# Patient Record
Sex: Male | Born: 1960 | Race: White | Hispanic: No | Marital: Married | Smoking: Never smoker
Health system: Southern US, Community
[De-identification: ages and names within clinical notes are randomized; demographics above are authoritative.]

## PROBLEM LIST (undated history)

## (undated) DIAGNOSIS — D869 Sarcoidosis, unspecified: Secondary | ICD-10-CM

## (undated) DIAGNOSIS — G4733 Obstructive sleep apnea (adult) (pediatric): Secondary | ICD-10-CM

## (undated) DIAGNOSIS — T148XXA Other injury of unspecified body region, initial encounter: Secondary | ICD-10-CM

## (undated) DIAGNOSIS — IMO0001 Reserved for inherently not codable concepts without codable children: Secondary | ICD-10-CM

## (undated) DIAGNOSIS — Z87442 Personal history of urinary calculi: Secondary | ICD-10-CM

## (undated) DIAGNOSIS — G43909 Migraine, unspecified, not intractable, without status migrainosus: Secondary | ICD-10-CM

## (undated) DIAGNOSIS — N200 Calculus of kidney: Secondary | ICD-10-CM

## (undated) DIAGNOSIS — F909 Attention-deficit hyperactivity disorder, unspecified type: Secondary | ICD-10-CM

## (undated) DIAGNOSIS — I10 Essential (primary) hypertension: Secondary | ICD-10-CM

## (undated) DIAGNOSIS — T8859XA Other complications of anesthesia, initial encounter: Secondary | ICD-10-CM

## (undated) DIAGNOSIS — Z531 Procedure and treatment not carried out because of patient's decision for reasons of belief and group pressure: Secondary | ICD-10-CM

## (undated) DIAGNOSIS — T4145XA Adverse effect of unspecified anesthetic, initial encounter: Secondary | ICD-10-CM

## (undated) HISTORY — DX: Migraine, unspecified, not intractable, without status migrainosus: G43.909

## (undated) HISTORY — DX: Procedure and treatment not carried out because of patient's decision for reasons of belief and group pressure: Z53.1

## (undated) HISTORY — PX: KIDNEY STONE SURGERY: SHX686

## (undated) HISTORY — PX: KNEE SURGERY: SHX244

## (undated) HISTORY — PX: OTHER SURGICAL HISTORY: SHX169

## (undated) HISTORY — DX: Other injury of unspecified body region, initial encounter: T14.8XXA

## (undated) HISTORY — DX: Obstructive sleep apnea (adult) (pediatric): G47.33

## (undated) HISTORY — DX: Attention-deficit hyperactivity disorder, unspecified type: F90.9

## (undated) HISTORY — DX: Calculus of kidney: N20.0

## (undated) HISTORY — PX: HERNIA REPAIR: SHX51

## (undated) HISTORY — DX: Reserved for inherently not codable concepts without codable children: IMO0001

## (undated) HISTORY — DX: Sarcoidosis, unspecified: D86.9

## (undated) HISTORY — PX: COLONOSCOPY: SHX5424

---

## 2004-12-19 HISTORY — PX: BRONCHOSCOPY: SUR163

## 2005-08-17 ENCOUNTER — Ambulatory Visit: Payer: Self-pay | Admitting: Internal Medicine

## 2005-09-07 ENCOUNTER — Ambulatory Visit: Payer: Self-pay | Admitting: Pulmonary Disease

## 2005-09-19 ENCOUNTER — Ambulatory Visit: Payer: Self-pay | Admitting: Cardiology

## 2005-09-26 ENCOUNTER — Ambulatory Visit: Payer: Self-pay | Admitting: Pulmonary Disease

## 2005-09-28 ENCOUNTER — Ambulatory Visit (HOSPITAL_COMMUNITY): Admission: RE | Admit: 2005-09-28 | Discharge: 2005-09-28 | Payer: Self-pay | Admitting: Pulmonary Disease

## 2005-09-28 ENCOUNTER — Encounter (INDEPENDENT_AMBULATORY_CARE_PROVIDER_SITE_OTHER): Payer: Self-pay | Admitting: Specialist

## 2005-10-17 ENCOUNTER — Ambulatory Visit: Payer: Self-pay | Admitting: Pulmonary Disease

## 2005-10-26 ENCOUNTER — Ambulatory Visit: Payer: Self-pay | Admitting: Pulmonary Disease

## 2006-01-25 ENCOUNTER — Ambulatory Visit: Payer: Self-pay | Admitting: Pulmonary Disease

## 2006-03-22 ENCOUNTER — Emergency Department (HOSPITAL_COMMUNITY): Admission: EM | Admit: 2006-03-22 | Discharge: 2006-03-22 | Payer: Self-pay | Admitting: *Deleted

## 2006-05-05 ENCOUNTER — Ambulatory Visit: Payer: Self-pay | Admitting: Pulmonary Disease

## 2006-09-27 ENCOUNTER — Ambulatory Visit: Payer: Self-pay | Admitting: Pulmonary Disease

## 2006-12-04 ENCOUNTER — Ambulatory Visit: Payer: Self-pay | Admitting: Pulmonary Disease

## 2006-12-11 ENCOUNTER — Ambulatory Visit: Payer: Self-pay | Admitting: Family Medicine

## 2006-12-13 ENCOUNTER — Ambulatory Visit: Payer: Self-pay | Admitting: Internal Medicine

## 2007-02-21 ENCOUNTER — Ambulatory Visit: Payer: Self-pay | Admitting: Pulmonary Disease

## 2007-03-08 ENCOUNTER — Ambulatory Visit: Payer: Self-pay | Admitting: Cardiology

## 2007-04-03 ENCOUNTER — Ambulatory Visit: Payer: Self-pay | Admitting: Internal Medicine

## 2007-04-17 ENCOUNTER — Ambulatory Visit: Payer: Self-pay | Admitting: Pulmonary Disease

## 2007-05-08 ENCOUNTER — Ambulatory Visit: Payer: Self-pay | Admitting: Pulmonary Disease

## 2007-06-11 ENCOUNTER — Ambulatory Visit: Payer: Self-pay | Admitting: Pulmonary Disease

## 2007-08-30 DIAGNOSIS — D869 Sarcoidosis, unspecified: Secondary | ICD-10-CM

## 2007-08-30 DIAGNOSIS — G43909 Migraine, unspecified, not intractable, without status migrainosus: Secondary | ICD-10-CM

## 2007-08-30 HISTORY — DX: Migraine, unspecified, not intractable, without status migrainosus: G43.909

## 2007-09-03 ENCOUNTER — Ambulatory Visit: Payer: Self-pay | Admitting: Internal Medicine

## 2007-09-03 DIAGNOSIS — R9431 Abnormal electrocardiogram [ECG] [EKG]: Secondary | ICD-10-CM

## 2007-09-03 DIAGNOSIS — Z87442 Personal history of urinary calculi: Secondary | ICD-10-CM | POA: Insufficient documentation

## 2007-09-03 DIAGNOSIS — F988 Other specified behavioral and emotional disorders with onset usually occurring in childhood and adolescence: Secondary | ICD-10-CM | POA: Insufficient documentation

## 2007-09-03 DIAGNOSIS — R030 Elevated blood-pressure reading, without diagnosis of hypertension: Secondary | ICD-10-CM | POA: Insufficient documentation

## 2007-09-06 ENCOUNTER — Ambulatory Visit: Payer: Self-pay | Admitting: Family Medicine

## 2007-09-07 ENCOUNTER — Ambulatory Visit: Payer: Self-pay | Admitting: Internal Medicine

## 2007-09-08 ENCOUNTER — Encounter: Payer: Self-pay | Admitting: Internal Medicine

## 2007-09-08 ENCOUNTER — Ambulatory Visit: Payer: Self-pay | Admitting: Family Medicine

## 2007-09-12 ENCOUNTER — Ambulatory Visit: Payer: Self-pay

## 2007-09-12 ENCOUNTER — Encounter: Payer: Self-pay | Admitting: Internal Medicine

## 2007-09-14 ENCOUNTER — Encounter: Payer: Self-pay | Admitting: Internal Medicine

## 2007-10-04 DIAGNOSIS — J310 Chronic rhinitis: Secondary | ICD-10-CM | POA: Insufficient documentation

## 2007-10-08 ENCOUNTER — Ambulatory Visit: Payer: Self-pay | Admitting: Pulmonary Disease

## 2007-10-10 ENCOUNTER — Ambulatory Visit: Payer: Self-pay | Admitting: Internal Medicine

## 2007-10-31 ENCOUNTER — Ambulatory Visit: Payer: Self-pay | Admitting: Internal Medicine

## 2007-11-23 ENCOUNTER — Ambulatory Visit: Payer: Self-pay | Admitting: Internal Medicine

## 2007-11-23 DIAGNOSIS — L609 Nail disorder, unspecified: Secondary | ICD-10-CM | POA: Insufficient documentation

## 2007-11-30 LAB — CONVERTED CEMR LAB
Albumin: 4.2 g/dL (ref 3.5–5.2)
Alkaline Phosphatase: 61 units/L (ref 39–117)
Basophils Absolute: 0 10*3/uL (ref 0.0–0.1)
Bilirubin, Direct: 0.3 mg/dL (ref 0.0–0.3)
Chloride: 99 meq/L (ref 96–112)
Cholesterol: 206 mg/dL (ref 0–200)
Creatinine, Ser: 0.9 mg/dL (ref 0.4–1.5)
GFR calc Af Amer: 118 mL/min
Glucose, Bld: 71 mg/dL (ref 70–99)
Hemoglobin: 15.9 g/dL (ref 13.0–17.0)
MCV: 90.2 fL (ref 78.0–100.0)
Neutro Abs: 3.7 10*3/uL (ref 1.4–7.7)
Platelets: 310 10*3/uL (ref 150–400)
Sodium: 139 meq/L (ref 135–145)
Total CHOL/HDL Ratio: 4.2
Triglycerides: 102 mg/dL (ref 0–149)
WBC: 5.5 10*3/uL (ref 4.5–10.5)

## 2007-12-03 ENCOUNTER — Telehealth: Payer: Self-pay | Admitting: Internal Medicine

## 2008-01-24 ENCOUNTER — Telehealth: Payer: Self-pay | Admitting: Internal Medicine

## 2008-04-18 ENCOUNTER — Telehealth: Payer: Self-pay | Admitting: Internal Medicine

## 2008-06-19 ENCOUNTER — Encounter: Payer: Self-pay | Admitting: Internal Medicine

## 2008-07-04 ENCOUNTER — Ambulatory Visit: Payer: Self-pay | Admitting: Internal Medicine

## 2008-07-04 ENCOUNTER — Telehealth: Payer: Self-pay | Admitting: Internal Medicine

## 2008-07-04 DIAGNOSIS — R29898 Other symptoms and signs involving the musculoskeletal system: Secondary | ICD-10-CM

## 2008-08-06 ENCOUNTER — Encounter: Payer: Self-pay | Admitting: Internal Medicine

## 2008-08-28 ENCOUNTER — Telehealth: Payer: Self-pay | Admitting: Internal Medicine

## 2008-09-02 ENCOUNTER — Ambulatory Visit: Payer: Self-pay | Admitting: Internal Medicine

## 2008-10-09 ENCOUNTER — Telehealth: Payer: Self-pay | Admitting: *Deleted

## 2008-10-21 ENCOUNTER — Telehealth (INDEPENDENT_AMBULATORY_CARE_PROVIDER_SITE_OTHER): Payer: Self-pay | Admitting: *Deleted

## 2008-10-24 ENCOUNTER — Ambulatory Visit: Payer: Self-pay | Admitting: Pulmonary Disease

## 2008-11-26 ENCOUNTER — Telehealth: Payer: Self-pay | Admitting: Internal Medicine

## 2008-12-23 ENCOUNTER — Emergency Department (HOSPITAL_COMMUNITY): Admission: EM | Admit: 2008-12-23 | Discharge: 2008-12-23 | Payer: Self-pay | Admitting: Emergency Medicine

## 2009-01-04 ENCOUNTER — Emergency Department (HOSPITAL_COMMUNITY): Admission: EM | Admit: 2009-01-04 | Discharge: 2009-01-04 | Payer: Self-pay | Admitting: Emergency Medicine

## 2009-01-16 ENCOUNTER — Telehealth: Payer: Self-pay | Admitting: *Deleted

## 2009-02-26 ENCOUNTER — Ambulatory Visit: Payer: Self-pay | Admitting: Internal Medicine

## 2009-05-11 ENCOUNTER — Telehealth: Payer: Self-pay | Admitting: Internal Medicine

## 2009-07-15 ENCOUNTER — Telehealth: Payer: Self-pay | Admitting: Internal Medicine

## 2009-09-09 ENCOUNTER — Telehealth: Payer: Self-pay | Admitting: Internal Medicine

## 2009-11-05 ENCOUNTER — Telehealth: Payer: Self-pay | Admitting: *Deleted

## 2009-11-10 ENCOUNTER — Ambulatory Visit: Payer: Self-pay | Admitting: Internal Medicine

## 2010-01-04 ENCOUNTER — Ambulatory Visit: Payer: Self-pay | Admitting: Pulmonary Disease

## 2010-01-12 ENCOUNTER — Telehealth: Payer: Self-pay | Admitting: Internal Medicine

## 2010-01-28 ENCOUNTER — Ambulatory Visit: Payer: Self-pay | Admitting: Family Medicine

## 2010-02-24 ENCOUNTER — Ambulatory Visit: Payer: Self-pay | Admitting: Pulmonary Disease

## 2010-03-03 ENCOUNTER — Ambulatory Visit: Payer: Self-pay | Admitting: Internal Medicine

## 2010-03-03 LAB — CONVERTED CEMR LAB
Albumin: 4.3 g/dL (ref 3.5–5.2)
BUN: 14 mg/dL (ref 6–23)
Basophils Absolute: 0 10*3/uL (ref 0.0–0.1)
Basophils Relative: 0.1 % (ref 0.0–3.0)
Bilirubin, Direct: 0.1 mg/dL (ref 0.0–0.3)
Blood in Urine, dipstick: NEGATIVE
Chloride: 106 meq/L (ref 96–112)
Direct LDL: 128.4 mg/dL
Eosinophils Absolute: 0 10*3/uL (ref 0.0–0.7)
Glucose, Urine, Semiquant: NEGATIVE
HCT: 47.9 % (ref 39.0–52.0)
Lymphs Abs: 1 10*3/uL (ref 0.7–4.0)
MCHC: 33.1 g/dL (ref 30.0–36.0)
Monocytes Relative: 5.6 % (ref 3.0–12.0)
Neutrophils Relative %: 81.9 % — ABNORMAL HIGH (ref 43.0–77.0)
Potassium: 4.2 meq/L (ref 3.5–5.1)
Sodium: 144 meq/L (ref 135–145)
Specific Gravity, Urine: 1.015
Total CHOL/HDL Ratio: 3
Total Protein: 7 g/dL (ref 6.0–8.3)
Triglycerides: 112 mg/dL (ref 0.0–149.0)
pH: 7

## 2010-03-10 ENCOUNTER — Ambulatory Visit: Payer: Self-pay | Admitting: Internal Medicine

## 2010-03-15 ENCOUNTER — Ambulatory Visit: Payer: Self-pay | Admitting: Pulmonary Disease

## 2010-04-19 ENCOUNTER — Ambulatory Visit: Payer: Self-pay | Admitting: Pulmonary Disease

## 2010-04-19 ENCOUNTER — Telehealth: Payer: Self-pay | Admitting: Pulmonary Disease

## 2010-05-07 ENCOUNTER — Emergency Department (HOSPITAL_COMMUNITY): Admission: EM | Admit: 2010-05-07 | Discharge: 2010-05-07 | Payer: Self-pay | Admitting: Emergency Medicine

## 2010-05-10 ENCOUNTER — Emergency Department (HOSPITAL_COMMUNITY): Admission: EM | Admit: 2010-05-10 | Discharge: 2010-05-10 | Payer: Self-pay | Admitting: Emergency Medicine

## 2010-05-18 ENCOUNTER — Ambulatory Visit: Payer: Self-pay | Admitting: Pulmonary Disease

## 2010-05-18 DIAGNOSIS — G4733 Obstructive sleep apnea (adult) (pediatric): Secondary | ICD-10-CM | POA: Insufficient documentation

## 2010-05-20 ENCOUNTER — Telehealth (INDEPENDENT_AMBULATORY_CARE_PROVIDER_SITE_OTHER): Payer: Self-pay | Admitting: *Deleted

## 2010-05-24 ENCOUNTER — Ambulatory Visit (HOSPITAL_BASED_OUTPATIENT_CLINIC_OR_DEPARTMENT_OTHER): Admission: RE | Admit: 2010-05-24 | Discharge: 2010-05-24 | Payer: Self-pay | Admitting: Urology

## 2010-06-01 ENCOUNTER — Telehealth: Payer: Self-pay | Admitting: *Deleted

## 2010-06-15 ENCOUNTER — Ambulatory Visit: Payer: Self-pay | Admitting: Pulmonary Disease

## 2010-07-02 ENCOUNTER — Encounter: Payer: Self-pay | Admitting: Pulmonary Disease

## 2010-07-07 ENCOUNTER — Ambulatory Visit: Payer: Self-pay | Admitting: Pulmonary Disease

## 2010-07-13 ENCOUNTER — Telehealth: Payer: Self-pay | Admitting: Pulmonary Disease

## 2010-07-13 ENCOUNTER — Ambulatory Visit: Payer: Self-pay | Admitting: Pulmonary Disease

## 2010-07-14 LAB — CONVERTED CEMR LAB
ALT: 24 units/L (ref 0–53)
Albumin: 4.1 g/dL (ref 3.5–5.2)
Creatinine, Ser: 1.1 mg/dL (ref 0.4–1.5)
HCT: 40.4 % (ref 39.0–52.0)
Hemoglobin: 14.2 g/dL (ref 13.0–17.0)
Lymphs Abs: 1.1 10*3/uL (ref 0.7–4.0)
Neutrophils Relative %: 66.2 % (ref 43.0–77.0)
Platelets: 314 10*3/uL (ref 150.0–400.0)
Potassium: 4.2 meq/L (ref 3.5–5.1)
RBC: 4.48 M/uL (ref 4.22–5.81)
RDW: 13.3 % (ref 11.5–14.6)
Total Protein: 6.6 g/dL (ref 6.0–8.3)
WBC: 5.7 10*3/uL (ref 4.5–10.5)

## 2010-07-20 ENCOUNTER — Telehealth: Payer: Self-pay | Admitting: Internal Medicine

## 2010-08-24 ENCOUNTER — Telehealth: Payer: Self-pay | Admitting: Internal Medicine

## 2010-08-24 ENCOUNTER — Ambulatory Visit: Payer: Self-pay | Admitting: Pulmonary Disease

## 2010-09-22 ENCOUNTER — Telehealth: Payer: Self-pay | Admitting: Internal Medicine

## 2010-10-27 ENCOUNTER — Telehealth: Payer: Self-pay | Admitting: Internal Medicine

## 2010-10-28 ENCOUNTER — Ambulatory Visit: Payer: Self-pay | Admitting: Internal Medicine

## 2010-11-28 ENCOUNTER — Observation Stay (HOSPITAL_COMMUNITY)
Admission: EM | Admit: 2010-11-28 | Discharge: 2010-11-30 | Payer: Self-pay | Source: Home / Self Care | Attending: Internal Medicine | Admitting: Internal Medicine

## 2010-11-29 ENCOUNTER — Telehealth: Payer: Self-pay | Admitting: Pulmonary Disease

## 2010-11-29 ENCOUNTER — Encounter: Payer: Self-pay | Admitting: Internal Medicine

## 2010-11-29 ENCOUNTER — Encounter: Payer: Self-pay | Admitting: Pulmonary Disease

## 2010-11-29 ENCOUNTER — Encounter (INDEPENDENT_AMBULATORY_CARE_PROVIDER_SITE_OTHER): Payer: Self-pay | Admitting: Internal Medicine

## 2010-12-01 ENCOUNTER — Telehealth: Payer: Self-pay | Admitting: Pulmonary Disease

## 2010-12-14 ENCOUNTER — Telehealth: Payer: Self-pay | Admitting: Internal Medicine

## 2011-01-18 NOTE — Assessment & Plan Note (Signed)
Summary: rov 2-3 months///kp   Primary Provider/Referring Provider:  [name]  CC:  rov F/U 2-3, mos, no new problems, cough is better since last visit with Dr Shelle Iron about 3 weeks ago, and at that time prednisine dosage was adjusted.Barry Silva  History of Present Illness: 50 yo male with cough in setting of sarcoidosis on chronic prednisone.  He has done better since he had his prednisone dose increased.  He is now on prednisone 7.5 mg once daily, and has been for the past 5 days.  He still has a cough, but not as bad as before.  He has been sleeping better also.  He denies sinus problems, sore throat, fever, wheeze, sputum, hemoptysis, chest pain, gland swelling, or skin rashes.  He did have the flu since he saw me last.  This was associated with several canker sores in his mouth.  He has not had this happen before, and denies having these types of sores anywhere else on his body.  He was treated with tamiflu and magic mouthwash by primary care from February 10.  Labs from March 16 were reviewed.  Current Medications (verified): 1)  Prednisone 2.5 Mg Tabs (Prednisone) .... One By Mouth Every Other Day 2)  Adderall Xr 20 Mg  Cp24 (Amphetamine-Dextroamphetamine) .... 2 By Mouth Once Daily 3)  Prednisone 7.5 Mg Tabs (Prednisone) .... As Directed  Allergies (verified): No Known Drug Allergies  Past History:  Past Medical History: migraine headache sarcoidosis bronchoscopy 2006 [employer] Witness Nephrolithiasis, hx of  episode Jan 2010.  Calcium stones ADD formal evaluaton  CONSULTANTS  [name]  Past Surgical History: Reviewed history from 11/23/2007 and no changes required. 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   Nose:  no deformity, discharge, inflammation, or lesions Mouth:  no deformity or lesions Neck:  No deformities, masses, or tenderness noted. Lungs:  clear bilaterally to auscultation and percussion Heart:  regular rate and rhythm, S1, S2 without murmurs, rubs, gallops, or clicks Extremities:  no edema or cyanosis Cervical Nodes:  no significant adenopathy   Impression & Recommendations:  Problem # 1:  COUGH, CHRONIC (ICD-786.2)  He has improved with increase in prednisone.  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  Problem # 2:  SARCOIDOSIS (ICD-135) Now that his cough has improved will continue to see if we can taper down his prednisone.  Explained again that the objective is to taper prednisone off completely.  If not able to do this explained that he may need to consider alternative therapies to prednisone.  He has been reluctant to do this in the past.  Medications Added to Medication List This Visit: 1)  Prednisone 5 Mg Tabs (Prednisone) .Barry KitchenMarland KitchenMarland Silva  One by mouth once daily for 2 weeks, then one by mouth every other day  Complete Medication List: 1)  Prednisone 5 Mg Tabs (Prednisone) .... One by mouth once daily for 2 weeks, then one by mouth every other day 2)  Adderall Xr 20 Mg Cp24 (Amphetamine-dextroamphetamine) .... 2 by mouth once daily  Other Orders: Est. Patient Level III ([address])  Patient Instructions: 1)  Prednisone 7.5 mg once daily for 2 days, then 5 mg once daily for 2 weeks, then 5 mg every other day until next visit 2)  Follow up in 4 weeks Prescriptions: PREDNISONE 5 MG TABS (PREDNISONE) one by mouth once daily for 2 weeks, then one by mouth every other day   #30 x 1   Entered and Authorized by:   Coralyn Helling MD   Signed by:   Coralyn Helling MD on 03/15/2010   Method used:   Electronically to        [facility]. [phone]* (retail)       [phone]       [place], [place]  [address]       Ph: [phone]       Fax: [phone]   RxID:   [id number]

## 2011-01-18 NOTE — Assessment & Plan Note (Signed)
Summary: Barry Silva ONLY/CB   Primary Provider/Referring Provider:  [name]   History of Present Illness: Apnealink from 07/01/10.  AHI 1, RDI 7.  Oxygen nadir 83%.  Spent 1% of test time with SpO2 < 88%.  Allergies: No Known Drug Allergies  Past History:  Past Medical History: Migraine headache Sarcoidosis bronchoscopy 2006 on chronic prednisone      - MTX started 04/19/10 [employer] Witness Nephrolithiasis, hx of  episode Jan 2010.  Calcium stones ADD formal evaluaton Mild OSA      - Apnealink 07/01/10 RDI 7, O2 nadir 83%  CONSULTANTS  Talajah Slimp   [place]   Impression & Recommendations:  Problem # 1:  OBSTRUCTIVE SLEEP APNEA (ICD-327.23) Reviewe apnealink results over the phone.  He has mild sleep apnea.  Advised trying positional therapy first.  Also explained the CPAP, oral appliance, or surgery options.  He would be a good candidate for an oral appliance.  He will think about these options and let me know what he decides.  Appended Document: APENA LINK ONLY/CB

## 2011-01-18 NOTE — Assessment & Plan Note (Signed)
Summary: 4 weeks/apc   Primary Provider/Referring Provider:  [name]   History of Present Illness: 50 yo male with cough in setting of sarcoidosis on chronic prednisone.  He had lithotripsy done recently.  As a result he was not able to do his home sleep test.  He continues on methotrexate.  His cough has improved.  He is not having chest congestion, wheeze, sore throat, sinus congestion, fever, sputum, or skin rash.  He had a canker sore on his tongue a few weeks ago, but this resolved.  He says he is prone to getting these.  He continues to have trouble with his sleep.    Current Medications (verified): 1)  Adderall Xr 20 Mg  Cp24 (Amphetamine-Dextroamphetamine) .... 2 By Mouth Once Daily 2)  Prednisone 5 Mg Tabs (Prednisone) .[name].. 1 By Mouth Every Other Day For Two Weeks, Then 1/2 Pill Every Other Day 3)  Methotrexate 2.5 Mg Tabs (Methotrexate Sodium) .... Three Pills Once Per Week 4)  Folic Acid 1 Mg Tabs (Folic Acid) .... One By Mouth Once Daily  Allergies (verified): No Known Drug Allergies  Comments:  Nurse/Medical Assistant: The patient's medications were reviewed with the patient and were updated in the Medication List.  Past History:  Past Medical History: Reviewed history from 05/18/2010 and no changes required. Migraine headache Sarcoidosis bronchoscopy 2006 on chronic prednisone      - MTX started 04/19/10 [employer]'s Witness Nephrolithiasis, hx of  episode Jan 2010.  Calcium stones ADD formal evaluaton  CONSULTANTS  [name]  Past Surgical History: Reviewed history from 11/23/2007 and no changes required. knee surgery  1985, 1986 fx femur  1984 residual nerve damage left leg kidney stones Hernia repair age 57   Vital Signs:  Patient profile:   50 year old male Height:      67.5 inches Weight:      145 pounds O2 Sat:      96 % on Room air Temp:     98.1 degrees F oral Pulse rate:   97 / minute BP sitting:   110 / 82  (right  arm) Cuff size:   regular  Vitals Entered By: Denna Haggard, CMA (June 15, 2010 2:04 PM)  O2 Sat at Rest %:  96% O2 Flow:  Room air  Reason for Visit 4 week rov, pt was unable to obtain Apnea Link due to Kidney stone, but is willing to still do an apnea link eval .  Physical Exam  General:  normal appearance and healthy appearing.   Nose:  no deformity, discharge, inflammation, or lesions Mouth:  MP 3, retrognathic Neck:  no JVD.   Lungs:  clear bilaterally to auscultation and percussion Heart:  regular rate and rhythm, S1, S2 without murmurs, rubs, gallops, or clicks Extremities:  no edema or cyanosis Cervical Nodes:  no significant adenopathy   Impression & Recommendations:  Problem # 1:  SARCOIDOSIS (ICD-135) Will continue to wean off prednisone.  He is to continue current dose of methotrexate.  Advised to call if he develops recurrence of mouth sores.  Problem # 2:  SNORING (ICD-786.09)  Will reschedule his home sleep test.  Medications Added to Medication List This Visit: 1)  Prednisone 2.5 Mg Tabs (Prednisone) .... One every other day for two weeks, then stop prednisone  Complete Medication List: 1)  Adderall Xr 20 Mg Cp24 (Amphetamine-dextroamphetamine) .... 2 by mouth once daily 2)  Prednisone 2.5 Mg Tabs (Prednisone) .... One every other day  for two weeks, then stop prednisone 3)  Methotrexate 2.5 Mg Tabs (Methotrexate sodium) .... Three pills once per week 4)  Folic Acid 1 Mg Tabs (Folic acid) .... One by mouth once daily  Other Orders: Est. Patient Level III ([address]) Sleep Disorder Referral (Sleep Disorder)  Patient Instructions: 1)  Prednisone 2.5 mg every other day for two weeks, then stop prednisone 2)  Will schedule home sleep test 3)  Follow up in 4 weeks

## 2011-01-18 NOTE — Progress Notes (Signed)
Summary: REQ FOR REFILL RX  Phone Note Call from Patient   Caller: Patient [phone] Reason for Call: Refill Medication Summary of Call: Pt called to adv that he needs a refill RX for med: Adderall XR 20 mg.  Pt can be reached at [phone] when same is ready for p/u.  Initial call taken by: Debbra Riding,  January 12, 2010 12:03 PM  Follow-up for Phone Call        Pt aware that rx is ready to pick up. Follow-up by: Romualdo Bolk, CMA (AAMA),  January 12, 2010 4:14 PM    Prescriptions: ADDERALL XR 20 MG  CP24 (AMPHETAMINE-DEXTROAMPHETAMINE) 2 by mouth once daily  #60 x 0   Entered by:   Romualdo Bolk, CMA (AAMA)   Authorized by:   [name]   Signed by:   Romualdo Bolk, CMA (AAMA) on 01/12/2010   Method used:   Print then Give to Patient   RxID:   [id number]

## 2011-01-18 NOTE — Assessment & Plan Note (Signed)
Summary: 4 weeks/apc   Visit Type:  Follow-up Primary Provider/Referring Provider:  [name]  CC:  Cough and sarcoid follow-up. The patient says his cough is better but still present..  History of Present Illness: 49 yo male with cough in setting of sarcoidosis on chronic prednisone.  His cough has improved since he was started on prednisone.  He has remained stable with prednisone taper, but still has some cough.  He denies fever, chest pain, sputum, hemoptysis, sore throat, or skin rashes.    Current Medications (verified): 1)  Prednisone 5 Mg Tabs (Prednisone) .[name].. 1 By Mouth Every Other Day 2)  Adderall Xr 20 Mg  Cp24 (Amphetamine-Dextroamphetamine) .... 2 By Mouth Once Daily  Allergies (verified): No Known Drug Allergies  Past History:  Past Medical History: Reviewed history from 03/15/2010 and no changes required. migraine headache sarcoidosis bronchoscopy 2006 [employer]hov[employer]'s Witness Nephrolithiasis, hx of  episode Jan 2010.  Calcium stones ADD formal evaluaton  CONSULTANTS  [name]  Past Surgical History: Reviewed history from 11/23/2007 and no changes required. knee surgery  1985, 1986 fx femur  1984 residual nerve damage left leg kidney stones Hernia repair age 68   Vital Signs:  Patient profile:   50 year old male Height:      67.5 inches (171.45 cm) Weight:      153.50 pounds (69.77 kg) BMI:     23.77 O2 Sat:      98 % on Room air Temp:     98.6 degrees F (37.00 degrees C) oral Pulse rate:   88 / minute BP sitting:   142 / 90  (left arm) Cuff size:   regular  Vitals Entered By: Michel Bickers CMA (Apr 19, 2010 3:11 PM)  O2 Sat at Rest %:  98 O2 Flow:  Room air  Physical Exam  General:  normal appearance and healthy appearing.   Nose:  no deformity, discharge, inflammation, or lesions Mouth:  no deformity or lesions Neck:  No deformities, masses, or tenderness noted. Lungs:  clear bilaterally to auscultation and percussion Heart:   regular rate and rhythm, S1, S2 without murmurs, rubs, gallops, or clicks Extremities:  no edema or cyanosis Cervical Nodes:  no significant adenopathy   Impression & Recommendations:  Problem # 1:  SARCOIDOSIS (ICD-135) He is prednisone dependent.  I explained to him the rationale for trying to add a steroid sparing agent, and he is agreeable to this.  Will start him on Methotrexate 7.5 mg per week with folic acid.  He is to continue with prednisone 5 mg every other day for now.  He had lab tests from March 2011 which were normal.  Advised him he will need to have frequent blood testing while on methotrexate.  Medications Added to Medication List This Visit: 1)  Prednisone 5 Mg Tabs (Prednisone) .[name].. 1 by mouth every other day 2)  Methotrexate 2.5 Mg Tabs (Methotrexate sodium) .... Three pills once per week 3)  Folic Acid 1 Mg Tabs (Folic acid) .... One by mouth once per week 4)  Folic Acid 1 Mg Tabs (Folic acid) .... One by mouth once daily  Complete Medication List: 1)  Prednisone 5 Mg Tabs (Prednisone) .[name].. 1 by mouth every other day 2)  Adderall Xr 20 Mg Cp24 (Amphetamine-dextroamphetamine) .... 2 by mouth once daily 3)  Methotrexate 2.5 Mg Tabs (Methotrexate sodium) .... Three pills once per week 4)  Folic Acid 1 Mg Tabs (Folic acid) .... One by mouth  once daily  Other Orders: Est. Patient Level III ([address])  Patient Instructions: 1)  Methotrexate 3 pills once per week 2)  Folic acid 1 mg once daily  3)  Follow up in 4 to 6 weeks Prescriptions: FOLIC ACID 1 MG TABS (FOLIC ACID) one by mouth once per week  #30 x 3   Entered and Authorized by:   Coralyn Helling MD   Signed by:   Coralyn Helling MD on 04/19/2010   Method used:   Electronically to        [facility]. [phone]* (retail)       [phone]       [place], [place]  [address]       Ph: [phone]       Fax: [phone]   RxID:   [id number] METHOTREXATE 2.5 MG TABS (METHOTREXATE  SODIUM) three pills once per week  #12 x 3   Entered and Authorized by:   Coralyn Helling MD   Signed by:   Coralyn Helling MD on 04/19/2010   Method used:   Electronically to        [facility]. [phone]* (retail)       [phone]       [place], [place]  [address]       Ph: [phone]       Fax: [phone]   RxID:   [id number]

## 2011-01-18 NOTE — Assessment & Plan Note (Signed)
Summary: flu like symptoms/inside of mouth sore & tender/sores on tong...   Vital Signs:  Patient profile:   50 year old male Temp:     101.1 degrees F oral BP sitting:   122 / 98  (left arm) Cuff size:   regular  Vitals Entered By: Sid Falcon LPN (January 28, 2010 2:27 PM) CC: Flu like symptoms, sores on tongue, swolen gums   History of Present Illness: Acute visit. Patient presents with acute onset of fever up to 101 late yesterday along with body aches. Mild intermittent headache. Denies cough. 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 Patient eating OK. No recent antibiotics. History of sarcoidosis and maintained on low dose prednisone. No history of thrush. Denies any nausea, vomiting, diarrhea, or rash  Allergies (verified): No Known Drug Allergies  Past History:  Past Medical History: Last updated: 02/26/2009 migraine headache sarcoidosis bronchoscopy 2006 Jehovah's Witness Nephrolithiasis, hx of  episode Jan 2010.  Calcium stones ADD formal evaluaton CONSULTANTS  [name]  Past Surgical History: Last updated: 11/23/2007 knee surgery  1985, 1986 fx femur  1984 residual nerve damage left leg kidney stones Hernia repair age 82   Social History: Last updated: 11/10/2009 Married Never Smoked [employer] h[employer] of   works for [employer]    PMH-FH-SH reviewed for relevance  Review of Systems      See HPI  Physical Exam  General:  Well-developed,well-nourished,in no acute distress; alert,appropriate and cooperative throughout examination Eyes:  pupils equal, pupils round, pupils reactive to light, and no injection.   Ears:  External ear exam shows no significant lesions or deformities.  Otoscopic examination reveals clear canals, tympanic membranes are intact bilaterally without bulging, retraction, inflammation or discharge. Hearing is grossly normal bilaterally. Nose:  External nasal examination shows no deformity or  inflammation. Nasal mucosa are pink and moist without lesions or exudates. Mouth:  patient several small scattered aphthous ulcers buccal mucosa, left distal tongue. No evidence for thrush. No exudate Neck:  No deformities, masses, or tenderness noted. Lungs:  Normal respiratory effort, chest expands symmetrically. Lungs are clear to auscultation, no crackles or wheezes. Heart:  Normal rate and regular rhythm. S1 and S2 normal without gallop, murmur, click, rub or other extra sounds. Skin:  no rash   Impression & Recommendations:  Problem # 1:  VIRAL INFECTION (ICD-079.99) suspect probable influenza. Given underlying lung disease and sarcoidosis start Tamiflu. Magic mouthwash for aphthous ulcers.  Complete Medication List: 1)  Prednisone 2.5 Mg Tabs (Prednisone) .... One by mouth every other day 2)  Adderall Xr 20 Mg Cp24 (Amphetamine-dextroamphetamine) .... 2 by mouth once daily 3)  Tamiflu 75 Mg Caps (Oseltamivir phosphate) .... One by mouth two times a day for 5 days  Patient Instructions: 1)  Used Magic mouthwash 1 tablespoon swish gargle and spit several times daily as needed for mouth discomfort 2)  Get plenty of rest, drink lots of clear liquids, and use Tylenol or Ibuprofen for fever and comfort. Return in 7-10 days if you're not better: sooner if you'er feeling worse.  Prescriptions: TAMIFLU 75 MG CAPS (OSELTAMIVIR PHOSPHATE) one by mouth two times a day for 5 days  #10 x 0   Entered and Authorized by:   Evelena Peat MD   Signed by:   Evelena Peat MD on 01/28/2010   Method used:   Electronically to        [facility]. [phone]* (retail)  [address]       [place], [place]  [address]       Ph: [phone]       Fax: [phone]   RxID:   [id number]

## 2011-01-18 NOTE — Progress Notes (Signed)
Summary: pharm calling- clarification needed  Phone Note From Pharmacy   Caller: [facility]. #[id number]* Call For: Barry Silva  Summary of Call: escribe rx for FOLIC ACID 5mg  caps: this comes in 8, 4, 1 or 0.4mg  caps. call [phone] Initial call taken by: Tivis Ringer, CNA,  July 13, 2010 3:47 PM  Follow-up for Phone Call        VS---the pharmacy is stating that the rx sent in earlier only comes in the mg as in above note.  please advise of different mg for med.  thanks Randell Loop CMA  July 13, 2010 4:09 PM   Additional Follow-up for Phone Call Additional follow up Details #1::        can change to 4 mg one by mouth once daily dispense 30 with 3 refills Additional Follow-up by: Coralyn Helling MD,  July 13, 2010 4:23 PM    Additional Follow-up for Phone Call Additional follow up Details #2::    called the pharmacy to give them the order for the folic acid 4 mg daily---the pharmacy called back and stated that  the otc folic acid comes in 0.4 mg and 0.8mg   and the rx strength is 1 mg---please advise if you want pt to have folic acid 1mg    4 tabs daily? Randell Loop CMA  July 13, 2010 4:30 PM   Additional Follow-up for Phone Call Additional follow up Details #3:: Details for Additional Follow-up Action Taken: yes Additional Follow-up by: Coralyn Helling MD,  July 13, 2010 4:40 PM  New/Updated Medications: FOLIC ACID 1 MG TABS (FOLIC ACID) take 4 tablets by mouth once daily Prescriptions: FOLIC ACID 1 MG TABS (FOLIC ACID) take 4 tablets by mouth once daily  #120 x 3   Entered by:   Randell Loop CMA   Authorized by:   Coralyn Helling MD   Signed by:   Randell Loop CMA on 07/13/2010   Method used:   Electronically to        [facility]. [phone]* (retail)       [phone]       [place], [place]  [address]       Ph: [phone]       Fax: [phone]   RxID:   [id number]

## 2011-01-18 NOTE — Assessment & Plan Note (Signed)
Summary: CPX//LH   Vital Signs:  Patient profile:   50 year old male Height:      67.5 inches Weight:      150 pounds BMI:     23.23 Pulse rate:   100 / minute BP sitting:   140 / 100  (left arm) Cuff size:   regular  Vitals Entered By: Romualdo Bolk, CMA Duncan Dull) (March 10, 2010 2:17 PM)  Serial Vital Signs/Assessments:  Time      Position  BP       Pulse  Resp  Temp     By                     140/90                         [name]                     134/88                         [name]  Comments: right arm reg cuff  By: [name]  large cuff sitting  By: [name]   CC: CPX   History of Present Illness: Barry Silva comesin today for   for preventive visit  since last visit  he has done fairly  well . Had flu like illness  and resp symptom  inregard to sarcoid is back on prednisone with help of resp symptom . BP readings 130/90 readings.  or lower  recent stress with business. Eats a lot of salty foods. no edema. ADD: meds still help and denies cv pulm side effect   needs refill of med  No new injury    Preventive Care Screening  Prior Values:    Last Tetanus Booster:  Tdap (09/07/2007)   Preventive Screening-Counseling & Management  Alcohol-Tobacco     Alcohol drinks/day: 1     Alcohol type: wine     Smoking Status: never  Caffeine-Diet-Exercise     Caffeine use/day: 0     Does Patient Exercise: yes     Type of exercise: work related     Exercise (avg: min/session): 30-60     Times/week: 6  Hep-HIV-STD-Contraception     Dental Visit-last 6 months yes     Sun Exposure-Excessive: no  Safety-Violence-Falls     Seat Belt Use: yes     Firearms in the Home: no firearms in the home     Smoke Detectors: yes  EKG  Procedure date:  03/10/2010  Findings:       sinus rhythm with rate of:  81 Septal ST elevation  Current Medications (verified): 1)  Prednisone 2.5 Mg Tabs (Prednisone) .... One By Mouth  Every Other Day 2)  Adderall Xr 20 Mg  Cp24 (Amphetamine-Dextroamphetamine) .... 2 By Mouth Once Daily 3)  Prednisone 5 Mg Tabs (Prednisone) .... As Directed  Allergies (verified): No Known Drug Allergies  Past History:  Past medical, surgical, family and social histories (including risk factors) reviewed, and no changes noted (except as noted below).  Past Medical History: Reviewed history from 02/26/2009 and no changes required. migraine headache sarcoidosis bronchoscopy 2006 Jehovah's Witness Nephrolithiasis, hx of  episode Jan 2010.  Calcium stones ADD formal evaluaton [facility]  [name]  Past Surgical History: Reviewed history from 11/23/2007 and no changes  required. 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 Urology: Vernie Ammons   Family History: Reviewed history from 11/23/2007 and no changes required. negative for sudden cardiac death or premature heart disease  Social History: Reviewed history from 11/10/2009 and no changes required. 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   Physical Exam General Appearance: well developed, well nourished, no acute distress Eyes: conjunctiva and lids normal, PERRLA, EOMI,  WNL Ears,  Nose, Mouth, Throat: TM clear, nares clear, oral exam WNL Neck: supple, no lymphadenopathy, no thyromegaly, no JVD Respiratory: clear to auscultation and percussion, respiratory effort normal Cardiovascular: regular rate and rhythm, S1-S2, no murmur, rub or gallop, no bruits, peripheral pulses normal and symmetric, no cyanosis, clubbing, edema or varicosities Chest: no scars, masses, tenderness; no asymmetry, skin changes, nipple discharge, no gynecomastia   Gastrointestinal: soft, non-tender; no hepatosplenomegaly, masses; active bowel sounds all quadrants,  Genitourinary: nl  Lymphatic: no cervical, axillary or inguinal adenopathy Musculoskeletal: gait normal, muscle tone and strength WNL, no joint swelling, effusions, discoloration, crepitus  Skin: clear, good turgor, color WNL, no rashes, lesions, or ulcerations Neurologic: normal mental status, normal reflexes, normal strength, sensation, and motion Psychiatric: alert; oriented to person, place and time Other Exam: EKG ns st elevation  V2  rate 81  nl intervals    Impression & Recommendations:  Problem # 1:  PREVENTIVE HEALTH CARE (ICD-V70.0)  Reviewed preventive care protocols, scheduled due services, and updated immunizations.  Orders: EKG w/ Interpretation (93000)  Problem # 2:  ELEVATED BLOOD PRESSURE WITHOUT DIAGNOSIS OF HYPERTENSION (ICD-796.2) monitor  and if persistent and progressive  may have to add meds  reports  can decrease sodium in diet quite a bit  Problem # 3:  ADD (ICD-314.00) Assessment: Unchanged continue   Problem # 4:  SARCOIDOSIS (ICD-135) under care  back on prednisone.   Complete Medication List: 1)  Prednisone 2.5 Mg Tabs (Prednisone) .... One by mouth every other day 2)  Adderall Xr 20 Mg Cp24 (Amphetamine-dextroamphetamine) .... 2 by mouth once daily 3)  Prednisone 5 Mg Tabs (Prednisone) .... As directed  Patient Instructions: 1)  Limit/decrease  your Sodium(salt)  intake .  2 gram  sodium or  less is most healthy  2)  This could help with your blood pressure. 3)  Check your  Blood Pressure regularly . below 140/90 is goal. 4)  call with Bp readings in a month  .  If ok continue to monitor and rov in 6 months Prescriptions: ADDERALL XR 20 MG  CP24 (AMPHETAMINE-DEXTROAMPHETAMINE) 2 by mouth once daily  #60 x 0   Entered by:   Romualdo Bolk, CMA (AAMA)   Authorized by:   [name]   Signed by:   Romualdo Bolk, CMA (AAMA) on 03/10/2010   Method used:   Print then Give to Patient   RxID:   [id number]

## 2011-01-18 NOTE — Progress Notes (Signed)
Summary: new rx  Phone Note Call from Patient Call back at [phone]   Caller: Patient Call For: [name] Summary of Call: needs new rx adderall xr 20 mg pt is almost out. Initial call taken by: Heron Sabins,  August 24, 2010 9:03 AM  Follow-up for Phone Call        Left message on machine that rx is ready to pick up. Follow-up by: Romualdo Bolk, CMA (AAMA),  August 24, 2010 4:21 PM    Prescriptions: ADDERALL XR 20 MG  CP24 (AMPHETAMINE-DEXTROAMPHETAMINE) 2 by mouth once daily  #60 x 0   Entered by:   Romualdo Bolk, CMA (AAMA)   Authorized by:   [name]   Signed by:   Romualdo Bolk, CMA (AAMA) on 08/24/2010   Method used:   Print then Give to Patient   RxID:   [id number]

## 2011-01-18 NOTE — Assessment & Plan Note (Signed)
Summary: [name] 4 WKS ///KP   Visit Type:  Follow-up Primary Provider/Referring Provider:  [name]  CC:  Sarcoid. The patient has not been taking his methotrexate every week.[name]  History of Present Illness: 50 yo male with cough in setting of sarcoidosis on chronic prednisone.  He took two dose of methotrexate.  He did notice an improvement in his cough.  He since developed a kidney stone.  As a result of this he forgot to take his methotrexate.  He noticed that his cough has gotten worse since he stopped methotrexate.  He remains on prednisone 5 mg every other day.  He has intermittent episodes of waking up from sleep with a choking sensation.  This only happens when his sleeping on his back.  He feels like he has saliva pooling in his throat.  He does snore intermittently.  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   Current Medications (verified): 1)  Prednisone 5 Mg Tabs (Prednisone) .[name].. 1 By Mouth Every Other Day 2)  Adderall Xr 20 Mg  Cp24 (Amphetamine-Dextroamphetamine) .... 2 By Mouth Once Daily 3)  Methotrexate 2.5 Mg Tabs (Methotrexate Sodium) .... Three Pills Once Per Week 4)  Folic Acid 1 Mg Tabs (Folic Acid) .... One By Mouth Once Daily  Allergies (verified): No Known Drug Allergies  Past History:  Past Medical History: Migraine headache Sarcoidosis bronchoscopy 2006 on chronic prednisone      - MTX started 04/19/10 Jehovah's Witness Nephrolithiasis, hx of  episode Jan 2010.  Calcium stones ADD formal evaluaton  [employer]  [name]  Past Surgical History: Reviewed history from 11/23/2007 and no changes required. knee surgery  1985, 1986 fx femur  1984 residual nerve damage left leg kidney stones Hernia repair age 40   Vital Signs:  Patient profile:   50 year old male Height:      67.5 inches (171.45 cm) Weight:      157 pounds (71.36 kg) BMI:     24.31 O2 Sat:      98 % on Room air Temp:      98.3 degrees F (36.83 degrees C) oral Pulse rate:   95 / minute BP sitting:   132 / 90  (left arm) Cuff size:   regular  Vitals Entered By: Michel Bickers CMA (May 18, 2010 5:09 PM)  O2 Sat at Rest %:  98 O2 Flow:  Room air CC: Sarcoid. The patient has not been taking his methotrexate every week. Comments Medications reviewed. Daytime phone verified. Michel Bickers CMA  May 18, 2010 5:09 PM   Physical Exam  General:  normal appearance and healthy appearing.   Nose:  no deformity, discharge, inflammation, or lesions Mouth:  MP 3, retrognathic Neck:  no JVD.   Lungs:  clear bilaterally to auscultation and percussion Heart:  regular rate and rhythm, S1, S2 without murmurs, rubs, gallops, or clicks Extremities:  no edema or cyanosis Cervical Nodes:  no significant adenopathy   Impression & Recommendations:  Problem # 1:  SARCOIDOSIS (ICD-135) He did notice improvement in his cough while on methotrexate.  I have advised him about the importance of maintaining compliance with is medications.    I will have him restart methotrexate.  I will then have him change prednisone to 2.5 mg every other day after he is on methotrexate for two weeks.  Problem # 2:  SNORING (ICD-786.09) His symptoms are suggestive of sleep  apnea.  I will arrange for a home sleep apnea test to further assess.  I explained that some of his symptoms of ADD could be related to sleep disordered breathing.  Problem # 3:  NEPHROLITHIASIS, HX OF (ICD-V13.01) I have advised him to discuss with urology whether his kidney stones could be related to calcium build up from his sarcoidosis.  Medications Added to Medication List This Visit: 1)  Prednisone 5 Mg Tabs (Prednisone) .[name].. 1 by mouth every other day for two weeks, then 1/2 pill every other day  Complete Medication List: 1)  Adderall Xr 20 Mg Cp24 (Amphetamine-dextroamphetamine) .... 2 by mouth once daily 2)  Prednisone 5 Mg Tabs (Prednisone) .[name].. 1 by mouth every  other day for two weeks, then 1/2 pill every other day 3)  Methotrexate 2.5 Mg Tabs (Methotrexate sodium) .... Three pills once per week 4)  Folic Acid 1 Mg Tabs (Folic acid) .... One by mouth once daily  Other Orders: Est. Patient Level IV (08657) Sleep Disorder Referral (Sleep Disorder)  Patient Instructions: 1)  Restart methotrexate with folic acid 2)  After two weeks change prednisone to 2.5 mg every other day 3)  Will arrange for sleep test at home 4)  Follow up in 4 weeks

## 2011-01-18 NOTE — Progress Notes (Signed)
Summary: prescription clarification  Phone Note From Pharmacy Call back at [phone]   Caller: [facility] Call For: Steed Kanaan  Summary of Call: Need clarification for folic acid 1mg  and methotrexate 2.5mg . Initial call taken by: Darletta Moll,  Apr 19, 2010 3:58 PM  Follow-up for Phone Call        called to check on folic acid, rx states folic acid once a week #30. per VS should be folic acid once daily. pharmacy advised.Carron Curie CMA  Apr 19, 2010 4:11 PM

## 2011-01-18 NOTE — Assessment & Plan Note (Signed)
Summary: rov ///kp   Primary Provider/Referring Provider:  [name]  CC:  Sarcoid follow-up. The patient has no complaints today.[name]  History of Present Illness: I saw Mr. Baeten in follow up for his chronic cough, and pulmonary sarcoid.  I have not seen Mr. Ogawa for one year.   He is on prednisone 5 mg every other day.  He does not have much of a cough.  He does not have much sputum.  His sinuses are okay.  He denies fever, sweats, chills, or hemoptysis.  He is farely active, and does not feel that his breathing causes any limitation on his activity.  He denies chest pain, abdominal pain, leg swelling, or skin rashes.  He continues to use vitamin C for his cough, and this helps.    Current Medications (verified): 1)  Prednisone 5 Mg Tabs (Prednisone) .[name].. 1 By Mouth Every Other Day 2)  Adderall Xr 20 Mg  Cp24 (Amphetamine-Dextroamphetamine) .... 2 By Mouth Once Daily  Allergies (verified): No Known Drug Allergies  Past History:  Past Medical History: Last updated: 02/26/2009 migraine headache sarcoidosis bronchoscopy 2006 Jehovah's Witness Nephrolithiasis, hx of  episode Jan 2010.  Calcium stones ADD formal evaluaton [employer]  [name]  Past Surgical History: Last updated: 11/23/2007 knee surgery  1985, 1986 fx femur  1984 residual nerve damage left leg kidney stones Hernia repair age 29   Vital Signs:  Patient profile:   50 year old male Height:      67.5 inches (171.45 cm) Weight:      158.38 pounds (71.99 kg) BMI:     24.53 O2 Sat:      97 % on Room air Temp:     98.1 degrees F (36.72 degrees C) oral Pulse rate:   92 / minute BP sitting:   132 / 100  (left arm) Cuff size:   regular  Vitals Entered By: Michel Bickers CMA (January 04, 2010 4:29 PM)  O2 Sat at Rest %:  97 O2 Flow:  Room air  Physical Exam  General:  normal appearance and healthy appearing.   Eyes:  PERRLA and EOMI.   Nose:  no deformity, discharge, inflammation, or  lesions Mouth:  no deformity or lesions Neck:  No deformities, masses, or tenderness noted. Lungs:  coarse breath sounds, faint wheeze right upper lung field Heart:  regular rate and rhythm, S1, S2 without murmurs, rubs, gallops, or clicks Abdomen:  Bowel sounds positive,abdomen soft and non-tender without masses, organomegaly or  noted. Extremities:  no clubbing, cyanosis, edema, or deformity noted Cervical Nodes:  no significant adenopathy   Impression & Recommendations:  Problem # 1:  SARCOIDOSIS (ICD-135) He has been doing reasonably well on prednisone.  He has been on this for some time.  I will try to decrease his dose to 2.5 mg every other day as tolerated.  I will also have him repeat a chest xray.  Depending on his response will try to further decrease his prednisone.  If we are not able to do this, he may need to be tried on a steroid sparing agent such as methotrexate.  Medications Added to Medication List This Visit: 1)  Prednisone 2.5 Mg Tabs (Prednisone) .... One by mouth every other day  Complete Medication List: 1)  Prednisone 2.5 Mg Tabs (Prednisone) .... One by mouth every other day 2)  Adderall Xr 20 Mg Cp24 (Amphetamine-dextroamphetamine) .... 2 by mouth once daily  Other Orders: T-2 View CXR (71020TC) Est.  Patient Level III (16109)  Patient Instructions: 1)  Chest xray today 2)  Change prednisone to 2.5 mg every other day  3)  follow up in 2 to 3 months Prescriptions: PREDNISONE 2.5 MG TABS (PREDNISONE) one by mouth every other day  #30 x 3   Entered and Authorized by:   Coralyn Helling MD   Signed by:   Coralyn Helling MD on 01/04/2010   Method used:   Electronically to        [facility]. [phone]* (retail)       [phone] [address]       [place], [place]  [address]       Ph: [phone]       Fax: [phone]   RxID:   [id number]

## 2011-01-18 NOTE — Assessment & Plan Note (Signed)
Summary: rov 4  wks ///kp   Visit Type:  Follow-up Primary Provider/Referring Provider:  [name]  CC:  The patient is here for follow-up and c/o sores in his mouth. He thinks this is from the methotrexate.[name]  History of Present Illness: 50 yo male with cough in setting of sarcoidosis on prednisone and methotrexate, and mild OSA.  He is now down to 2.5 mg of prednisone every other day.  His cough has not gotten anyworse.  He is not having any breathing problems or fever.  He has been getting sores in his mouth.  He has not decided what he would want to do about his sleep apnea.  He has been trying to sleep on his side more.   Current Medications (verified): 1)  Adderall Xr 20 Mg  Cp24 (Amphetamine-Dextroamphetamine) .... 2 By Mouth Once Daily 2)  Methotrexate 2.5 Mg Tabs (Methotrexate Sodium) .... Three Pills Once Per Week 3)  Folic Acid 1 Mg Tabs (Folic Acid) .... One By Mouth Once Daily 4)  Prednisone 2.5 Mg Tabs (Prednisone) .[name].. 1 By Mouth Every Other Day  Allergies (verified): No Known Drug Allergies  Past History:  Past Medical History: Reviewed history from 07/07/2010 and no changes required. Migraine headache Sarcoidosis bronchoscopy 2006 on chronic prednisone      - MTX started 04/19/10 Jehovah's Witness Nephrolithiasis, hx of  episode Jan 2010.  Calcium stones ADD formal evaluaton Mild OSA      - Apnealink 07/01/10 RDI 7, O2 nadir 83%  [employer]  [name]  Past Surgical History: Reviewed history from 11/23/2007 and no changes required. knee surgery  1985, 1986 fx femur  1984 residual nerve damage left leg kidney stones Hernia repair age 61   Vital Signs:  Patient profile:   50 year old male Height:      67.5 inches (171.45 cm) Weight:      148 pounds (67.27 kg) BMI:     22.92 O2 Sat:      98 % on Room air Temp:     98.1 degrees F (36.72 degrees C) oral Pulse rate:   105 / minute BP sitting:   110 / 72  (left arm) Cuff size:    regular  Vitals Entered By: Michel Bickers CMA (July 13, 2010 3:14 PM)  O2 Sat at Rest %:  98 O2 Flow:  Room air  Physical Exam  General:  normal appearance and healthy appearing.   Nose:  no deformity, discharge, inflammation, or lesions Mouth:  apthous ulcer  Neck:  no JVD.   Lungs:  clear bilaterally to auscultation and percussion Heart:  regular rate and rhythm, S1, S2 without murmurs, rubs, gallops, or clicks Extremities:  no edema or cyanosis Cervical Nodes:  no significant adenopathy   Impression & Recommendations:  Problem # 1:  SARCOIDOSIS (ICD-135) Will stop prednisone.  Will keep methotrexate at same dose.  Will check his labs.  Problem # 2:  OBSTRUCTIVE SLEEP APNEA (ICD-327.23) He is to continue positional therapy.  He has not decided about whether he would want to have additional interventions.  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  Will increase his folic acid.  Medications Added to Medication List This Visit: 1)  Folic Acid 5 Mg Caps (Folic acid) .... One by mouth once daily 2)  Prednisone 2.5 Mg Tabs (Prednisone) .[name].. 1 by mouth every other day  Complete Medication List: 1)  Adderall Xr 20 Mg Cp24 (Amphetamine-dextroamphetamine) .Marland KitchenMarland KitchenMarland Kitchen  2 by mouth once daily 2)  Methotrexate 2.5 Mg Tabs (Methotrexate sodium) .... Three pills once per week 3)  Folic Acid 1 Mg Tabs (Folic acid) .... Take 4 tablets by mouth once daily  Other Orders: Est. Patient Level III (16109) TLB-BMP (Basic Metabolic Panel-BMET) (80048-METABOL) TLB-CBC Platelet - w/Differential (85025-CBCD) TLB-Hepatic/Liver Function Pnl (80076-HEPATIC)  Patient Instructions: 1)  Stop prednisone 2)  Change folic acid to 5 mg once daily  3)  Lab work today 4)  Follow up in 4 to 6 weeks Prescriptions: FOLIC ACID 5 MG CAPS (FOLIC ACID) one by mouth once daily  #30 x 3   Entered and Authorized by:   Coralyn Helling MD   Signed by:   Coralyn Helling MD on 07/13/2010   Method  used:   Electronically to        [facility]. [phone]* (retail)       [phone] [address].       [place], [place]  [address]       Ph: [phone]       Fax: [phone]   RxID:   [id number]

## 2011-01-18 NOTE — Progress Notes (Signed)
Summary: REFILL REQUEST  Phone Note Refill Request Message from:  Patient on September 22, 2010 8:52 AM  Refills Requested: Medication #1:  ADDERALL XR 20 MG  CP24 1-2 by mouth once daily   Notes: Pt can be reached at [phone] when Rx is ready for p/u.    Initial call taken by: Debbra Riding,  September 22, 2010 8:53 AM  Follow-up for Phone Call        Pt needs to schedule a follow up appt before next refill. Follow-up by: Romualdo Bolk, CMA Duncan Dull),  September 22, 2010 10:32 AM  Additional Follow-up for Phone Call Additional follow up Details #1::        Pt aware that rx is ready to pick up and that he needs a rov. Additional Follow-up by: Romualdo Bolk, CMA (AAMA),  September 22, 2010 11:32 AM    New/Updated Medications: ADDERALL XR 20 MG  CP24 (AMPHETAMINE-DEXTROAMPHETAMINE) 2 by mouth once daily Prescriptions: ADDERALL XR 20 MG  CP24 (AMPHETAMINE-DEXTROAMPHETAMINE) 2 by mouth once daily  #60 x 0   Entered by:   Romualdo Bolk, CMA (AAMA)   Authorized by:   [name]   Signed by:   Romualdo Bolk, CMA (AAMA) on 09/22/2010   Method used:   Print then Give to Patient   RxID:   [id number]

## 2011-01-18 NOTE — Assessment & Plan Note (Signed)
Summary: 6 weeks/ mbw   Visit Type:  Follow-up Primary Provider/Referring Provider:  [name]  CC:  Sarcoid follow-up...no complaints today.  History of Present Illness: 50 yo male with cough in setting of sarcoidosis on prednisone and methotrexate, and mild OSA.  He has been stable since stopping prednisone.  His mouth sores have gotten better.  He still has occasional dry cough.    Current Medications (verified): 1)  Adderall Xr 20 Mg  Cp24 (Amphetamine-Dextroamphetamine) .[name].. 1-2 By Mouth Once Daily 2)  Methotrexate 2.5 Mg Tabs (Methotrexate Sodium) .... Three Pills Once Per Week 3)  Folic Acid 400 Mcg Tabs (Folic Acid) .[name].. 1 By Mouth Daily  Allergies (verified): No Known Drug Allergies  Past History:  Past Medical History: Migraine headache Sarcoidosis bronchoscopy 2006      - MTX started 04/19/10      - off prednisone July 2011 Jehovah's Witness Nephrolithiasis, hx of  episode Jan 2010.  Calcium stones ADD formal evaluaton Mild OSA      - Apnealink 07/01/10 RDI 7, O2 nadir 83%  CONSULTANTS  [name]  Past Surgical History: Reviewed history from 11/23/2007 and no changes required. knee surgery  1985, 1986 fx femur  1984 residual nerve damage left leg kidney stones Hernia repair age 73   Review of Systems       The patient complains of non-productive cough and headaches.  The patient denies shortness of breath with activity, shortness of breath at rest, productive cough, coughing up blood, chest pain, irregular heartbeats, acid heartburn, indigestion, weight change, abdominal pain, difficulty swallowing, sore throat, nasal congestion/difficulty breathing through nose, hand/feet swelling, joint stiffness or pain, rash, and fever.    Vital Signs:  Patient profile:   50 year old male Height:      67.5 inches (171.45 cm) Weight:      152 pounds (69.09 kg) BMI:     23.54 O2 Sat:      98 % on Room air Temp:     98.6 degrees F (37.00 degrees C)  oral Pulse rate:   97 / minute BP sitting:   130 / 82  (right arm) Cuff size:   regular  Vitals Entered By: Michel Bickers CMA (August 24, 2010 2:07 PM)  O2 Sat at Rest %:  98 O2 Flow:  Room air CC: Sarcoid follow-up...no complaints today Comments Medications reviewed with the patient. Daytime phone verified. Michel Bickers [facility]  August 24, 2010 2:08 PM   Physical Exam  General:  normal appearance and healthy appearing.   Nose:  no deformity, discharge, inflammation, or lesions Mouth:  no deformity or lesions Neck:  no JVD.   Lungs:  clear bilaterally to auscultation and percussion Heart:  regular rate and rhythm, S1, S2 without murmurs, rubs, gallops, or clicks Extremities:  no edema or cyanosis Neurologic:  normal CN II-XII and strength normal.   Skin:  intact without lesions or rashes Cervical Nodes:  no significant adenopathy   Impression & Recommendations:  Problem # 1:  SARCOIDOSIS (ICD-135) He is stable on his current dose of MTX and off prednisone.  Will continue MTX.  Will plan on repeating lab tests at next [name] in 2 months.  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  Will continue his current dose for now.  Problem # 3:  OBSTRUCTIVE SLEEP APNEA (ICD-327.23)  He is to continue positional therapy.  Medications Added to Medication List  This Visit: 1)  Adderall Xr 20 Mg Cp24 (Amphetamine-dextroamphetamine) .[name].. 1-2 by mouth once daily 2)  Folic Acid 400 Mcg Tabs (Folic acid) .[name].. 1 by mouth daily  Complete Medication List: 1)  Adderall Xr 20 Mg Cp24 (Amphetamine-dextroamphetamine) .[name].. 1-2 by mouth once daily 2)  Methotrexate 2.5 Mg Tabs (Methotrexate sodium) .... Three pills once per week 3)  Folic Acid 400 Mcg Tabs (Folic acid) .[name].. 1 by mouth daily  Other Orders: Est. Patient Level III (04540)  Patient Instructions: 1)  Follow up in 2 months

## 2011-01-18 NOTE — Progress Notes (Signed)
Summary: Pt req script for Adderall XR 20mg  #60  Phone Note Call from Patient Call back at [phone] cell   Caller: Patient Summary of Call: Pt req script for Adderall XR 20mg   #60.  Pls call pt when ready for pick up.  Initial call taken by: Lucy Antigua,  June 01, 2010 4:12 PM  Follow-up for Phone Call        done Follow-up by: Nelwyn Salisbury MD,  June 03, 2010 7:54 AM  Additional Follow-up for Phone Call Additional follow up Details #1::        Rx is ready to pick up. Left message on machine about this. Additional Follow-up by: Romualdo Bolk, CMA (AAMA),  June 03, 2010 8:15 AM    Prescriptions: ADDERALL XR 20 MG  CP24 (AMPHETAMINE-DEXTROAMPHETAMINE) 2 by mouth once daily  #60 x 0   Entered and Authorized by:   Nelwyn Salisbury MD   Signed by:   Nelwyn Salisbury MD on 06/03/2010   Method used:   Print then Give to Patient   RxID:   [id number]

## 2011-01-18 NOTE — Assessment & Plan Note (Signed)
Summary: acute sick visit for cough, sarcoidosis   Primary Provider/Referring Provider:  [name]  CC:  Pt is here for a sick visit.  Pt is Barry Silva pt.  Pt states Barry Silva reduced his prednisone recently down to 2.5mg  every other day.  Pt now c/o increased cough esp at night making it difficult to sleep. Pt denied any increased sob.  [name]  History of Present Illness: The pt is a 50y/o male who comes in today for an acute sick visit.  He is usually followed by Barry Silva for sarcoidosis, and is on chronic prednisone.  He tells me that cough is the primary manifestation of his disease, and that he often sees a worsening of his symptoms below 10mg  of prednisone a day.  He is unsure if he has ever been tried on ICS.  He notes a 3 week h/o progressive cough, and comes in because he can't stand it anymore.  It is dry and hacking in nature.  He denies increased sob.  Medications Prior to Update: 1)  Prednisone 2.5 Mg Tabs (Prednisone) .... One By Mouth Every Other Day 2)  Adderall Xr 20 Mg  Cp24 (Amphetamine-Dextroamphetamine) .... 2 By Mouth Once Daily 3)  Tamiflu 75 Mg Caps (Oseltamivir Phosphate) .... One By Mouth Two Times A Day For 5 Days  Current Medications (verified): 1)  Prednisone 2.5 Mg Tabs (Prednisone) .... One By Mouth Every Other Day 2)  Adderall Xr 20 Mg  Cp24 (Amphetamine-Dextroamphetamine) .... 2 By Mouth Once Daily  Allergies (verified): No Known Drug Allergies  Past History:  Past Medical History: Reviewed history from 02/26/2009 and no changes required. migraine headache sarcoidosis bronchoscopy 2006 Jehovah's Witness Nephrolithiasis, hx of  episode Jan 2010.  Calcium stones ADD formal evaluaton [employer]  Barry Silva  Past Surgical History: Reviewed history from 11/23/2007 and no changes required. knee surgery  1985, 1986 fx femur  1984 residual nerve damage left leg kidney stones Hernia repair age 56   Family History: Reviewed history from  11/23/2007 and no changes required. negative for sudden cardiac death or premature heart disease  Social History: Reviewed history from 11/10/2009 and no changes required. Married Never Smoked Je[employer]ah's Witness hh of   works for roofing business     Review of Systems       The patient complains of non-productive cough.  The patient denies shortness of breath with activity, shortness of breath at rest, productive cough, coughing up blood, chest pain, irregular heartbeats, acid heartburn, indigestion, loss of appetite, weight change, abdominal pain, difficulty swallowing, sore throat, tooth/dental problems, headaches, nasal congestion/difficulty breathing through nose, sneezing, itching, ear ache, anxiety, depression, hand/feet swelling, joint stiffness or pain, rash, change in color of mucus, and fever.    Vital Signs:  Patient profile:   50 year old male Height:      67.5 inches Weight:      153.25 pounds BMI:     23.73 O2 Sat:      97 % on Room air Temp:     98.2 degrees F oral Pulse rate:   77 / minute BP sitting:   124 / 80  (left arm) Cuff size:   regular  Vitals Entered By: Arman Filter LPN (February 25, 5283 3:34 PM)  O2 Flow:  Room air CC: Pt is here for a sick visit.  Pt is Barry Silva pt.  Pt states Barry Silva reduced his prednisone recently down to 2.5mg  every other day.  Pt now c/o increased cough esp at night making it difficult to sleep. Pt denied any increased sob.   Comments Medications reviewed with patient  Arman Filter LPN  February 24, 1609 3:34 PM    Physical Exam  General:  wd male in nad Lungs:  totally clear to auscultation Heart:  rrr, no mrg Extremities:  no edema or cyanosis Neurologic:  alert and oriented, moves all 4.   Impression & Recommendations:  Problem # 1:  COUGH, CHRONIC (ICD-786.2)  the pt states this is his primary manifestation of his sarcoid.  He has had worsening symptoms with decreasing prednisone dosing, and it is unclear if he  has been tried on ICS in the past.  The pt is miserable, and wants to improve cough as quickly as possible.  Will increase prednisone up to 15mg /day, and taper from there.  He is not willing to try ICS first, or go to a higher taper for quick resolution of symptoms.  He will follow up with Barry Silva the end of the month.  Medications Added to Medication List This Visit: 1)  Prednisone 5 Mg Tabs (Prednisone) .... As directed  Other Orders: Est. Patient Level IV (96045)  Patient Instructions: 1)  increase prednisone to 15mg  a day for 7 days, then 10mg  a day for  7days, then 7.5 mg a day until you see Barry Silva.   Prescriptions: PREDNISONE 5 MG TABS (PREDNISONE) as directed  #50 x 0   Entered and Authorized by:   Barbaraann Share MD   Signed by:   Barbaraann Share MD on 02/24/2010   Method used:   Print then Give to Patient   RxID:   [id number]

## 2011-01-18 NOTE — Progress Notes (Signed)
Summary: refill  Phone Note Refill Request Call back at [phone] Message from:  Patient---live call  Refills Requested: Medication #1:  ADDERALL XR 20 MG  CP24 2 by mouth once daily call pt when ready.  Initial call taken by: Warnell Forester,  October 27, 2010 1:20 PM  Follow-up for Phone Call        Spoke with pt and he is going to call his pharmacy to make sure they have this rx before we write it. Pt to call us to let us know. Follow-up by: Romualdo Bolk, CMA Duncan Dull),  October 27, 2010 1:36 PM  Additional Follow-up for Phone Call Additional follow up Details #1::        Pt [facility] saying that [facility]  Additional Follow-up by: Romualdo Bolk, CMA Duncan Dull),  October 27, 2010 2:45 PM    Additional Follow-up for Phone Call Additional follow up Details #2::    Pt came in today for an office visit. Gave pt rx then. Follow-up by: Romualdo Bolk, CMA Duncan Dull),  October 28, 2010 8:31 AM  Prescriptions: ADDERALL XR 20 MG  CP24 (AMPHETAMINE-DEXTROAMPHETAMINE) 2 by mouth once daily  #60 x 0   Entered by:   Romualdo Bolk, CMA (AAMA)   Authorized by:   [name]   Signed by:   Romualdo Bolk, CMA (AAMA) on 10/27/2010   Method used:   Print then Give to Patient   RxID:   [id number]

## 2011-01-18 NOTE — Assessment & Plan Note (Signed)
Summary: fu on meds/njr/Pt reschedule/ssc   Vital Signs:  Patient profile:   50 year old male Height:      67.5 inches Weight:      156 pounds Pulse rate:   80 / minute BP sitting:   130 / 92  (right arm) Cuff size:   regular  Vitals Entered By: Barry Silva, CMA (AAMA) (October 28, 2010 10:58 AM)  Serial Vital Signs/Assessments:  Time      Position  BP       Pulse  Resp  Temp     By                     140/90                         Barry Headings MD  Comments: right arms sitting By: Barry Headings MD   CC: Follow-up visit on meds   History of Present Illness: Barry Silva comes in today  for follow up of meds.  Since last visit  here  there have been no major changes in health status  .  his mother in law who lived with them recently passed away. ADD:  no change in adderall responsiveness  taking 1-2 per day.  NO se   He has decreased caffiene  to avoid palpitations . sleep is ok.  BP readings: usually normal at home. Although the diastolic runs in the 90s. The chest pain or limitations. He does have a recurrent cough that he thinks may be related to drinking milk because when he stopped his cough went away, however he's off prednisone now not methotrexate forces sarcoid disease.   Preventive Screening-Counseling & Management  Alcohol-Tobacco     Alcohol drinks/day: 1     Alcohol type: wine     Smoking Status: never  Caffeine-Diet-Exercise     Caffeine use/day: 0     Does Patient Exercise: yes     Type of exercise: work related     Exercise (avg: min/session): 30-60     Times/week: 6  Current Medications (verified): 1)  Adderall Xr 20 Mg  Cp24 (Amphetamine-Dextroamphetamine) .... 2 By Mouth Once Daily 2)  Methotrexate 2.5 Mg Tabs (Methotrexate Sodium) .... Three Pills Once Per Week 3)  Folic Acid 400 Mcg Tabs (Folic Acid) .[name].. 1 By Mouth Daily  Allergies (verified): No Known Drug Allergies  Past History:  Past medical, surgical, family and  social histories (including risk factors) reviewed, and no changes noted (except as noted below).  Past Medical History: Reviewed history from 08/24/2010 and no changes required. Migraine headache Sarcoidosis bronchoscopy 2006      - MTX started 04/19/10      - off prednisone July 2011 Jehovah's Witness Nephrolithiasis, hx of  episode Jan 2010.  Calcium stones ADD formal evaluaton Mild OSA      - Apnealink 07/01/10 RDI 7, O2 nadir 83%  [employer]ULT[employer]  Barry Silva  Past Surgical History: Reviewed history from 11/23/2007 and no changes required. knee surgery  1985, 1986 fx femur  1984 residual nerve damage left leg kidney stones Hernia repair age 21   Past History:  Care Management: Pulmonary: Barry Silva Urology: Barry Silva   Family History: Reviewed history from 11/23/2007 and no changes required. negative for sudden cardiac death or premature heart disease  Social History: Reviewed history from 11/10/2009 and no changes required. Married Never Smoked [employer] Witness hh of   2  mother-in-law just passed away works  roofing business     Review of Systems  The patient denies anorexia, fever, weight loss, weight gain, vision loss, decreased hearing, chest pain, syncope, peripheral edema, melena, hematochezia, severe indigestion/heartburn, transient blindness, difficulty walking, abnormal bleeding, enlarged lymph nodes, and angioedema.    Physical Exam  General:  Well-developed,well-nourished,in no acute distress; alert,appropriate and cooperative throughout examination Head:  normocephalic and atraumatic.   Eyes:  vision grossly intact, pupils equal, and pupils round.   Neck:  No deformities, masses, or tenderness noted. Lungs:  Normal respiratory effort, chest expands symmetrically. Lungs are clear to auscultation, no crackles or wheezes. Heart:  Normal rate and regular rhythm. S1 and S2 normal without gallop, murmur, click, rub or other extra sounds.no lifts.     Abdomen:  Bowel sounds positive,abdomen soft and non-tender without masses, organomegaly or  noted. Pulses:  pulses intact without delay   Neurologic:  nonfocal Cervical Nodes:  No lymphadenopathy noted Psych:  Oriented X3, memory intact for recent and remote, good eye contact, not anxious appearing, and not depressed appearing.     Impression & Recommendations:  Problem # 1:  ADD (ICD-314.00) stable on the current medication without significant side effect.    Problem # 2:  ELEVATED BLOOD PRESSURE WITHOUT DIAGNOSIS OF HYPERTENSION (ICD-796.2) his blood pressure has been normal. At the last two pulmonary visits. His readings at home are 130/90  range.  Discussed getting multiple readings and follow-up.  Problem # 3:  SARCOIDOSIS (ICD-135) stable on methotrexate. Discussed folic acid. Labs will be monitored by Barry Silva   .  Problem # 4:  cough  probably not for milk allergy but can do an elimination diet and add back. More likely positive affect of his sarcoid treatment.  Complete Medication List: 1)  Adderall Xr 20 Mg Cp24 (Amphetamine-dextroamphetamine) .... 2 by mouth once daily 2)  Methotrexate 2.5 Mg Tabs (Methotrexate sodium) .... Three pills once per week 3)  Folic Acid 400 Mcg Tabs (Folic acid) .[name].. 1 by mouth daily   Patient Instructions: 1)  continue to monitor your Blood pressure . 2)  Continue medication. 3)  return office visit in 6 months . 4)  Call if BP readings are  elevated. Prescriptions: ADDERALL XR 20 MG  CP24 (AMPHETAMINE-DEXTROAMPHETAMINE) 2 by mouth once daily  #60 x 0   Entered and Authorized by:   Barry Headings MD   Signed by:   Barry Headings MD on 10/28/2010   Method used:   Print then Give to Patient   RxID:   [id number]  above rx was a duplicate in error and was shredded   and not given to patient .Barry Headings MD  October 28, 2010 12:21 PM   Orders Added: 1)  Est. Patient Level III [99213]   Contraindications/Deferment of  Procedures/Staging:    Test/Procedure: FLU VAX    Reason for deferment: patient declined

## 2011-01-18 NOTE — Progress Notes (Signed)
Summary: appt  ---- Converted from flag ---- ---- 05/19/2010 9:38 AM, Michel Bickers CMA wrote: Marylene Land, can you help by calling this patient and getting him sch for 4 week f/u with VS? Thank you!  Lori  ---- 05/18/2010 5:37 PM, Coralyn Helling MD wrote: Can you call to schedule Mr. Demetriou for [name] in 4 weeks.  There was no one in office when he was leaving to schedule appt.  Thanks. ------------------------------  Phone Note Outgoing Call   Caller: Patient Call placed by: Eugene Gavia,  May 20, 2010 1:09 PM Call placed to: Patient Summary of Call: called & left patient a message to call back to schedule a 4 week appointment. Initial call taken by: Eugene Gavia,  May 20, 2010 1:11 PM  Follow-up for Phone Call        pt has been schedulled for 06/15/2010 @ 1:45. Follow-up by: Eugene Gavia,  May 26, 2010 8:16 AM

## 2011-01-18 NOTE — Progress Notes (Signed)
Summary: Pt req Adderall XR 20mg      Phone Note Refill Request Call back at [phone]   Refills Requested: Medication #1:  ADDERALL XR 20 MG  CP24 2 by mouth once daily   Dosage confirmed as above?Dosage Confirmed   Supply Requested: 1 month  Method Requested: Pick up at Office Initial call taken by: Lucy Antigua,  July 20, 2010 8:33 AM  Follow-up for Phone Call        Left message on machine that rx is ready to pick up. Follow-up by: Romualdo Bolk, CMA (AAMA),  July 20, 2010 1:06 PM    Prescriptions: ADDERALL XR 20 MG  CP24 (AMPHETAMINE-DEXTROAMPHETAMINE) 2 by mouth once daily  #60 x 0   Entered by:   Romualdo Bolk, CMA (AAMA)   Authorized by:   [name]   Signed by:   Romualdo Bolk, CMA (AAMA) on 07/20/2010   Method used:   Print then Give to Patient   RxID:   [id number]

## 2011-01-20 NOTE — Progress Notes (Signed)
Summary: Pt req refill of Adderall XR 20mg   Phone Note Refill Request Call back at [phone] cell Message from:  Patient on December 14, 2010 11:14 AM  Refills Requested: Medication #1:  ADDERALL XR 20 MG  CP24 2 by mouth once daily   Dosage confirmed as above?Dosage Confirmed   Supply Requested: 1 month  Method Requested: Pick up at Office Initial call taken by: Lucy Antigua,  December 14, 2010 11:14 AM  Follow-up for Phone Call        Pt aware that rx is ready to pick up and that he needs to schedule a post hospital follow up. Follow-up by: Romualdo Bolk, CMA (AAMA),  December 14, 2010 1:25 PM    Prescriptions: ADDERALL XR 20 MG  CP24 (AMPHETAMINE-DEXTROAMPHETAMINE) 2 by mouth once daily  #60 x 0   Entered by:   Romualdo Bolk, CMA (AAMA)   Authorized by:   [name]   Signed by:   Romualdo Bolk, CMA (AAMA) on 12/14/2010   Method used:   Print then Give to Patient   RxID:   [id number]

## 2011-01-20 NOTE — Progress Notes (Signed)
Summary: FYI  Phone Note Call from Patient Call back at [phone]   Caller: Patient Call For: sood Summary of Call: Pt wants VS to know he went to [facility] er yesterday c/o chest pains and was admitted to rm 1426, wants him to come see him. Initial call taken by: Darletta Moll,  November 29, 2010 8:33 AM  Follow-up for Phone Call        FYI. Carron Curie CMA  November 29, 2010 9:47 AM

## 2011-01-20 NOTE — Consult Note (Signed)
Summary: [place]  [place]   Imported By: Sherian Rein 12/02/2010 11:57:07  _____________________________________________________________________  External Attachment:    Type:   Image     Comment:   External Document

## 2011-01-20 NOTE — Assessment & Plan Note (Signed)
Summary: Pulmonary inpt consult > try off mtx and on max gerd rx   Primary Provider/Referring Provider:  [name]   History of Present Illness: 68 yowm never smoker with cough in setting of sarcoidosis on prednisone and methotrexate, and mild OSA.   November 29, 2010 seen as inpt consult at [facility] for CP and chronic cough that only fully resolved p changed diet to remove dairy products, vastly overusing menthol containing products therefore imp GERD with atypical cp, chronic cough not all related to sarcoid     Medications Prior to Update: 1)  Adderall Xr 20 Mg  Cp24 (Amphetamine-Dextroamphetamine) .... 2 By Mouth Once Daily 2)  Methotrexate 2.5 Mg Tabs (Methotrexate Sodium) .... Three Pills Once Per Week 3)  Folic Acid 400 Mcg Tabs (Folic Acid) .[name].. 1 By Mouth Daily  Allergies: No Known Drug Allergies  Past History:  Past Medical History: Migraine headache Sarcoidosis bronchoscopy 2006      - MTX started 04/19/10 > try off November 29, 2010 (atypical cp/ ?worsening ct chest [facility])      - off prednisone July 2011 Jehovah's Witness Nephrolithiasis, hx of  episode Jan 2010.  Calcium stones ADD formal evaluaton Mild OSA      - Apnealink 07/01/10 RDI 7, O2 nadir 83%  [employer]ULT[employer]S  [facility]   [place]  Family History: Reviewed history from 11/23/2007 and no changes required. negative for sudden cardiac death or premature heart disease  Social History: Reviewed history from 10/28/2010 and no changes required. 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

## 2011-01-20 NOTE — Progress Notes (Signed)
  Phone Note From Pharmacy   Caller: [facility] [address]  Summary of Call: fax [phone] phone [phone] Received fax from pharmacy for Methotrexate 2.5mg  per MW last OV as follows.  Patient Instructions: 1)  max rx for gerd 2)  try off mtx until next ov    Signed by Nyoka Cowden MD on 11/29/2010 at 8:57 PM Signed by Coralyn Helling MD on 11/29/2010 at 10:18 PM  Returned request to pharmacy stating "Need to call office, medication has been d/c'd. Thx!". Zackery Barefoot CMA  December 01, 2010 4:49 PM

## 2011-02-10 ENCOUNTER — Telehealth: Payer: Self-pay | Admitting: Internal Medicine

## 2011-02-10 NOTE — Telephone Encounter (Signed)
Pt req script for Adderall XR 20 mg. Pls call pt when ready for pick up.

## 2011-02-11 MED ORDER — AMPHETAMINE-DEXTROAMPHET ER 20 MG PO CP24: ORAL_CAPSULE | ORAL | Status: DC

## 2011-02-11 NOTE — Telephone Encounter (Signed)
Pt aware of this. 

## 2011-03-01 LAB — LIPID PANEL
Cholesterol: 157 mg/dL (ref 0–200)
LDL Cholesterol: 93 mg/dL (ref 0–99)
Triglycerides: 82 mg/dL (ref <150)
VLDL: 16 mg/dL (ref 0–40)

## 2011-03-01 LAB — CK TOTAL AND CKMB (NOT AT ARMC)
CK, MB: 1.2 ng/mL (ref 0.3–4.0)
Relative Index: INVALID (ref 0.0–2.5)
Total CK: 68 U/L (ref 7–232)
Total CK: 87 U/L (ref 7–232)

## 2011-03-01 LAB — DIFFERENTIAL
Basophils Absolute: 0 10*3/uL (ref 0.0–0.1)
Basophils Relative: 0 % (ref 0–1)
Eosinophils Absolute: 0.1 10*3/uL (ref 0.0–0.7)
Eosinophils Relative: 0 % (ref 0–5)
Lymphocytes Relative: 13 % (ref 12–46)
Monocytes Absolute: 1.6 10*3/uL — ABNORMAL HIGH (ref 0.1–1.0)
Monocytes Relative: 19 % — ABNORMAL HIGH (ref 3–12)
Neutro Abs: 11.3 10*3/uL — ABNORMAL HIGH (ref 1.7–7.7)
Neutro Abs: 5.5 10*3/uL (ref 1.7–7.7)
Neutrophils Relative %: 66 % (ref 43–77)
Neutrophils Relative %: 80 % — ABNORMAL HIGH (ref 43–77)

## 2011-03-01 LAB — CBC
HCT: 37.8 % — ABNORMAL LOW (ref 39.0–52.0)
Hemoglobin: 13.5 g/dL (ref 13.0–17.0)
MCH: 31.5 pg (ref 26.0–34.0)
MCH: 31.8 pg (ref 26.0–34.0)
MCHC: 35.7 g/dL (ref 30.0–36.0)
RBC: 4.25 MIL/uL (ref 4.22–5.81)
RDW: 12.6 % (ref 11.5–15.5)

## 2011-03-01 LAB — BASIC METABOLIC PANEL
CO2: 29 mEq/L (ref 19–32)
Chloride: 105 mEq/L (ref 96–112)
Glucose, Bld: 93 mg/dL (ref 70–99)
Potassium: 3.9 mEq/L (ref 3.5–5.1)
Sodium: 138 mEq/L (ref 135–145)

## 2011-03-01 LAB — COMPREHENSIVE METABOLIC PANEL
ALT: 37 U/L (ref 0–53)
CO2: 25 mEq/L (ref 19–32)
Creatinine, Ser: 0.98 mg/dL (ref 0.4–1.5)
GFR calc Af Amer: >60 mL/min (ref >60)
GFR calc non Af Amer: >60 mL/min (ref >60)
Glucose, Bld: 92 mg/dL (ref 70–99)
Total Bilirubin: 2.8 mg/dL — ABNORMAL HIGH (ref 0.3–1.2)

## 2011-03-01 LAB — POCT CARDIAC MARKERS
CKMB, poc: <1 ng/mL — ABNORMAL LOW (ref 1.0–8.0)
Myoglobin, poc: 75.9 ng/mL (ref 12–200)

## 2011-03-01 LAB — TROPONIN I
Troponin I: 0.01 ng/mL (ref 0.00–0.06)
Troponin I: 0.01 ng/mL (ref 0.00–0.06)

## 2011-03-07 LAB — DIFFERENTIAL
Basophils Absolute: 0 10*3/uL (ref 0.0–0.1)
Lymphocytes Relative: 17 % (ref 12–46)
Lymphocytes Relative: 8 % — ABNORMAL LOW (ref 12–46)
Lymphs Abs: 0.8 10*3/uL (ref 0.7–4.0)
Monocytes Absolute: 0.6 10*3/uL (ref 0.1–1.0)
Monocytes Relative: 8 % (ref 3–12)
Neutro Abs: 5.7 10*3/uL (ref 1.7–7.7)
Neutro Abs: 8.6 10*3/uL — ABNORMAL HIGH (ref 1.7–7.7)
Neutrophils Relative %: 83 % — ABNORMAL HIGH (ref 43–77)

## 2011-03-07 LAB — CBC
HCT: 43.6 % (ref 39.0–52.0)
MCV: 91.8 fL (ref 78.0–100.0)
MCV: 92.1 fL (ref 78.0–100.0)
Platelets: 277 10*3/uL (ref 150–400)
RBC: 4.74 MIL/uL (ref 4.22–5.81)
RDW: 13.3 % (ref 11.5–15.5)
WBC: 10.4 10*3/uL (ref 4.0–10.5)

## 2011-03-07 LAB — COMPREHENSIVE METABOLIC PANEL
Albumin: 4.1 g/dL (ref 3.5–5.2)
BUN: 13 mg/dL (ref 6–23)
Creatinine, Ser: 0.96 mg/dL (ref 0.4–1.5)
Total Bilirubin: 2.2 mg/dL — ABNORMAL HIGH (ref 0.3–1.2)
Total Protein: 6.9 g/dL (ref 6.0–8.3)

## 2011-03-07 LAB — URINE MICROSCOPIC-ADD ON

## 2011-03-07 LAB — BASIC METABOLIC PANEL
Calcium: 9.2 mg/dL (ref 8.4–10.5)
Chloride: 105 mEq/L (ref 96–112)
Creatinine, Ser: 0.95 mg/dL (ref 0.4–1.5)
GFR calc Af Amer: >60 mL/min (ref >60)
GFR calc non Af Amer: >60 mL/min (ref >60)

## 2011-03-07 LAB — URINE CULTURE

## 2011-03-07 LAB — URINALYSIS, ROUTINE W REFLEX MICROSCOPIC
Glucose, UA: NEGATIVE mg/dL
Nitrite: NEGATIVE
Protein, ur: 30 mg/dL — AB
Specific Gravity, Urine: 1.021 (ref 1.005–1.030)
Specific Gravity, Urine: 1.022 (ref 1.005–1.030)
Urobilinogen, UA: 0.2 mg/dL (ref 0.0–1.0)
pH: 6 (ref 5.0–8.0)

## 2011-03-07 LAB — POCT HEMOGLOBIN-HEMACUE: Hemoglobin: 13.4 g/dL (ref 13.0–17.0)

## 2011-03-07 LAB — APTT: aPTT: 28 seconds (ref 24–37)

## 2011-03-17 ENCOUNTER — Telehealth: Payer: Self-pay | Admitting: Internal Medicine

## 2011-03-17 NOTE — Telephone Encounter (Signed)
Pt req script for Adderall XR 20 mg. Pls call when ready for pick up.

## 2011-03-18 MED ORDER — AMPHETAMINE-DEXTROAMPHET ER 20 MG PO CP24: ORAL_CAPSULE | ORAL | Status: DC

## 2011-03-18 NOTE — Telephone Encounter (Signed)
Pt is due for a rov in May.

## 2011-03-18 NOTE — Telephone Encounter (Signed)
Pt aware that rx is ready to pick up 

## 2011-04-04 LAB — CBC
HCT: 42.2 % (ref 39.0–52.0)
Hemoglobin: 14.3 g/dL (ref 13.0–17.0)
RBC: 4.67 MIL/uL (ref 4.22–5.81)
RDW: 12.3 % (ref 11.5–15.5)
WBC: 6.8 10*3/uL (ref 4.0–10.5)

## 2011-04-04 LAB — URINALYSIS, ROUTINE W REFLEX MICROSCOPIC
Bilirubin Urine: NEGATIVE
Bilirubin Urine: NEGATIVE
Glucose, UA: NEGATIVE mg/dL
Ketones, ur: NEGATIVE mg/dL
Nitrite: NEGATIVE
Nitrite: NEGATIVE
Specific Gravity, Urine: 1.021 (ref 1.005–1.030)
Specific Gravity, Urine: 1.005 (ref 1.005–1.030)
pH: 6 (ref 5.0–8.0)
pH: 6.5 (ref 5.0–8.0)

## 2011-04-04 LAB — BASIC METABOLIC PANEL
BUN: 12 mg/dL (ref 6–23)
CO2: 27 mEq/L (ref 19–32)
Calcium: 9.1 mg/dL (ref 8.4–10.5)
GFR calc Af Amer: >60 mL/min (ref >60)
GFR calc non Af Amer: >60 mL/min (ref >60)
GFR calc non Af Amer: >60 mL/min (ref >60)
Glucose, Bld: 98 mg/dL (ref 70–99)
Glucose, Bld: 100 mg/dL — ABNORMAL HIGH (ref 70–99)
Potassium: 3.6 mEq/L (ref 3.5–5.1)
Potassium: 3.7 mEq/L (ref 3.5–5.1)
Sodium: 138 mEq/L (ref 135–145)
Sodium: 136 mEq/L (ref 135–145)

## 2011-04-04 LAB — URINE MICROSCOPIC-ADD ON

## 2011-04-04 LAB — DIFFERENTIAL
Basophils Absolute: 0 10*3/uL (ref 0.0–0.1)
Eosinophils Relative: 3 % (ref 0–5)
Lymphocytes Relative: 20 % (ref 12–46)
Lymphs Abs: 1.3 10*3/uL (ref 0.7–4.0)
Monocytes Absolute: 0.8 10*3/uL (ref 0.1–1.0)
Neutro Abs: 4.5 10*3/uL (ref 1.7–7.7)

## 2011-04-25 ENCOUNTER — Telehealth: Payer: Self-pay | Admitting: *Deleted

## 2011-04-25 ENCOUNTER — Encounter: Payer: Self-pay | Admitting: Internal Medicine

## 2011-04-25 ENCOUNTER — Ambulatory Visit (INDEPENDENT_AMBULATORY_CARE_PROVIDER_SITE_OTHER): Payer: BC Managed Care – PPO | Admitting: Internal Medicine

## 2011-04-25 VITALS — BP 142/100 | HR 78 | Temp 98.9°F | Wt 155.0 lb

## 2011-04-25 DIAGNOSIS — R030 Elevated blood-pressure reading, without diagnosis of hypertension: Secondary | ICD-10-CM

## 2011-04-25 DIAGNOSIS — F988 Other specified behavioral and emotional disorders with onset usually occurring in childhood and adolescence: Secondary | ICD-10-CM

## 2011-04-25 DIAGNOSIS — L089 Local infection of the skin and subcutaneous tissue, unspecified: Secondary | ICD-10-CM

## 2011-04-25 DIAGNOSIS — D869 Sarcoidosis, unspecified: Secondary | ICD-10-CM

## 2011-04-25 MED ORDER — DOXYCYCLINE HYCLATE 100 MG PO TABS: 100.0000 mg | ORAL_TABLET | Freq: Two times a day (BID) | ORAL | Status: DC

## 2011-04-25 MED ORDER — TRIAMTERENE-HCTZ 37.5-25 MG PO TABS: ORAL_TABLET | ORAL | Status: DC

## 2011-04-25 MED ORDER — AMPHETAMINE-DEXTROAMPHET ER 20 MG PO CP24: ORAL_CAPSULE | ORAL | Status: DC

## 2011-04-25 MED ORDER — DOXYCYCLINE HYCLATE 100 MG PO TABS: 100.0000 mg | ORAL_TABLET | Freq: Two times a day (BID) | ORAL | Status: AC

## 2011-04-25 NOTE — Telephone Encounter (Signed)
error 

## 2011-04-25 NOTE — Patient Instructions (Signed)
Take antibiotic if not getting better If your BP is consistently. 140/90 and above then begin  Medication.   Either way recheck in about 6 weeks or so.

## 2011-04-25 NOTE — Progress Notes (Signed)
  Subjective:    Patient ID: Barry Silva, male    DOB: [birth year], 50 y.o.   MRN: [id number]  HPI Patient comes in today for an acute problem but a couple other issues.   Chigger bite about 10 days ago   On right leg ankle and itched  now larger  Area getting worse with mild tenderenss no weeping   Larger.No other acute rashes.   No treatment given.  No personal history of MRSA   Also needs adhd meds   Taking 40 mg per day.   Of the Adderall without significant side effect. He still thinks it's helpful to organize his day. Reviewed med with patient. BP readings   :   Not checking. Sleeping ok.    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 Things are looking better but there was a lot of stress going on this past year. The chest pain shortness of breath. He believes his cough from the sarcoid is a lot better.   Review of Systems Negative for major vision change hearing chest pain shortness of breath wheezing new joint problem bleeding swollen glands night sweats. No G.I. Or GU changes. Rest negative   Mood stable   Past history family history social history reviewed in the electronic medical record.     Objective:   Physical Exam WDWN in nad  HEENT grossly normal  No adenopathy Chest:  Clear to A&P without wheezes rales or rhonchi CV:  S1-S2 no gallops or murmurs peripheral perfusion is normal  Blood pressure readings on repeat 145/90 range SkinL at the base posterior right lower leg near ankle there is a central point without postural and about 4 cm regular redness without streaking vesicle or abscess. Range of motion of joints is normal. Is minimally tender. Neuro grossly nl .       Assessment & Plan:  POss infected bug bite .  Not that painful but progressive rash that does not look like EM will treat empirically  For secondary infection. Local care recommended to follow up if persistent and progressive.  ADD:  Okay to continue on current  medication no significant side effect of medication although we are still monitoring of blood pressure see below will refill today.  Elevated BP : seems to be creeping up for various reasons. Discussed importance of treatment if elevated although he can remain on his stimulant medication. Continue healthy lifestyle as best as possible. We may need to start medication but am voiding an Ace inhibitor to avoid potential side effect of cough that would make his symptom confusing regard to a sarcoid diagnosis.  Prescription given for  Low-dose diuretic today  Sarcoid  According to patient  His cough is much better since all the above treatment.

## 2011-04-29 ENCOUNTER — Ambulatory Visit: Payer: Self-pay | Admitting: Internal Medicine

## 2011-05-03 NOTE — Assessment & Plan Note (Signed)
Barry Silva                             PULMONARY OFFICE NOTE   NAME:Barry Silva, Barry Silva                      MRN:          [id number]  DATE:05/08/2007                            DOB:          1961-04-07    I saw Barry Silva to follow up today for his primary sarcoidosis, as well  as to review the results of his repeat pulmonary function test.   His pulmonary function tests were done April 17, 2007 and this showed a  post bronchodilator FEV1 to FVC ratio of 70%, but than his FEV1 was 4.24  which was 128% predicted.  His FEC was 6.09, which is 129% predicted.  There was no significant bronchodilators response.  His total lung  capacity was 8.24 which is 128% predicted and his diffusing capacity was  106% of predicted.   He continues to have symptoms of cough, but he is also having symptoms  of nasal congestion with post nasal drip, which has gotten worse  recently.  He says he does not notice much as far as dyspnea, chest  tightness or wheezing.   His current medications are:  1. Prednisone 5 mg daily.  2. Veramyst 2 sprays daily   PHYSICAL EXAMINATION:  He is 167 pounds.  Temperature is 97.9.  Blood  pressure is 112/84, heart rate is 77.  Oxygen saturation 97% room air.  HEENT:  There is no sinus tenderness.  He has a clear nasal discharge.  He has mild erythema of the posterior pharynx. There is no  lymphadenopathy.  HEART:  S1, S2, regular rate and rhythm.  CHEST:  There was no wheezing or rales.  ABDOMEN:  Was soft, nontender, positive bowel sounds.  EXTREMITIES:  No edema/clubbing.   ASSESSMENT:  1. 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  I will continue him on his current regimen of prednisone,      although I again would like to either decrease the dose of this or      perhaps consider adding a second agent, although Barry Silva again is      quite reluctant to do this at the present time.  2. Rhinitis  with a possible allergic component.  I will try him on      xyzal 5 mg daily and continue him on Veramyst and nasal irrigation      to see if this helps with his symptoms of rhinitis, post nasal drip      and possibly his cough as well.  3. Chronic cough.  He does work in [employer] and there is a possibility      he could have underlying reactive airway disease from occupational      exposure.  Again his most recent pulmonary      function tests were essentially normal, although consideration may      need to be given to doing a broncho provocation challenge test.   I will follow up with him in approximately 1 month.     Barry Helling, MD  Electronically Signed  VS/MedQ  DD: 05/15/2007  DT: 05/16/2007  Job #: [id number]

## 2011-05-03 NOTE — Assessment & Plan Note (Signed)
[facility]                             PULMONARY OFFICE NOTE   NAME:Barry Silva, Barry Silva                      MRN:          [id number]  DATE:10/08/2007                            DOB:          29-Nov-1961    I saw Barry Silva in followup today for his pulmonary sarcoidosis, chronic  cough and rhinitis.  He continues to use prednisone at 5 mg every day.  He says that he was bitten by his cat several weeks ago and was treated  with antibiotics for that.  At that time he was advised to stop his  prednisone.  He said that when he stopped this he noticed that his cough  became worse.  He has also stopped using his Vitamin C but has not  noticed any difference in his cough.  He was recently diagnosed with ADD  and started on Adderall.   CURRENT MEDICATIONS:  1. Prednisone 5 mg daily.  2. Adderall 20 mg daily.   PHYSICAL EXAM:  He is 164 pounds, temperature 99, blood pressure is  128/74, heart rate is 91, oxygen saturation is 97% on room air.  HEENT:  There is no sinus tenderness, no nasal discharge, no oral  lesions, no lymphadenopathy.  HEART:  Regular S1 S2.  CHEST:  Coarse breath sounds but no wheezing or rales.  ABDOMEN:  Soft, nontender.  EXTREMITIES:  No edema.   IMPRESSION:  1. Pulmonary sarcoidosis.  I advised him to try to cut his prednisone      dose down to 5 milligrams every other day and monitor his symptom      status.  2. Rhinitis.  He is not currently on any medications and I advised him      to monitor his symptom status.   I will follow up with him in approximately 2 months.     Coralyn Helling, MD  Electronically Signed    VS/MedQ  DD: 10/08/2007  DT: 10/08/2007  Job #: [id number]   cc:   Neta Mends. Fabian Sharp, MD

## 2011-05-03 NOTE — Assessment & Plan Note (Signed)
[facility]                             PULMONARY OFFICE NOTE   NAME:Silva, Barry EICH                      MRN:          [id number]  DATE:06/11/2007                            DOB:          16-Sep-1961    PULMONARY FOLLOW-UP VISIT:  I saw Barry Silva today in followup for his  pulmonary sarcoidosis, chronic cough, and rhinitis.   He continues to use 5 mg of prednisone daily.  He tried using the  Verimist and the nasal irrigation but did not notice much benefit from  this.  He said that the [facility] did help, but this caused him having  problems feeling sleepy.  As a result, he had stopped this about 3-4  days ago.  He says interestingly enough, however, that he had started  using vitamin C 1000 mg b.i.d.  Since he has been doing this, his cough  symptoms have improved considerably.   CURRENT MEDICATIONS:  1. Prednisone 5 mg daily.  2. Vitamin C 1000 mg b.i.d.   PHYSICAL EXAMINATION:  VITAL SIGNS:  He is 167 pounds.  Temperature is  98.3, blood pressure 126/90, heart rate 75, oxygen saturation 97% on  room air.  HEENT:  No sinus tenderness.  No nasal discharge.  No oral lesions.  NECK:  No lymphadenopathy.  HEART:  S1 and S2.  CHEST:  No wheezing or rales.  ABDOMEN:  Thin, soft, nontender.  EXTREMITIES:  No edema.   IMPRESSION:  1. Pulmonary sarcoid:  I will attempt to decrease the dose of his      prednisone to 5 mg every other day so long as he is tolerant of      this.  2. Rhinitis with chronic cough:  It is difficult to say whether his      cough is related to his sarcoid or a separate process.  Since he      did seem to notice the benefit from the use of his Tonita Phoenix, he may      still have a component of allergic rhinitis/postnasal drip.  He      was, however, intolerant of antihistamines due to the sedating      effect.  He also did not notice much benefit from the use of      inhaled corticosteroids nasally.  I will therefore try him on  Singulair 10 mg nightly to see if he gains any symptomatic relief      from this.  I advised him to start the Singulair and use this for      one week before decreasing his dose of prednisone.  I have also      advised him that he could continue to use his vitamin C as he      currently is but then to      maybe try and taper the dose of this as well to assess which      combination of medications is most beneficial.   I will follow up with him in about six weeks.     Coralyn Helling, MD  Electronically Signed  VS/MedQ  DD: 06/11/2007  DT: 06/12/2007  Job #: [id number]

## 2011-05-06 NOTE — Assessment & Plan Note (Signed)
[facility]                             PULMONARY OFFICE NOTE   NAME:Cockrell, AUBRA PAPPALARDO                      MRN:          [id number]  DATE:04/17/2007                            DOB:          Jun 09, 1961    I saw Mr. Egle today in followup for his pulmonary sarcoidosis.   He says that over the last 2-3 weeks, he has been having worsening cough  productive of yellow sputum. He was seen in an urgent care center and  was given a prescription for what I believe is azithromycin which he  took for 5 days. He says that this helped to some degree but he is still  having persistent symptoms. He is also having nasal congestion with post  nasal drip. He is not having any worsening of his breathing. He denies  having any fevers, chills or sweats. He did have some nausea and a few  episodes of loose bowel movements. He denies any abdominal pain. He is  not having any skin rashes or leg swelling. He says he has been getting  more frequent headaches but he believes that this is related to peanut  butter which he says contributes to his symptoms of his migraines.   CURRENT MEDICATIONS:  Prednisone 5 mg daily.   PHYSICAL EXAMINATION:  VITAL SIGNS:  He is 170 pounds, temperature is  98.3, blood pressure is 136/94, heart rate is 59. Oxygen saturation is  97% on room air.  HEENT:  There was mild tenderness of his maxillary sinuses. There was  mild erythema of the nasal mucosa but there was no nasal discharge. He  has mild erythema in the posterior pharynx but there were no oral  lesions. There was no lymphadenopathy.  HEART:  S1, S2.  CHEST:  There was no wheezing or rales.  ABDOMEN:  Thin, soft, nontender.  EXTREMITIES:  No edema.   Pulmonary function testing in my office today showed a post  bronchodilator FEV1/FEC ratio of 70%. The FEV1 was 4.24 liters which was  118% of predicted. The FEC was 6.09 liters which was 129% of predicted.  There was no significant  bronchodilator response. Total lung capacity  was 8.24 liters which was 128% of predicted. Diffusion capacity was 27.4  which was 106% of predicted and there was no significant change compared  to pulmonary function tests done October 17, 2005.   IMPRESSION:  1. Acute bronchitis with sinus congestion. I have given him another      sample and a prescription for Veramyst and instructions for the use      of this. I have also instructed him to use nasal irrigation. I will      give him an additional course of antibiotics with Avelox 400 mg 1      p.o. daily for 10 days. I have advised him that if he notices any      worsening of symptoms that he should seek immediate medical      attention and that he should not discontinue his prednisone without      discussing the matter with me first.  2. Pulmonary sarcoid. He remains steroid dependent mostly due to his      symptoms of chronic coughing. He did have persistent changes of      sarcoid on the CT scan although this was      somewhat better than previous imaging studies. What I will do is      reassess his symptoms      status at the next followup in approximately 3-4 weeks and then      decide whether we could try to gradual taper him off the prednisone      again versus having steroid sparing agents.     Coralyn Helling, MD  Electronically Signed    VS/MedQ  DD: 04/17/2007  DT: 04/17/2007  Job #: [id number]

## 2011-05-06 NOTE — Op Note (Signed)
NAMEGILMER, KAMINSKY               ACCOUNT NO.:  [id number]   MEDICAL RECORD NO.:  [id number]          PATIENT TYPE:  AMB   LOCATION:  ENDO                         FACILITY:  [facility]   PHYSICIAN:  Coralyn Helling, M.D.      DATE OF BIRTH:  08-19-1961   DATE OF PROCEDURE:  09/28/2005  DATE OF DISCHARGE:                                 OPERATIVE REPORT   PROCEDURE PERFORMED:  Bronchoscopy.   INDICATIONS FOR PROCEDURE:  Chronic cough, rule out sarcoidosis.   POSTPROCEDURE DIAGNOSIS:  Cough,  rule out sarcoidosis.   SURGEON:  Coralyn Helling, M.D.   DESCRIPTION OF PROCEDURE:  The patient was given 6 mg of IV Versed and 50 mg  of Demerol for anesthetic.  He was given topical viscous lidocaine and  Hurricaine spray for anesthetic for the nasal passage and the posterior  pharynx.  The bronchoscope was then entered through the right naris.  The  vocal cords were visualized and had good mobility.  2 mL of 1% lidocaine  were then instilled.  The bronchoscope was then entered into the main  bronchus.  The left main bronchus was then entered.  The openings for the  left upper lobe lingula and left lower lobes were all visualized and no  endobronchial lesions were seen.  The bronchoscope was then retracted and  the right main bronchus was then entered.  The right lower lobe and right  middle lobe orifice were visualized and no endobronchial lesions were seen.  The right upper lobe orifice was then entered.  No endobronchial lesions  were visible.  50 mL of saline were instilled into the right upper lobe for  bronchoalveolar lavage.  Then several biopsy specimens were taken from the  apical segment of the right upper lobe.  There was minimal bleeding seen  after biopsy which resolved.  There were no other immediate complications.  The patient was transported back to the recovery room and we will obtain a  post procedure x-ray and then I will follow up with him in my office in two  to three days after  reviewing the results of the biopsy.      Coralyn Helling, M.D.  Electronically Signed     VS/MEDQ  D:  09/28/2005  T:  09/28/2005  Job:  [id number]

## 2011-05-06 NOTE — Assessment & Plan Note (Signed)
[facility]                             PULMONARY OFFICE NOTE   NAME:Barry Silva, Barry Silva                      MRN:          [id number]  DATE:02/21/2007                            DOB:          06-22-1961    I saw [name] today for followup of his stage III pulmonary sarcoid.   I had attempted to decrease his dose of prednisone at his last visit.  However, he says that he had developed poison ivy, and as a result, was  given a prednisone taper.  As he was tapering back down to 5 mg every  other day, he noticed that his coughing started to recur, and as a  result he had placed himself back on prednisone 5 mg a day, which he has  been on since then.  He had tried using the Veramyst nasal spray, but  apparently had only used this for about 1 week, as he did not really  notice much of a difference from it.  He also tried using the Xopenex  inhaler, but did not notice much benefit from that either.  His symptoms  currently consist of a feeling like he has to clear his throat, which he  says seems to be worse during the night, but does happen during the day  as well.   PHYSICAL EXAMINATION:  He is 170 pounds, temperature 98, blood pressure  140/100, heart rate is 77, oxygen saturation 98% on room air.  HEENT:  There is no sinus tenderness, there is a clear nasal discharge,  it was mildly erythematous at the posterior pharynx with no  lymphadenopathy.  HEART:  S1, S2.  CHEST:  There was no wheezing or rales.  ABDOMEN:  Soft, nontender.  EXTREMITIES:  No edema.   IMPRESSION:  Stage III pulmonary sarcoid with persistent cough.  I will  continue him on his prednisone at 5 mg a day for now.  I have also  instructed him to try using his Veramyst nasal spray on a regular basis  for a more prolonged period of time, for at least 3-4 weeks, to see if  he gains a symptomatic benefit from this.  I have also instructed him on  how to use nasal irrigation.  I will  have him undergo a chest x-ray in  my office today, as well as arrange for him to have followup pulmonary  function testing.  If his cough is persistent after this, then  consideration may need to be given to evaluating him for possible reflux  disease, as well as to possibly consider having him undergo broncho  provocative testing to further assess the possibility of asthma,  although this seems less likely.   I will follow up with him in approximately 2-3 months.     Coralyn Helling, MD  Electronically Signed    VS/MedQ  DD: 02/21/2007  DT: 02/21/2007  Job #: [id number]

## 2011-05-06 NOTE — Assessment & Plan Note (Signed)
[facility]                             PULMONARY OFFICE NOTE   NAME:Barry Silva, Barry Silva                      MRN:          [id number]  DATE:03/01/2007                            DOB:          03-13-1961    I had called Barry Silva to discuss the results of his chest x-ray. There  was a concern for the possibility of a new right upper lobe nodule in  addition to his background sarcoidosis. I will make arrangements for him  to undergo a CT scan of the chest with contrast and then after review of  this further recommendations would follow.     Coralyn Helling, MD  Electronically Signed    VS/MedQ  DD: 03/01/2007  DT: 03/03/2007  Job #: [id number]

## 2011-05-06 NOTE — Assessment & Plan Note (Signed)
[facility]                               PULMONARY OFFICE NOTE   NAME:Barry Silva, JACOBO MONCRIEF                      MRN:          [id number]  DATE:09/27/2006                            DOB:          September 04, 1961    I saw Mr. Sliva in follow up today for his stage III pulmonary sarcoid as  well as his history of hypercalciuria with nephrolithiasis.  He had taken  himself down to as low as 10 mg of prednisone daily, but he ran out of his  prednisone approximately 3 weeks ago.  He says that his symptoms had  improved on the prednisone but then since being off the prednisone, he has  noticed that he is getting more symptoms of coughing and the urge to clear  his throat which is similar to his symptoms prior to his diagnosis of  sarcoid but not as severe.  He says that he has also noticed that his sleep  quality has gotten worse since being off the prednisone, which again  happened prior to his diagnosis.  He has also started mountain biking again,  and he has noticed that towards the end of his exercise regimen, he seems to  have a stronger urge to cough and clear his throat, although he denies  having any wheezing, sputum production, or chest tightness.   CURRENT MEDICATIONS:  He is not currently taking any medications at the  present time.   PHYSICAL EXAMINATION:  VITAL SIGNS:  He is 175 pounds.  Temperature is 98.  Blood pressure is 120/80.  Heart rate is 83.  Oxygen saturation 97% on room  air.  HEENT:  There is no sinus tenderness, no nasal discharge, no oral lesions,  no lymphadenopathy.  CARDIAC:  S1, S2.  CHEST:  He had broncho breath sounds more prominent over the upper lobes,  especially on the right.  ABDOMEN:  Soft, nontender.  EXTREMITIES:  No edema.   IMPRESSION:  Stage III pulmonary sarcoid.  I will reinitiate him on  prednisone at 20 mg daily for 1 week and then taper down to 15 mg daily for  1 week, then 10 mg daily for 1 week, and then 5  mg daily for 1 week,  eventually tapering to 5 mg every other day if he can tolerate this.  I have  also given him a sample of Xopenex and have advised him to use this prior to  his exercise regimen to determine if he gains any symptomatic benefit from  this, as he may also have a possible component of asthma.  I have also  advised him to have his follow up with his primary care physician for follow  up of his renal function as well as his laboratory tests and assessment of  his bone density and have also advised him to have follow up with his  ophthalmologist for his annual eye exam, and I plan on following up with him  in approximately 2 months to check his  symptom status and depending upon how he is at that time, I will determine  if  he will need to have repeat chest x-ray as well as pulmonary function  tests.       Coralyn Helling, MD   VS/MedQ DD:  09/27/2006 DT:  09/28/2006 Job #:  [id number]

## 2011-05-06 NOTE — Assessment & Plan Note (Signed)
[facility]                             PULMONARY OFFICE NOTE   NAME:Shrader, MADOX CORKINS                      MRN:          [id number]  DATE:12/04/2006                            DOB:          October 21, 1961    PULMONARY FOLLOWUP VISIT   I saw Mr. Grabel today in followup for his stage III pulmonary sarcoid.   He is currently on prednisone at 5 mg a day.  He seems to be doing  reasonably well.  He says that his main symptom is that he still has an  occasional cough but this is not as bad as before.  He said that he  notices his cough would be worse when he would try doing some mountain  biking but he has not been able to do this for the last several months,  so he is not sure exactly how bad this is at the present time.  He had  tried using Xopenex prior to his exercise.  He says that the only  difference he noted from this was that he was able to produce more  phlegm but this did not seem to affect the amount or severity of his  cough.  He thinks he may have some postnasal drip symptoms however.  Otherwise, he seems to be doing reasonably well.   PHYSICAL EXAMINATION:  He is 167 pounds.  Temperature is 98.  Blood  pressure is 110/68.  Heart rate is 64.  Oxygen saturation is 97% on room  air.  HEENT:  There is no sinus tenderness.  No nasal discharge.  No oral  lesions.  No lymphadenopathy.  HEART:  S1, S2.  LUNGS:  Clear to auscultation.  ABDOMEN:  Soft, nontender.  EXTREMITIES:  No edema.   IMPRESSION:  Stage III pulmonary sarcoid.  We will have him decrease his  dose of prednisone to 5 mg every other day and monitor his symptom  status.  He may have some degree of postnasal drip which can be  contributing to his cough symptoms as well, and I have given him a  sample of Veramyst 2 sprays in each nostril once daily as well as  instructing him on the use of nasal irrigation.  I will also have him  use his Xopenex as needed prior to his exercise regimen  and I will  follow up with him in approximately 2 months at which time I would  consider if he would need to have a repeat chest x-ray or pulmonary  function tests as well as possibly undergoing bronchoprovocation  testing.     Coralyn Helling, MD  Electronically Signed    VS/MedQ  DD: 12/04/2006  DT: 12/04/2006  Job #: [id number]

## 2011-05-18 ENCOUNTER — Ambulatory Visit: Payer: Self-pay | Admitting: Internal Medicine

## 2011-06-08 ENCOUNTER — Encounter: Payer: Self-pay | Admitting: Internal Medicine

## 2011-06-08 ENCOUNTER — Ambulatory Visit (INDEPENDENT_AMBULATORY_CARE_PROVIDER_SITE_OTHER): Payer: BC Managed Care – PPO | Admitting: Internal Medicine

## 2011-06-08 VITALS — BP 160/110 | HR 100 | Wt 151.0 lb

## 2011-06-08 DIAGNOSIS — G43909 Migraine, unspecified, not intractable, without status migrainosus: Secondary | ICD-10-CM

## 2011-06-08 DIAGNOSIS — F439 Reaction to severe stress, unspecified: Secondary | ICD-10-CM

## 2011-06-08 DIAGNOSIS — G4733 Obstructive sleep apnea (adult) (pediatric): Secondary | ICD-10-CM

## 2011-06-08 DIAGNOSIS — F988 Other specified behavioral and emotional disorders with onset usually occurring in childhood and adolescence: Secondary | ICD-10-CM

## 2011-06-08 DIAGNOSIS — R030 Elevated blood-pressure reading, without diagnosis of hypertension: Secondary | ICD-10-CM

## 2011-06-08 DIAGNOSIS — Z733 Stress, not elsewhere classified: Secondary | ICD-10-CM

## 2011-06-08 DIAGNOSIS — D869 Sarcoidosis, unspecified: Secondary | ICD-10-CM

## 2011-06-08 MED ORDER — AMPHETAMINE-DEXTROAMPHET ER 20 MG PO CP24: ORAL_CAPSULE | ORAL | Status: DC

## 2011-06-08 NOTE — Progress Notes (Signed)
  Subjective:    Patient ID: Barry Silva, male    DOB: [birth year], 50 y.o.   MRN: [id number]  HPI Patient comes in today for followup of his blood pressure and to get his medications for ADD. This morning he Just got bad news  as his brother-in-law has cancer that is now terminal..  Since his last visit he Didn't take blood pressure  med ... Forgot about this ..since  moved  has been unable to locate power supply for bp ashamed so he has not been monitoring his blood pressure. .     Review of Systems Cough with dairy he really relates this to drinking a large amount of dairy products.  No cp sob.  No usual headaches but did have a migraine and took Excedrin Migraine. He does not feel that this is out of the ordinary. No nausea vomiting other change in his health.   Past history family history social history reviewed in the electronic medical record.     Objective:   Physical Exam BP 150/100 left  160/98 right  Chest:  Clear to A&P without wheezes rales or rhonchi CV:  S1-S2 no gallops or murmurs peripheral perfusion is normal Oriented x 3 and no noted deficits in memory, attention, and speech. Some stress .  But appropriate eye contact. Neurologically no focal deficits.        Assessment & Plan:  HT  even if this is stress related blood pressure and weight coat effect his blood pressure is too high at the visit today. Discussed this with him at length. Stimulant medicine should not be given when his blood pressure is out of control. We will refill this medication today with his promise that he will either take the medication or if blood pressure will be in control. He will work on this and contact us with his readings. ADHD  as above we'll refill for one month only no more refills until his blood pressure is attended to.   STRESS  Mutiple external l. Tolerating actually fairly well. Intermittent cough think from dairly and not sarcoid  Either way is stable.   Total visit > 50%  spent counseling and coordinating care

## 2011-06-08 NOTE — Patient Instructions (Signed)
Your BP is too high today..   Today  You absolutely need to  Document  Good readings at home and if not take the medication and sometimes we need to adjust this.  Call   In 2 weeks about your readings and if you are taking  the medication  And then we can plan follow up.    Will not refill the adderall without  Your BP being controlled.

## 2011-06-11 ENCOUNTER — Encounter: Payer: Self-pay | Admitting: Internal Medicine

## 2011-06-11 DIAGNOSIS — G4733 Obstructive sleep apnea (adult) (pediatric): Secondary | ICD-10-CM | POA: Insufficient documentation

## 2011-06-11 DIAGNOSIS — IMO0001 Reserved for inherently not codable concepts without codable children: Secondary | ICD-10-CM | POA: Insufficient documentation

## 2011-06-11 DIAGNOSIS — F439 Reaction to severe stress, unspecified: Secondary | ICD-10-CM | POA: Insufficient documentation

## 2011-06-11 DIAGNOSIS — N200 Calculus of kidney: Secondary | ICD-10-CM | POA: Insufficient documentation

## 2011-06-11 DIAGNOSIS — D869 Sarcoidosis, unspecified: Secondary | ICD-10-CM | POA: Insufficient documentation

## 2011-06-11 DIAGNOSIS — Z531 Procedure and treatment not carried out because of patient's decision for reasons of belief and group pressure: Secondary | ICD-10-CM | POA: Insufficient documentation

## 2011-06-11 DIAGNOSIS — G43909 Migraine, unspecified, not intractable, without status migrainosus: Secondary | ICD-10-CM | POA: Insufficient documentation

## 2011-07-19 ENCOUNTER — Telehealth: Payer: Self-pay | Admitting: *Deleted

## 2011-07-19 NOTE — Telephone Encounter (Signed)
BP readings: am 140/95 to 145/100, evenings and weekends 125/85 to 128/88

## 2011-07-20 NOTE — Telephone Encounter (Signed)
Left message on machine about below. 

## 2011-07-20 NOTE — Telephone Encounter (Signed)
Continue the medication and take her blood pressure readings. [name] in a month and bring BP machine   To the visit

## 2011-08-08 ENCOUNTER — Ambulatory Visit: Payer: BC Managed Care – PPO | Admitting: Internal Medicine

## 2011-08-08 ENCOUNTER — Telehealth: Payer: Self-pay | Admitting: *Deleted

## 2011-08-08 MED ORDER — TRIAMTERENE-HCTZ 37.5-25 MG PO TABS: ORAL_TABLET | ORAL | Status: DC

## 2011-08-08 MED ORDER — AMPHETAMINE-DEXTROAMPHET ER 20 MG PO CP24: ORAL_CAPSULE | ORAL | Status: DC

## 2011-08-08 NOTE — Telephone Encounter (Signed)
Called pt about a no show appt. Pt states that he called several times this am to try and cancel the appointment because of work. Pt was on hold for several minutes and couldn't stay on hold any longer. Pt states that he also needs a refill on adderall and bp medication. Pt aware that he could pick up the rx tomorrow.

## 2011-08-15 ENCOUNTER — Encounter: Payer: Self-pay | Admitting: Internal Medicine

## 2011-08-15 NOTE — Telephone Encounter (Signed)
Pt would like his bp med [facility]

## 2011-08-15 NOTE — Telephone Encounter (Signed)
rx sent

## 2011-09-26 ENCOUNTER — Other Ambulatory Visit: Payer: Self-pay | Admitting: Internal Medicine

## 2011-09-26 MED ORDER — AMPHETAMINE-DEXTROAMPHET ER 20 MG PO CP24: ORAL_CAPSULE | ORAL | Status: DC

## 2011-09-26 NOTE — Telephone Encounter (Signed)
Pt req refill of amphetamine-dextroamphetamine (ADDERALL XR, 20MG ,) 20 MG 24 hr capsule

## 2011-09-26 NOTE — Telephone Encounter (Signed)
Pt aware that rx will be ready to pick up in am.

## 2011-10-12 ENCOUNTER — Other Ambulatory Visit: Payer: Self-pay | Admitting: Internal Medicine

## 2011-11-03 ENCOUNTER — Telehealth: Payer: Self-pay | Admitting: Internal Medicine

## 2011-11-03 MED ORDER — AMPHETAMINE-DEXTROAMPHET ER 20 MG PO CP24: ORAL_CAPSULE | ORAL | Status: DC

## 2011-11-03 NOTE — Telephone Encounter (Signed)
Pt aware that rx will be ready tomorrow.

## 2011-11-03 NOTE — Telephone Encounter (Signed)
Pt is out of state in [place] for a few months and is needs to get refills for his amphetamine-dextroamphetamine (ADDERALL XR, 20MG ,) 20 MG 24 hr capsule. Pt says that his wife can pick up written scripts, and then mail to pt in [place].

## 2011-12-16 ENCOUNTER — Telehealth: Payer: Self-pay | Admitting: *Deleted

## 2011-12-16 MED ORDER — AMPHETAMINE-DEXTROAMPHET ER 20 MG PO CP24: ORAL_CAPSULE | ORAL | Status: DC

## 2011-12-16 NOTE — Telephone Encounter (Signed)
Wife came in to pick up rx that pt was suppose to call in but didn't. Wife is waiting for rx.

## 2012-01-11 ENCOUNTER — Other Ambulatory Visit: Payer: Self-pay | Admitting: Internal Medicine

## 2012-01-11 NOTE — Telephone Encounter (Signed)
Pt need name brand only adderall xr 20mg . Pt need rx sooner wife will bring rx to him.

## 2012-01-16 MED ORDER — ADDERALL XR 20 MG PO CP24: ORAL_CAPSULE | ORAL | Status: DC

## 2012-01-16 MED ORDER — AMPHETAMINE-DEXTROAMPHET ER 20 MG PO CP24: ORAL_CAPSULE | ORAL | Status: DC

## 2012-01-16 NOTE — Telephone Encounter (Signed)
Left message on machine that pt needs to schedule a follow up appt.

## 2012-03-15 ENCOUNTER — Telehealth: Payer: Self-pay | Admitting: Internal Medicine

## 2012-03-15 MED ORDER — TRIAMTERENE-HCTZ 37.5-25 MG PO TABS: ORAL_TABLET | ORAL | Status: DC

## 2012-03-15 NOTE — Telephone Encounter (Signed)
Per Dr. Fabian Sharp called to inform pt that maxide would be sent to pharmacy and Adderall would be filled at appt on 03/19/12.  Pt is aware.

## 2012-03-15 NOTE — Telephone Encounter (Signed)
Rx last filled 01/16/12.  Pt last seen 06/08/11. Pls advise.

## 2012-03-15 NOTE — Telephone Encounter (Signed)
Patient called stating that he need a refill on his adderall and his maxide. Please assist.

## 2012-03-19 ENCOUNTER — Encounter: Payer: Self-pay | Admitting: Internal Medicine

## 2012-03-19 ENCOUNTER — Ambulatory Visit (INDEPENDENT_AMBULATORY_CARE_PROVIDER_SITE_OTHER): Payer: BC Managed Care – PPO | Admitting: Internal Medicine

## 2012-03-19 VITALS — BP 132/86 | HR 110 | Temp 98.6°F | Ht 67.5 in | Wt 165.0 lb

## 2012-03-19 DIAGNOSIS — I10 Essential (primary) hypertension: Secondary | ICD-10-CM

## 2012-03-19 DIAGNOSIS — Z79899 Other long term (current) drug therapy: Secondary | ICD-10-CM

## 2012-03-19 DIAGNOSIS — D869 Sarcoidosis, unspecified: Secondary | ICD-10-CM

## 2012-03-19 DIAGNOSIS — F988 Other specified behavioral and emotional disorders with onset usually occurring in childhood and adolescence: Secondary | ICD-10-CM

## 2012-03-19 MED ORDER — ADDERALL XR 20 MG PO CP24: ORAL_CAPSULE | ORAL | Status: DC

## 2012-03-19 NOTE — Patient Instructions (Signed)
Take bp med  And continue.   Your bp is ok today but pulse is up.  Can refill medication for now .  CPX and labs in 2-3  months.

## 2012-03-19 NOTE — Progress Notes (Signed)
  Subjective:    Patient ID: Barry Silva, male    DOB: [birth year], 51 y.o.   MRN: [id number]  HPI  Patient comes in today for follow up of  multiple medical problems.  See recent phone notes  Was in [place]  bp was in  Mid 140s   Over 90s  And wasn't taking BP meds.  Started med again  Last week.  On diuretic     No hb in past.  Adderall  Pretty much every day usually 20  Or so  Off weekend.   20 mg  Helps most of the time takes  40 about a week out of the month.  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 Rest as per  hpi   Past history family history social history reviewed in the electronic medical record. Outpatient Prescriptions Prior to Visit  Medication Sig Dispense Refill  . triamterene-hydrochlorothiazide (MAXZIDE-25) 37.5-25 MG per tablet Take 1/2 tablet by mouth daily.  30 tablet  0  . ADDERALL XR, 20MG , 20 MG 24 hr capsule 2 po daily  60 capsule  0       Objective:   Physical Exam  BP 132/86  Pulse 110  Temp(Src) 98.6 F (37 C) (Oral)  Ht 5' 7.5" (1.715 m)  Wt 165 lb (74.844 kg)  BMI 25.46 kg/m2  SpO2 98% WDWN in nad Neck: Supple without adenopathy or masses or bruits Chest:  Clear to A&P without wheezes rales or rhonchi CV:  S1-S2 no gallops or murmurs peripheral perfusion is normal  Pulse repeat 96 rr  No clubbing cyanosis or edema Neuro non focal no tremor or fidgeting today    Oriented x 3 and no noted deficits in memory, attention, and speech. Wt Readings from Last 3 Encounters:  03/19/12 165 lb (74.844 kg)  06/08/11 151 lb (68.493 kg)  04/25/11 155 lb (70.308 kg)        Assessment & Plan:    ADHD  High risk meds help  A good bit  Elevated BP reviewed importance of control  Pulse up today  Unsure if anxiety has no sx   Due for labs and check up  Will do so in a few months.  Hx of sarcoid  Says mild made  him cough

## 2012-03-25 ENCOUNTER — Encounter: Payer: Self-pay | Admitting: Internal Medicine

## 2012-03-25 DIAGNOSIS — I10 Essential (primary) hypertension: Secondary | ICD-10-CM | POA: Insufficient documentation

## 2012-03-25 DIAGNOSIS — Z79899 Other long term (current) drug therapy: Secondary | ICD-10-CM | POA: Insufficient documentation

## 2012-05-02 ENCOUNTER — Other Ambulatory Visit: Payer: Self-pay | Admitting: Internal Medicine

## 2012-05-02 MED ORDER — ADDERALL XR 20 MG PO CP24: ORAL_CAPSULE | ORAL | Status: DC

## 2012-05-02 NOTE — Telephone Encounter (Signed)
Pt need adderall xr 20 mg

## 2012-05-02 NOTE — Telephone Encounter (Signed)
Spoke with pt and pt is aware rx ready for pick up.

## 2012-05-02 NOTE — Telephone Encounter (Signed)
Pt last seen 03/19/12.  Rx last filled 03/19/12.  Pls advise.

## 2012-05-02 NOTE — Telephone Encounter (Signed)
Ok to refill x 1  

## 2012-05-09 ENCOUNTER — Other Ambulatory Visit: Payer: Self-pay | Admitting: Internal Medicine

## 2012-05-15 ENCOUNTER — Other Ambulatory Visit: Payer: BC Managed Care – PPO

## 2012-05-17 ENCOUNTER — Other Ambulatory Visit: Payer: Self-pay

## 2012-05-17 ENCOUNTER — Telehealth: Payer: Self-pay | Admitting: Family Medicine

## 2012-05-17 DIAGNOSIS — I10 Essential (primary) hypertension: Secondary | ICD-10-CM

## 2012-05-17 DIAGNOSIS — Z0000 Encounter for general adult medical examination without abnormal findings: Secondary | ICD-10-CM

## 2012-05-17 NOTE — Telephone Encounter (Signed)
Labs ordered.

## 2012-05-17 NOTE — Telephone Encounter (Signed)
Pt will be going to [facility] tomorrow for CPX labs. Will you please put the orders in? Thanks!

## 2012-05-18 ENCOUNTER — Other Ambulatory Visit (INDEPENDENT_AMBULATORY_CARE_PROVIDER_SITE_OTHER): Payer: BC Managed Care – PPO

## 2012-05-18 DIAGNOSIS — I10 Essential (primary) hypertension: Secondary | ICD-10-CM

## 2012-05-18 DIAGNOSIS — Z0000 Encounter for general adult medical examination without abnormal findings: Secondary | ICD-10-CM

## 2012-05-18 LAB — HEPATIC FUNCTION PANEL
Alkaline Phosphatase: 74 U/L (ref 39–117)
Bilirubin, Direct: 0.2 mg/dL (ref 0.0–0.3)
Total Bilirubin: 1.5 mg/dL — ABNORMAL HIGH (ref 0.3–1.2)

## 2012-05-18 LAB — CBC WITH DIFFERENTIAL/PLATELET
Basophils Absolute: 0 10*3/uL (ref 0.0–0.1)
Eosinophils Absolute: 0.2 10*3/uL (ref 0.0–0.7)
Lymphocytes Relative: 24.6 % (ref 12.0–46.0)
MCHC: 34.2 g/dL (ref 30.0–36.0)
Monocytes Relative: 13.4 % — ABNORMAL HIGH (ref 3.0–12.0)
Neutrophils Relative %: 57.5 % (ref 43.0–77.0)
RBC: 5.28 Mil/uL (ref 4.22–5.81)
RDW: 12.8 % (ref 11.5–14.6)

## 2012-05-18 LAB — BASIC METABOLIC PANEL
CO2: 29 mEq/L (ref 19–32)
Chloride: 105 mEq/L (ref 96–112)
Creatinine, Ser: 1 mg/dL (ref 0.4–1.5)
Glucose, Bld: 93 mg/dL (ref 70–99)

## 2012-05-18 LAB — LIPID PANEL
HDL: 51.5 mg/dL (ref >39.00)
LDL Cholesterol: 134 mg/dL — ABNORMAL HIGH (ref 0–99)
Total CHOL/HDL Ratio: 4
Triglycerides: 69 mg/dL (ref 0.0–149.0)

## 2012-05-18 LAB — TSH: TSH: 0.88 u[IU]/mL (ref 0.35–5.50)

## 2012-05-22 ENCOUNTER — Ambulatory Visit (INDEPENDENT_AMBULATORY_CARE_PROVIDER_SITE_OTHER): Payer: BC Managed Care – PPO | Admitting: Internal Medicine

## 2012-05-22 ENCOUNTER — Encounter: Payer: Self-pay | Admitting: Internal Medicine

## 2012-05-22 VITALS — BP 148/98 | HR 113 | Temp 98.4°F | Ht 67.5 in | Wt 164.0 lb

## 2012-05-22 DIAGNOSIS — G4733 Obstructive sleep apnea (adult) (pediatric): Secondary | ICD-10-CM

## 2012-05-22 DIAGNOSIS — Z0000 Encounter for general adult medical examination without abnormal findings: Secondary | ICD-10-CM

## 2012-05-22 DIAGNOSIS — I10 Essential (primary) hypertension: Secondary | ICD-10-CM

## 2012-05-22 DIAGNOSIS — F988 Other specified behavioral and emotional disorders with onset usually occurring in childhood and adolescence: Secondary | ICD-10-CM

## 2012-05-22 LAB — POCT URINALYSIS DIPSTICK
Bilirubin, UA: NEGATIVE
Blood, UA: NEGATIVE
Glucose, UA: NEGATIVE
Ketones, UA: NEGATIVE
Nitrite, UA: NEGATIVE
Spec Grav, UA: 1.015

## 2012-05-22 MED ORDER — LISINOPRIL-HYDROCHLOROTHIAZIDE 10-12.5 MG PO TABS: 1.0000 | ORAL_TABLET | Freq: Every day | ORAL | Status: DC

## 2012-05-22 NOTE — Patient Instructions (Addendum)
Your blood pressure is elevated today could be from stress but we need to it readjust  Medication.  Change to  Lisinopril hctz 1 per day.  Monitor bp readings    [name] in 1-2 months or as needed .   Avoid taking 40 mg adderall after being off.

## 2012-05-22 NOTE — Progress Notes (Signed)
Subjective:    Patient ID: Barry Silva, male    DOB: 01-05-1961, 51 y.o.   MRN: [id number]  HPI  Patient comes in today for Preventive Health Care visit  Also followup of medical issues Blood pressure seems to be in the high 130s over 90s on half a dose of Maxzide a day. Up today feels from stress negative chest pain shortness of breath hasn't been exercising recently pressing around.  ADHD taking 1-2 of the 26th her Adderall a day. Today took 2 on days where he has to get a lot done. Denies side effects. Rarely goes from 0-2.  Back bothers him at times possibly related to mattress. Sarcoid coughing no respiratory symptoms feels that he had issues with milk in the past. Unsure when last x-ray was  Review of Systems ROS:  GEN/ HEENT: No fever, significant weight changes sweats headaches vision problems hearing changes, CV/ PULM; No chest pain shortness of breath cough, syncope,edema  change in exercise tolerance. GI /GU: No adominal pain, vomiting, change in bowel habits. No blood in the stool. No significant GU symptoms. SKIN/HEME: ,no acute skin rashes suspicious lesions or bleeding. No lymphadenopathy, nodules, masses. Toenail getting better takes it nails and some deformity although getting better. Has picked at his nails cuticles his whole life. NEURO/ PSYCH:  No neurologic signs such as weakness numbness. No depression anxiety. IMM/ Allergy: No unusual infections.  Allergy .   REST of 12 system review negative except as per HPI  Past history family history social history reviewed in the electronic medical record.      Objective:   Physical Exam Pulse 113  Temp(Src) 98.4 F (36.9 C) (Oral)  Ht 5' 7.5" (1.715 m)  Wt 164 lb (74.39 kg)  BMI 25.31 kg/m2  SpO2 98% Physical Exam: Vital signs reviewed DGU:YQIH is a well-developed well-nourished alert cooperative  White male  who appears   stated age in no acute distress.  HEENT: normocephalic  traumatic , Eyes: PERRL EOM's  full, conjunctiva clear, Nares: patent no deformity discharge or tenderness., Ears: no deformity EAC's clear TMs with normal landmarks. Mouth: clear OP, no lesions, edema.  Moist mucous membranes. Dentition in adequate repair. NECK: supple without masses, thyromegaly or bruits. CHEST/PULM:  Clear to auscultation and percussion breath sounds equal no wheeze , rales or rhonchi. No chest wall deformities or tenderness. CV: PMI is nondisplaced, S1 S2 no gallops, murmurs, rubs. Peripheral pulses are full without delay.No JVD .  Pulse 90 and regular  ABDOMEN: Bowel sounds normal nontender  No guard or rebound, no hepato splenomegal no CVA tenderness.  No hernia. Extremtities:  No clubbing cyanosis or edema, no acute joint swelling or redness no focal atrophy NEURO:  Oriented x3, cranial nerves 3-12 appear to be intact, no obvious focal weakness,gait within normal limits no abnormal reflexes or asymmetrical SKIN: No acute rashes normal turgor, color, no bruising or petechiae. Fingernails showed irritation at the base cuticle and some with midline deformity from picking no acute disease or infection. PSYCH: Oriented, good eye contact, no obvious depression anxiety, cognition and judgment appear normal. LN:  No cervical axillary or inguinal adenopathy  Lab Results  Component Value Date   WBC 5.0 05/18/2012   HGB 16.1 05/18/2012   HCT 47.2 05/18/2012   PLT 264.0 05/18/2012   GLUCOSE 93 05/18/2012   CHOL 199 05/18/2012   TRIG 69.0 05/18/2012   HDL 51.50 05/18/2012   LDLDIRECT 128.4 03/03/2010   LDLCALC 134* 05/18/2012   ALT 49  05/18/2012   AST 41* 05/18/2012   NA 140 05/18/2012   K 4.0 05/18/2012   CL 105 05/18/2012   CREATININE 1.0 05/18/2012   BUN 13 05/18/2012   CO2 29 05/18/2012   TSH 0.88 05/18/2012   INR 1.04 05/07/2010          Assessment & Plan:  Preventive Health Care Counseled regarding healthy nutrition, exercise, sleep, injury prevention, calcium vit d and healthy weight . Hasn't been exercising  recently because of busy schedule plans on doing a  Hypertension  elevated today on very low-dose diuretic. Some related to stress I think we should change his medication we'll switch to lisinopril HCTZ low-dose because of his past history of sarcoid and coughing did warn him that if he gets the coughing side effect we can stop right away he should call us about that. Consider are of. Or calcium channel blocker.  Family history of stroke and hypertension.  ADHD medication very helpful for him   Continue no untoward side effects although could be raising his blood pressure some  Intermittent back pain mechanical  back hygiene. History of mild obstructive sleep apnea that may be positional no other intervention at this time.

## 2012-06-14 ENCOUNTER — Telehealth: Payer: Self-pay | Admitting: Internal Medicine

## 2012-06-14 MED ORDER — ADDERALL XR 20 MG PO CP24: ORAL_CAPSULE | ORAL | Status: DC

## 2012-06-14 NOTE — Telephone Encounter (Signed)
Rx printed and [facility] signed.  Put up front for pt pickup.  He was notified by telephone.

## 2012-06-14 NOTE — Telephone Encounter (Signed)
Pt called req refill of ADDERALL XR, 20MG , 20 MG 24 hr capsule.

## 2012-07-23 ENCOUNTER — Ambulatory Visit (INDEPENDENT_AMBULATORY_CARE_PROVIDER_SITE_OTHER): Payer: BC Managed Care – PPO | Admitting: Internal Medicine

## 2012-07-23 ENCOUNTER — Encounter: Payer: Self-pay | Admitting: Internal Medicine

## 2012-07-23 VITALS — BP 120/88 | HR 107 | Temp 98.6°F | Wt 165.0 lb

## 2012-07-23 DIAGNOSIS — K1370 Unspecified lesions of oral mucosa: Secondary | ICD-10-CM

## 2012-07-23 DIAGNOSIS — Z87442 Personal history of urinary calculi: Secondary | ICD-10-CM

## 2012-07-23 DIAGNOSIS — D869 Sarcoidosis, unspecified: Secondary | ICD-10-CM

## 2012-07-23 DIAGNOSIS — F988 Other specified behavioral and emotional disorders with onset usually occurring in childhood and adolescence: Secondary | ICD-10-CM

## 2012-07-23 DIAGNOSIS — K1379 Other lesions of oral mucosa: Secondary | ICD-10-CM

## 2012-07-23 DIAGNOSIS — I10 Essential (primary) hypertension: Secondary | ICD-10-CM

## 2012-07-23 LAB — BASIC METABOLIC PANEL
BUN: 19 mg/dL (ref 6–23)
CO2: 27 mEq/L (ref 19–32)
Chloride: 100 mEq/L (ref 96–112)
Glucose, Bld: 72 mg/dL (ref 70–99)
Potassium: 3.8 mEq/L (ref 3.5–5.1)

## 2012-07-23 LAB — HEPATIC FUNCTION PANEL
ALT: 34 U/L (ref 0–53)
AST: 22 U/L (ref 0–37)
Total Bilirubin: 2.1 mg/dL — ABNORMAL HIGH (ref 0.3–1.2)

## 2012-07-23 NOTE — Progress Notes (Signed)
  Subjective:    Patient ID: Barry Silva, male    DOB: 07-02-1961, 51 y.o.   MRN: [id number]  HPI  Patient comes in today for follow up of  multiple medical problems.  BP  Readings usuallu 120/high 80 range  Feels fine no increase cough or resp problems.  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 No pain today. Past history family history social history reviewed in the electronic medical record. Outpatient Encounter Prescriptions as of 07/23/2012  Medication Sig Dispense Refill  . ADDERALL XR 20 MG 24 hr capsule Take 1-2 per day.as directed  60 capsule  0  . lisinopril-hydrochlorothiazide (PRINZIDE,ZESTORETIC) 10-12.5 MG per tablet Take 1 tablet by mouth daily.  90 tablet  1      Objective:   Physical Exam  BP 124/100  Pulse 107  Temp 98.6 F (37 C) (Oral)  Wt 165 lb (74.844 kg)  SpO2 98% Repeat blood pressure twice 118-120 systolic 86-90 diastolic  to readings  Well-developed well-nourished in no acute distress Neck without adenopathy cardiac S1-S2 gallops or murmurs . Neuro non focal no tremor or tics  Psych good eye contact   Affect normal  Some inc motor acitivy Wt Readings from Last 3 Encounters:  07/23/12 165 lb (74.844 kg)  05/22/12 164 lb (74.39 kg)  03/19/12 165 lb (74.844 kg)      Assessment & Plan:   HT  Better on prnizide  Consider intensify to 20 12.5 if not controlled  Bmp today if ok readings and rov 4 months but call in meantime for dose adjsutment if high readings. Hx of minor lft abnormality  Recheck today.  ADHD med s ok   Continue no cv sx   Sarcoid stable  Hx of mouth ulcers no systemic sx   Disc  Possibilities. See dentist.  Or [place] prn.   Hx of renal stones   Hematuria to fu with uro  Counseled. Avoid dehydration and  Overheating .

## 2012-07-23 NOTE — Patient Instructions (Signed)
Your blood pressure is better although the bottom number or diastolic is borderline 80-90.  Continue medication will let she know about lab results.  Monitor your blood pressure readings 3 times a week mail or fax them to [place]. Otherwise followup visit in 3 months.  If your readings are elevated contact us and we will adjust her medication over the phone.  Discuss with the dentist the situation about mouth ulcers sometimes they're irritated to do this such as special whiteners and things in your toothpaste or mouthwash sometimes there is no obvious cause.  Sometimes or other diseases that cause this but it don't think you have that at this point.

## 2012-07-31 ENCOUNTER — Telehealth: Payer: Self-pay | Admitting: Internal Medicine

## 2012-07-31 MED ORDER — ADDERALL XR 20 MG PO CP24: ORAL_CAPSULE | ORAL | Status: DC

## 2012-07-31 NOTE — Telephone Encounter (Signed)
Rx printed to be left upfront.  Patient notified by telephone.

## 2012-07-31 NOTE — Telephone Encounter (Signed)
Pt requesting refill on ADDERALL XR 20 MG 24 hr capsule

## 2012-09-05 ENCOUNTER — Telehealth: Payer: Self-pay | Admitting: Internal Medicine

## 2012-09-05 MED ORDER — ADDERALL XR 20 MG PO CP24: ORAL_CAPSULE | ORAL | Status: DC

## 2012-09-05 MED ORDER — LISINOPRIL-HYDROCHLOROTHIAZIDE 10-12.5 MG PO TABS: 1.0000 | ORAL_TABLET | Freq: Every day | ORAL | Status: DC

## 2012-09-05 NOTE — Telephone Encounter (Signed)
Patient called stating that he need a refill of his adderall and triam/hctz. His pharmacy is [facility] [address]. Please assist.

## 2012-09-05 NOTE — Telephone Encounter (Signed)
Printed for [facility] to sign.  Triam/hctz sent by e-scribe.

## 2012-09-11 ENCOUNTER — Telehealth: Payer: Self-pay | Admitting: Family Medicine

## 2012-09-11 NOTE — Telephone Encounter (Signed)
Would rather change to hyzaar 50 12.5   Another generic The cough should go away in 2-4 weeks.   Disp 30 and  refill x  2   [name] in 2 months or as needed

## 2012-09-11 NOTE — Telephone Encounter (Signed)
Patient called to inform us that he would like to switch back to triamterene - hctz.  Complains that lisinopril makes him cough.  Please advise.  Thanks!!

## 2012-09-12 MED ORDER — LOSARTAN POTASSIUM-HCTZ 50-12.5 MG PO TABS: 1.0000 | ORAL_TABLET | Freq: Every day | ORAL | Status: DC

## 2012-09-12 NOTE — Telephone Encounter (Signed)
New rx sent to the pharmacy.  Pt notified and he already has a fu with [facility].

## 2012-09-17 ENCOUNTER — Other Ambulatory Visit: Payer: Self-pay

## 2012-09-17 MED ORDER — LOSARTAN POTASSIUM-HCTZ 50-12.5 MG PO TABS: 1.0000 | ORAL_TABLET | Freq: Every day | ORAL | Status: DC

## 2012-09-17 NOTE — Telephone Encounter (Signed)
Pt needed rx for hyzzar sent to [facility] [address]

## 2012-10-08 ENCOUNTER — Telehealth: Payer: Self-pay | Admitting: Internal Medicine

## 2012-10-08 MED ORDER — ADDERALL XR 20 MG PO CP24: ORAL_CAPSULE | ORAL | Status: DC

## 2012-10-08 NOTE — Telephone Encounter (Signed)
Patient called stating that he need a refill of his adderall. Please assist.  °

## 2012-10-08 NOTE — Telephone Encounter (Signed)
Pt notified rx at the front for pick up.

## 2012-10-23 ENCOUNTER — Ambulatory Visit (INDEPENDENT_AMBULATORY_CARE_PROVIDER_SITE_OTHER): Payer: BC Managed Care – PPO | Admitting: Internal Medicine

## 2012-10-23 ENCOUNTER — Encounter: Payer: Self-pay | Admitting: Internal Medicine

## 2012-10-23 VITALS — BP 128/90 | HR 96 | Temp 98.6°F | Wt 167.0 lb

## 2012-10-23 DIAGNOSIS — Z2821 Immunization not carried out because of patient refusal: Secondary | ICD-10-CM

## 2012-10-23 DIAGNOSIS — I10 Essential (primary) hypertension: Secondary | ICD-10-CM

## 2012-10-23 DIAGNOSIS — F988 Other specified behavioral and emotional disorders with onset usually occurring in childhood and adolescence: Secondary | ICD-10-CM

## 2012-10-23 NOTE — Patient Instructions (Addendum)
Continue lifestyle intervention healthy eating and exercise . Continue medication as directed.   Continue to  Monitor your readings. [name] in 6 months or as needed.

## 2012-10-23 NOTE — Progress Notes (Signed)
Chief Complaint  Patient presents with  . Follow-up  . Hypertension    HPI: Pt comesin today as fu of BP meds and adhd  See last visit 3 months ago.  Has been on hyzaar and no cough now  bp readings monitored most 120 118 range with diastolic 80s and few in low 90 range .  Feels well No CV pulm sx at this time.  No noted se of med  Using mostly 20 mf adderall some days 40 mg  No se .  ROS: See pertinent positives and negatives per HPI.  Past Medical History  Diagnosis Date  . Migraine   . Sarcoidosis     bronchoscopy 2006, mtx started 04/19/10 try off 11/29/10 (atypical cp/ ? worsening ct chest w[facility]) off prednisone 7/11  . Refusal of blood transfusions as patient is Jehovah's Witness   . Nephrolithiasis     hx of episode 1/10 calcium stones  . ADD (attention deficit disorder with hyperactivity)     formal evaluation  . OSA (obstructive sleep apnea)     mild, Apnealink 7 2011 RDI7, O2 nadir 83%  . Fracture     femur 1984 residual nerve damage leg leg    Family History  Problem Relation Age of Onset  . Stroke      Grandfather father older age    History   Social History  . Marital Status: Married    Spouse Name: N/A    Number of Children: N/A  . Years of Education: N/A   Social History Main Topics  . Smoking status: Never Smoker   . Smokeless tobacco: None  . Alcohol Use:   . Drug Use:   . Sexually Active:    Other Topics Concern  . None   Social History Narrative   MarriedHH of 2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Had to move ;last year and sell house because of financial difficulties in business.Better now. M in law died last year was [facility] and wife caretaker. [place]    Current outpatient prescriptions:ADDERALL XR 20 MG 24 hr capsule, Take 1-2 per day.as directed, Disp: 60 capsule, Rfl: 0;  losartan-hydrochlorothiazide (HYZAAR) 50-12.5 MG per tablet, Take 1 tablet by mouth daily., Disp: 90 tablet, Rfl: 0  EXAM: BP 128/90   Pulse 96  Temp 98.6 F (37 C) (Oral)  Wt 167 lb (75.751 kg)  SpO2 98% There is no height on file to calculate BMI.  GENERAL: vitals reviewed and listed above, alert, oriented, appears well hydrated and in no acute distress  HEENT: atraumatic, conjunttiva clear, no obvious abnormalities on inspection of external nose and ears wears glasses   NECK: no obvious masses on inspection palpation   LUNGS: clear to auscultation bilaterally, no wheezes, rales or rhonchi, good air movement  CV: HRRR, no clubbing cyanosis or  peripheral edema nl cap refill  See bp readings   MS: moves all extremities without noticeable focal  abnormality  PSYCH: pleasant and cooperative, no obvious depression or anxiety  ASSESSMENT AND PLAN:  Discussed the following assessment and plan:  1. Hypertension    better ;some diastolic out or range .pt prefers to intesnify LSI get some white coat effect .   2. ADD    continue same med regimen  3. Influenza vaccination declined     -Patient advised to return or notify medical home immediately if symptoms worsen or persist or new concerns arise.  Patient Instructions  Continue lifestyle intervention  healthy eating and exercise . Continue medication as directed.   Continue to  Monitor your readings. [name] in 6 months or as needed.   Lorretta Harp

## 2012-10-24 ENCOUNTER — Encounter: Payer: Self-pay | Admitting: Internal Medicine

## 2012-11-20 ENCOUNTER — Telehealth: Payer: Self-pay | Admitting: Internal Medicine

## 2012-11-20 NOTE — Telephone Encounter (Signed)
Pt called req refill of ADDERALL XR 20 MG 24 hr capsule

## 2012-11-21 MED ORDER — ADDERALL XR 20 MG PO CP24: ORAL_CAPSULE | ORAL | Status: DC

## 2012-11-21 NOTE — Telephone Encounter (Signed)
Printed for [facility] to sign.  Pt notified to pick up rx at the front desk.

## 2012-11-26 ENCOUNTER — Telehealth: Payer: Self-pay | Admitting: Internal Medicine

## 2012-11-26 NOTE — Telephone Encounter (Signed)
Called the pharmacy and spoke to [place].  Gave permission to give generic.

## 2012-11-26 NOTE — Telephone Encounter (Signed)
Rx written for Adderall. Pt states he wants generic. Pharm needs an "ok" to dispense generic. Please call.

## 2012-12-18 ENCOUNTER — Other Ambulatory Visit: Payer: Self-pay | Admitting: Internal Medicine

## 2013-01-10 ENCOUNTER — Telehealth: Payer: Self-pay | Admitting: Internal Medicine

## 2013-01-10 MED ORDER — ADDERALL XR 20 MG PO CP24: ORAL_CAPSULE | ORAL | Status: DC

## 2013-01-10 NOTE — Telephone Encounter (Signed)
Patient called stating that he need a refill of his adderall xr 20mg  take 1-2 per day as directed. Please assist.

## 2013-01-10 NOTE — Telephone Encounter (Signed)
Patient notified to pick up at the front desk. 

## 2013-01-17 ENCOUNTER — Telehealth: Payer: Self-pay | Admitting: Internal Medicine

## 2013-01-17 NOTE — Telephone Encounter (Signed)
Patient's rx for Adderall written 01/10/13 staes "take1-2 pills daily". Because it is written this way, the insurance will only pay for 30. Please re-write andre submitt for "2 per day".  Patient picked up 30 pills this week, but they could not give him the other 30.  Pharmacy: [facility] [address]

## 2013-01-18 MED ORDER — AMPHETAMINE-DEXTROAMPHETAMINE 20 MG PO TABS: ORAL_TABLET | ORAL | Status: DC

## 2013-01-18 NOTE — Telephone Encounter (Signed)
Pt needs new script for Adderall due to insurance can only have Adderall XR 30 pills for 30 days. Please advise.

## 2013-01-18 NOTE — Telephone Encounter (Signed)
Spoke to pt told him new Rx here for pick up due to insurance will not cover Adderall XR for 60 tablets only 30 at a time. So Rx changed to Adderall 20 mg 1 in am and then take 1/2 to 1 in afternoon as directed for 60 tablets. Pt said he would like to discuss with Dr. Fabian Sharp before changing. Told pt okay will transfer to scheduling for an appt. Pt transferred to schedule an appt on Monday.

## 2013-01-18 NOTE — Telephone Encounter (Signed)
This will not be the same   Release and length  Of efficacy.  But we can rx   And try switch.   adderall 20 mg  Disp 60 #  1 po q  am then take 1/2 to 1  In  afternoon as directed    [name] after a month of change

## 2013-01-21 ENCOUNTER — Ambulatory Visit (INDEPENDENT_AMBULATORY_CARE_PROVIDER_SITE_OTHER): Payer: BC Managed Care – PPO | Admitting: Internal Medicine

## 2013-01-21 ENCOUNTER — Encounter: Payer: Self-pay | Admitting: Internal Medicine

## 2013-01-21 VITALS — BP 130/94 | HR 90 | Temp 98.8°F | Wt 168.0 lb

## 2013-01-21 DIAGNOSIS — F988 Other specified behavioral and emotional disorders with onset usually occurring in childhood and adolescence: Secondary | ICD-10-CM

## 2013-01-21 DIAGNOSIS — Z79899 Other long term (current) drug therapy: Secondary | ICD-10-CM

## 2013-01-21 MED ORDER — AMPHETAMINE-DEXTROAMPHET ER 20 MG PO CP24: 40.0000 mg | ORAL_CAPSULE | ORAL | Status: DC

## 2013-01-21 NOTE — Patient Instructions (Signed)
Contact us  If pharmacy cannot fill the given medication.
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
## 2013-02-25 ENCOUNTER — Encounter: Payer: Self-pay | Admitting: Internal Medicine

## 2013-02-25 ENCOUNTER — Ambulatory Visit (INDEPENDENT_AMBULATORY_CARE_PROVIDER_SITE_OTHER): Payer: BC Managed Care – PPO | Admitting: Internal Medicine

## 2013-02-25 VITALS — HR 102 | Temp 98.7°F | Wt 165.0 lb

## 2013-02-25 DIAGNOSIS — L237 Allergic contact dermatitis due to plants, except food: Secondary | ICD-10-CM | POA: Insufficient documentation

## 2013-02-25 DIAGNOSIS — L255 Unspecified contact dermatitis due to plants, except food: Secondary | ICD-10-CM

## 2013-02-25 MED ORDER — FLUOCINONIDE-E 0.05 % EX CREA: TOPICAL_CREAM | Freq: Two times a day (BID) | CUTANEOUS | Status: DC

## 2013-02-25 MED ORDER — PREDNISONE 10 MG PO TABS: ORAL_TABLET | ORAL | Status: DC
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
## 2013-02-25 NOTE — Patient Instructions (Addendum)
Steroid treatment as instructed  Can use cream early   Rashes to help resolve .   Avoidance worsk the best  Long sleeves when working in trees and brush.    Poison [employer] ivy is a inflammation of the skin (contact dermatitis) caused by touching the allergens on the leaves of the ivy plant following previous exposure to the plant. The rash usually appears 48 hours after exposure. The rash is usually bumps (papules) or blisters (vesicles) in a linear pattern. Depending on your own sensitivity, the rash may simply cause redness and itching, or it may also progress to blisters which may break open. These must be well cared for to prevent secondary bacterial (germ) infection, followed by scarring. Keep any open areas dry, clean, dressed, and covered with an antibacterial ointment if needed. The eyes may also get puffy. The puffiness is worst in the morning and gets better as the day progresses. This dermatitis usually heals without scarring, within 2 to 3 weeks without treatment. HOME CARE INSTRUCTIONS  Thoroughly wash with soap and water as soon as you have been exposed to poison ivy. You have about one half hour to remove the plant resin before it will cause the rash. This washing will destroy the oil or antigen on the skin that is causing, or will cause, the rash. Be sure to wash under your fingernails as any plant resin there will continue to spread the rash. Do not rub skin vigorously when washing affected area. Poison ivy cannot spread if no oil from the plant remains on your body. A rash that has progressed to weeping sores will not spread the rash unless you have not washed thoroughly. It is also important to wash any clothes you have been wearing as these may carry active allergens. The rash will return if you wear the unwashed clothing, even several days later. Avoidance of the plant in the future is the best measure. Poison ivy plant can be recognized by the number of leaves. Generally, poison ivy  has three leaves with flowering branches on a single stem. Diphenhydramine may be purchased over the counter and used as needed for itching. Do not drive with this medication if it makes you drowsy.Ask your caregiver about medication for children. SEEK MEDICAL CARE IF:  Open sores develop.  Redness spreads beyond area of rash.  You notice purulent (pus-like) discharge.  You have increased pain.  Other signs of infection develop (such as fever). Document Released: 12/02/2000 Document Revised: 02/27/2012 Document Reviewed: 10/21/2009 [facility] Patient Information 2013 [place], [place].

## 2013-04-01 ENCOUNTER — Other Ambulatory Visit: Payer: Self-pay | Admitting: Internal Medicine

## 2013-04-09 ENCOUNTER — Telehealth: Payer: Self-pay | Admitting: Internal Medicine

## 2013-04-09 MED ORDER — AMPHETAMINE-DEXTROAMPHET ER 20 MG PO CP24: 40.0000 mg | ORAL_CAPSULE | ORAL | Status: DC

## 2013-04-09 NOTE — Telephone Encounter (Signed)
Patient called stating that he need a refill of his adderall 20 mg 2 poqd in the morning. Please assist.

## 2013-04-09 NOTE — Telephone Encounter (Signed)
Patient notified to pick up at the front desk. 

## 2013-05-01 ENCOUNTER — Telehealth: Payer: Self-pay | Admitting: Internal Medicine

## 2013-05-01 NOTE — Telephone Encounter (Signed)
Prior auth for patient to have ADDERALL XR 20mg  BID was denied. I sent all pertinent OV notes with as documentation. However, insurance states that MD must submit documentation in support of therapy with a higher dose for the intended dx, since they state that 20mg  once daily is the recommended dose. Additionally, this documentation must also include the length of time the requested dose has been uses, and what other drugs and doses have been tried & failed. I don't know if this is worth your time, or will be feasible. Does the med simply come in a 40mg  dose? Please advise.

## 2013-05-02 NOTE — Telephone Encounter (Signed)
Tell patient what is  Going on . I doubt if  Any letter will help   At this time  Find out  From his insurance  what meds  In the add category are covered and quantity allowed     and then have him make OV to figure out what  Is the best path .

## 2013-05-14 ENCOUNTER — Other Ambulatory Visit: Payer: Self-pay | Admitting: Internal Medicine

## 2013-05-14 ENCOUNTER — Other Ambulatory Visit (INDEPENDENT_AMBULATORY_CARE_PROVIDER_SITE_OTHER): Payer: BC Managed Care – PPO

## 2013-05-14 DIAGNOSIS — Z0000 Encounter for general adult medical examination without abnormal findings: Secondary | ICD-10-CM

## 2013-05-14 LAB — HEPATIC FUNCTION PANEL
ALT: 33 U/L (ref 0–53)
AST: 25 U/L (ref 0–37)
Albumin: 4.1 g/dL (ref 3.5–5.2)
Alkaline Phosphatase: 82 U/L (ref 39–117)
Bilirubin, Direct: 0.2 mg/dL (ref 0.0–0.3)
Total Bilirubin: 2 mg/dL — ABNORMAL HIGH (ref 0.3–1.2)
Total Protein: 7.2 g/dL (ref 6.0–8.3)

## 2013-05-14 LAB — CBC WITH DIFFERENTIAL/PLATELET
Basophils Relative: 0.6 % (ref 0.0–3.0)
Eosinophils Relative: 1.7 % (ref 0.0–5.0)
HCT: 46.1 % (ref 39.0–52.0)
Hemoglobin: 16 g/dL (ref 13.0–17.0)
Lymphs Abs: 1.6 10*3/uL (ref 0.7–4.0)
MCV: 87.7 fl (ref 78.0–100.0)
Monocytes Absolute: 1.1 10*3/uL — ABNORMAL HIGH (ref 0.1–1.0)
RBC: 5.25 Mil/uL (ref 4.22–5.81)
WBC: 6.7 10*3/uL (ref 4.5–10.5)

## 2013-05-14 LAB — LIPID PANEL
Cholesterol: 205 mg/dL — ABNORMAL HIGH (ref 0–200)
HDL: 46.3 mg/dL (ref >39.00)

## 2013-05-14 LAB — BASIC METABOLIC PANEL
Calcium: 9.1 mg/dL (ref 8.4–10.5)
GFR: 96.11 mL/min (ref >60.00)
Glucose, Bld: 80 mg/dL (ref 70–99)
Sodium: 139 mEq/L (ref 135–145)

## 2013-05-17 ENCOUNTER — Ambulatory Visit (INDEPENDENT_AMBULATORY_CARE_PROVIDER_SITE_OTHER): Payer: BC Managed Care – PPO | Admitting: Internal Medicine

## 2013-05-17 ENCOUNTER — Encounter: Payer: Self-pay | Admitting: Internal Medicine

## 2013-05-17 VITALS — BP 118/84 | HR 88 | Temp 98.2°F | Wt 165.0 lb

## 2013-05-17 DIAGNOSIS — I10 Essential (primary) hypertension: Secondary | ICD-10-CM

## 2013-05-17 DIAGNOSIS — H9319 Tinnitus, unspecified ear: Secondary | ICD-10-CM | POA: Insufficient documentation

## 2013-05-17 DIAGNOSIS — D869 Sarcoidosis, unspecified: Secondary | ICD-10-CM

## 2013-05-17 DIAGNOSIS — H9313 Tinnitus, bilateral: Secondary | ICD-10-CM

## 2013-05-17 DIAGNOSIS — F988 Other specified behavioral and emotional disorders with onset usually occurring in childhood and adolescence: Secondary | ICD-10-CM

## 2013-05-17 MED ORDER — AMPHETAMINE-DEXTROAMPHET ER 30 MG PO CP24: 30.0000 mg | ORAL_CAPSULE | ORAL | Status: DC

## 2013-05-17 NOTE — Progress Notes (Signed)
Chief Complaint  Patient presents with  . Tinnitus    In both ears worse in the left.  Started 1 month ago.    HPI: Patient comes in today for SDA for  new problem evaluation. Insidious onset  Of ringing in the ears  For the past month    Present laying in bed   Very high  Pitched.   More  Left than right .  / Weird pains in forearms and lower legs.  Related to activity .  Distal extremities  Positional .   Not related to above no history of dizziness or family history of significant air loss. No hearing aids. No history of very loud noises no pulsatile tinnitus  bp readings are mostly better. 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   With milk elimination   .   ADD prior authorization denied on 40 mg of Adderall a day. Discussed this with the patient he states that when he is in a maximum work mode 40 mg Works the best.   20 mg he is in mild to help but he doesn't accomplish his tasks as well.  He may get adequate help for non-work days with the 20 but notices a big difference on work days.  ROS: See pertinent positives and negatives per HPI. Cough with dairy products  doeswnt think from sarcoid.  Recent mouth sores with some foods wonders if the medications is causing chemical imbalance. No fevers syncope fasciculations. Past Medical History  Diagnosis Date  . Migraine   . Sarcoidosis     bronchoscopy 2006, mtx started 04/19/10 try off 11/29/10 (atypical cp/ ? worsening ct chest wlh) off prednisone 7/11  . Refusal of blood transfusions as patient is Jehovah's Witness   . Nephrolithiasis     hx of episode 1/10 calcium stones  . ADD (attention deficit disorder with hyperactivity)     formal evaluation  . OSA (obstructive sleep apnea)     mild,  Apnealink 7 2011 RDI7, O2 nadir 83%  . 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 MIGRAINE HEADACHE 08/30/2007    Qualifier: Diagnosis of  By: Tawanna Cooler RN, Alvino Chapel      Family History  Problem Relation Age of Onset  . Stroke      Grandfather father older age    History   Social History  . Marital Status: Married    Spouse Name: N/A    Number of Children: N/A  . Years of Education: N/A   Social History Main Topics  . Smoking status: Never Smoker   . Smokeless tobacco: None  . Alcohol Use:   . Drug Use:   . Sexually Active:    Other Topics Concern  . 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   Had to move ;last year and sell house because of financial difficulties in business.   Better now.    M in law died last year was in [facility] and wife caretaker.    Family in [place]   No tobacco or ETS    Outpatient Encounter Prescriptions as  of 05/17/2013  Medication Sig Dispense Refill  . fluocinonide-emollient (LIDEX-E) 0.05 % cream Apply topically 2 (two) times daily. If needed for poison ivy (Not on face. )  30 g  0  . losartan-hydrochlorothiazide (HYZAAR) 50-12.5 MG per tablet TAKE 1 TABLET BY MOUTH DAILY.  90 tablet  1  . [DISCONTINUED] amphetamine-dextroamphetamine (ADDERALL XR) 20 MG 24 hr capsule Take 2 capsules (40 mg total) by mouth every morning.  60 capsule  0  . amphetamine-dextroamphetamine (ADDERALL XR) 30 MG 24 hr capsule Take 1 capsule (30 mg total) by mouth every morning.  30 capsule  0  . [DISCONTINUED] predniSONE (DELTASONE) 10 MG tablet Take pills per day,6,6,6,4,4,4,2,2,2,1,1,1 for poison ivy  40 tablet  0   No facility-administered encounter medications on file as of 05/17/2013.    EXAM:  BP 118/84  Pulse 88  Temp(Src) 98.2 F (36.8 C) (Oral)  Wt 165 lb (74.844 kg)  BMI 25.45 kg/m2  SpO2 98%  Body mass index is 25.45 kg/(m^2).  GENERAL: vitals reviewed and listed above, alert,  oriented, appears well hydrated and in no acute distress  HEENT: atraumatic, conjunctiva  clear, no obvious abnormalities on inspection of external nose and ears TMs are clear and intact EOMs are full hearing screen at 40 dB is normal OP : no lesion edema or exudate tongue is midline  NECK: no obvious masses on inspection palpation no bruit masses or adenopathy  LUNGS: clear to auscultation bilaterally, no wheezes, rales or rhonchi, good air movement  CV: HRRR, no gallops or murmurs no clubbing cyanosis or  peripheral edema nl cap refill   MS: moves all extremities without noticeable focal  abnormality Neurologic appears intact normal gait negative Romberg no tremors no focal motor weakness. PSYCH: pleasant and cooperative, no obvious depression or anxiety  ASSESSMENT AND PLAN:  Discussed the following assessment and plan:  Tinnitus, bilateral - nl exam new onset  uncertain cause  - Plan: Ambulatory referral to ENT  Hypertension - better readings.   ADD  Sarcoidosis - pt thinks sx are from dairy sensitiviy and not any sarcoid or lung disease Cannot find evaluation in thei EHR   imagins studies cant find report.   Reviewed his CPX labs with him briefly potassium is borderline low increase potassium rich foods he asks about stopping blood pressure medicine altogether as he thinks it might be related to stress but at this time would maintain on same regimen unless he has untoward effect at some point we can consider backing off on the medication.  In the short run we will do a trial 30 mg X. are and have him come back for his preventive visit in a few weeks to assess response -Patient advised to return or notify health care team  if symptoms worsen or persist or new concerns arise.  Patient Instructions  Uncertain cause of the ringing in your ears it can be a common problem sometimes we don't find out the reason for it.  At this point her hearing is generally okay. Some medications can  do this but I don't think you're on one of them. Plan on getting an ear nose and throat specialist to evaluate these symptoms. To see if there interventions. Fortunately your exam is good today. At this time because her blood pressure is normal would not change medication yet becomes low and your dizzy such as 100 systolic rarely hold the medicine and contactoffice. Prior authorization for 40 mg of Adderall was denied. By your insurance. At  this time we can try Adderall XR 30 mg a day and if not getting response that works as well will contact the insurance about going back up to 40 mg.  labs showed borderline low potassium probably from the diuretic part of the blood pressure medication increase potassium rich foods to not get dehydrated we'll reevaluate at your check up. Tinnitus Sounds you hear in your ears and coming from within the ear is called tinnitus. This can be a symptom of many ear disorders. It is often associated with hearing loss.  Tinnitus can be seen with:  Infections.  Ear blockages such as wax buildup.  Meniere's disease.  Ear damage.  Inherited.  Occupational causes. While irritating, it is not usually a threat to health. When the cause of the tinnitus is wax, infection in the middle ear, or foreign body it is easily treated. Hearing loss will usually be reversible.  TREATMENT  When treating the underlying cause does not get rid of tinnitus, it may be necessary to get rid of the unwanted sound by covering it up with more pleasant background noises. This may include music, the radio etc. There are tinnitus maskers which can be worn which produce background noise to cover up the tinnitus. Avoid all medications which tend to make tinnitus worse such as alcohol, caffeine, aspirin, and nicotine. There are many soothing background tapes such as rain, ocean, thunderstorms, etc. These soothing sounds help with sleeping or resting. Keep all follow-up appointments and referrals.  This is important to identify the cause of the problem. It also helps avoid complications, impaired hearing, disability, or chronic pain. Document Released: 12/05/2005 Document Revised: 02/27/2012 Document Reviewed: 07/23/2008 [facility] Patient Information 2014 [place], [place].      Neta Mends. Panosh M.D.   Lab Results  Component Value Date   WBC 6.7 05/14/2013   HGB 16.0 05/14/2013   HCT 46.1 05/14/2013   PLT 320.0 05/14/2013   GLUCOSE 80 05/14/2013   CHOL 205* 05/14/2013   TRIG 115.0 05/14/2013   HDL 46.30 05/14/2013   LDLDIRECT 142.6 05/14/2013   LDLCALC 134* 05/18/2012   ALT 33 05/14/2013   AST 25 05/14/2013   NA 139 05/14/2013   K 3.3* 05/14/2013   CL 102 05/14/2013   CREATININE 0.9 05/14/2013   BUN 17 05/14/2013   CO2 28 05/14/2013   TSH 1.06 05/14/2013   INR 1.04 05/07/2010

## 2013-05-17 NOTE — Patient Instructions (Addendum)
Uncertain cause of the ringing in your ears it can be a common problem sometimes we don't find out the reason for it.  At this point her hearing is generally okay. Some medications can do this but I don't think you're on one of them. Plan on getting an ear nose and throat specialist to evaluate these symptoms. To see if there interventions. Fortunately your exam is good today. At this time because her blood pressure is normal would not change medication yet becomes low and your dizzy such as 100 systolic rarely hold the medicine and contactoffice. Prior authorization for 40 mg of Adderall was denied. By your insurance. At this time we can try Adderall XR 30 mg a day and if not getting response that works as well will contact the insurance about going back up to 40 mg.  labs showed borderline low potassium probably from the diuretic part of the blood pressure medication increase potassium rich foods to not get dehydrated we'll reevaluate at your check up. Tinnitus Sounds you hear in your ears and coming from within the ear is called tinnitus. This can be a symptom of many ear disorders. It is often associated with hearing loss.  Tinnitus can be seen with:  Infections.  Ear blockages such as wax buildup.  Meniere's disease.  Ear damage.  Inherited.  Occupational causes. While irritating, it is not usually a threat to health. When the cause of the tinnitus is wax, infection in the middle ear, or foreign body it is easily treated. Hearing loss will usually be reversible.  TREATMENT  When treating the underlying cause does not get rid of tinnitus, it may be necessary to get rid of the unwanted sound by covering it up with more pleasant background noises. This may include music, the radio etc. There are tinnitus maskers which can be worn which produce background noise to cover up the tinnitus. Avoid all medications which tend to make tinnitus worse such as alcohol, caffeine, aspirin, and  nicotine. There are many soothing background tapes such as rain, ocean, thunderstorms, etc. These soothing sounds help with sleeping or resting. Keep all follow-up appointments and referrals. This is important to identify the cause of the problem. It also helps avoid complications, impaired hearing, disability, or chronic pain. Document Released: 12/05/2005 Document Revised: 02/27/2012 Document Reviewed: 07/23/2008 [facility] Patient Information 2014 [place], [place].

## 2013-05-20 ENCOUNTER — Other Ambulatory Visit: Payer: BC Managed Care – PPO

## 2013-05-27 ENCOUNTER — Encounter: Payer: Self-pay | Admitting: Internal Medicine

## 2013-05-27 ENCOUNTER — Ambulatory Visit (INDEPENDENT_AMBULATORY_CARE_PROVIDER_SITE_OTHER): Payer: BC Managed Care – PPO | Admitting: Internal Medicine

## 2013-05-27 VITALS — BP 136/94 | HR 79 | Temp 98.6°F | Ht 67.75 in | Wt 164.0 lb

## 2013-05-27 DIAGNOSIS — H9319 Tinnitus, unspecified ear: Secondary | ICD-10-CM

## 2013-05-27 DIAGNOSIS — I10 Essential (primary) hypertension: Secondary | ICD-10-CM

## 2013-05-27 DIAGNOSIS — Z0000 Encounter for general adult medical examination without abnormal findings: Secondary | ICD-10-CM

## 2013-05-27 DIAGNOSIS — F988 Other specified behavioral and emotional disorders with onset usually occurring in childhood and adolescence: Secondary | ICD-10-CM

## 2013-05-27 DIAGNOSIS — H9313 Tinnitus, bilateral: Secondary | ICD-10-CM

## 2013-05-27 DIAGNOSIS — E876 Hypokalemia: Secondary | ICD-10-CM

## 2013-05-27 NOTE — Patient Instructions (Addendum)
Continue lifestyle intervention healthy eating and exercise . Need colon cancer screening  .  Last chest x ray was 2011 and consider update   X ray . If coughing is a problem then see  Dr Craige Cotta .     We will do a referral for colonoscopy  in the winter.  Chest x ray.   Keep monitoring .   bp readings   Increase potassium rich foods to raise the potassium level  .  Check BMP and magnesium in 3-4 weeks ( no ov needed if doing ok)      Preventive Care for Adults, Male A healthy lifestyle and preventive care can promote health and wellness. Preventive health guidelines for men include the following key practices:  A routine yearly physical is a good way to check with your caregiver about your health and preventative screening. It is a chance to share any concerns and updates on your health, and to receive a thorough exam.  Visit your dentist for a routine exam and preventative care every 6 months. Brush your teeth twice a day and floss once a day. Good oral hygiene prevents tooth decay and gum disease.  The frequency of eye exams is based on your age, health, family medical history, use of contact lenses, and other factors. Follow your caregiver's recommendations for frequency of eye exams.  Eat a healthy diet. Foods like vegetables, fruits, whole grains, low-fat dairy products, and lean protein foods contain the nutrients you need without too many calories. Decrease your intake of foods high in solid fats, added sugars, and salt. Eat the right amount of calories for you.Get information about a proper diet from your caregiver, if necessary.  Regular physical exercise is one of the most important things you can do for your health. Most adults should get at least 150 minutes of moderate-intensity exercise (any activity that increases your heart rate and causes you to sweat) each week. In addition, most adults need muscle-strengthening exercises on 2 or more days a week.  Maintain a healthy  weight. The body mass index (BMI) is a screening tool to identify possible weight problems. It provides an estimate of body fat based on height and weight. Your caregiver can help determine your BMI, and can help you achieve or maintain a healthy weight.For adults 20 years and older:  A BMI below 18.5 is considered underweight.  A BMI of 18.5 to 24.9 is normal.  A BMI of 25 to 29.9 is considered overweight.  A BMI of 30 and above is considered obese.  Maintain normal blood lipids and cholesterol levels by exercising and minimizing your intake of saturated fat. Eat a balanced diet with plenty of fruit and vegetables. Blood tests for lipids and cholesterol should begin at age 64 and be repeated every 5 years. If your lipid or cholesterol levels are high, you are over 50, or you are a high risk for heart disease, you may need your cholesterol levels checked more frequently.Ongoing high lipid and cholesterol levels should be treated with medicines if diet and exercise are not effective.  If you smoke, find out from your caregiver how to quit. If you do not use tobacco, do not start.  If you choose to drink alcohol, do not exceed 2 drinks per day. One drink is considered to be 12 ounces (355 mL) of beer, 5 ounces (148 mL) of wine, or 1.5 ounces (44 mL) of liquor.  Avoid use of street drugs. Do not share needles with anyone. Ask  for help if you need support or instructions about stopping the use of drugs.  High blood pressure causes heart disease and increases the risk of stroke. Your blood pressure should be checked at least every 1 to 2 years. Ongoing high blood pressure should be treated with medicines, if weight loss and exercise are not effective.  If you are 40 to 50 years old, ask your caregiver if you should take aspirin to prevent heart disease.  Diabetes screening involves taking a blood sample to check your fasting blood sugar level. This should be done once every 3 years, after age 28,  if you are within normal weight and without risk factors for diabetes. Testing should be considered at a younger age or be carried out more frequently if you are overweight and have at least 1 risk factor for diabetes.  Colorectal cancer can be detected and often prevented. Most routine colorectal cancer screening begins at the age of 58 and continues through age 15. However, your caregiver may recommend screening at an earlier age if you have risk factors for colon cancer. On a yearly basis, your caregiver may provide home test kits to check for hidden blood in the stool. Use of a small camera at the end of a tube, to directly examine the colon (sigmoidoscopy or colonoscopy), can detect the earliest forms of colorectal cancer. Talk to your caregiver about this at age 48, when routine screening begins. Direct examination of the colon should be repeated every 5 to 10 years through age 47, unless early forms of pre-cancerous polyps or small growths are found.  Hepatitis C blood testing is recommended for all people born from [birth year] through 1965 and any individual with known risks for hepatitis C.  Practice safe sex. Use condoms and avoid high-risk sexual practices to reduce the spread of sexually transmitted infections (STIs). STIs include gonorrhea, chlamydia, syphilis, trichomonas, herpes, HPV, and human immunodeficiency virus (HIV). Herpes, HIV, and HPV are viral illnesses that have no cure. They can result in disability, cancer, and death.  A one-time screening for abdominal aortic aneurysm (AAA) and surgical repair of large AAAs by sound wave imaging (ultrasonography) is recommended for ages 64 to 21 years who are current or former smokers.  Healthy men should no longer receive prostate-specific antigen (PSA) blood tests as part of routine cancer screening. Consult with your caregiver about prostate cancer screening.  Testicular cancer screening is not recommended for adult males who have no symptoms.  Screening includes self-exam, caregiver exam, and other screening tests. Consult with your caregiver about any symptoms you have or any concerns you have about testicular cancer.  Use sunscreen with skin protection factor (SPF) of 30 or more. Apply sunscreen liberally and repeatedly throughout the day. You should seek shade when your shadow is shorter than you. Protect yourself by wearing long sleeves, pants, a wide-brimmed hat, and sunglasses year round, whenever you are outdoors.  Once a month, do a whole body skin exam, using a mirror to look at the skin on your back. Notify your caregiver of new moles, moles that have irregular borders, moles that are larger than a pencil eraser, or moles that have changed in shape or color.  Stay current with required immunizations.  Influenza. You need a dose every fall (or winter). The composition of the flu vaccine changes each year, so being vaccinated once is not enough.  Pneumococcal polysaccharide. You need 1 to 2 doses if you smoke cigarettes or if you have certain chronic medical  conditions. You need 1 dose at age 63 (or older) if you have never been vaccinated.  Tetanus, diphtheria, pertussis (Tdap, Td). Get 1 dose of Tdap vaccine if you are younger than age 42 years, are over 45 and have contact with an infant, are a Research scientist (physical sciences), or simply want to be protected from whooping cough. After that, you need a Td booster dose every 10 years. Consult your caregiver if you have not had at least 3 tetanus and diphtheria-containing shots sometime in your life or have a deep or dirty wound.  HPV. This vaccine is recommended for males 13 through 50 years of age. This vaccine may be given to men 22 through 50 years of age who have not completed the 3 dose series. It is recommended for men through age 12 who have sex with men or whose immune system is weakened because of HIV infection, other illness, or medications. The vaccine is given in 3 doses over 6  months.  Measles, mumps, rubella (MMR). You need at least 1 dose of MMR if you were born in 1957 or later. You may also need a 2nd dose.  Meningococcal. If you are age 1 to 42 years and a Orthoptist living in a residence hall, or have one of several medical conditions, you need to get vaccinated against meningococcal disease. You may also need additional booster doses.  Zoster (shingles). If you are age 99 years or older, you should get this vaccine.  Varicella (chickenpox). If you have never had chickenpox or you were vaccinated but received only 1 dose, talk to your caregiver to find out if you need this vaccine.  Hepatitis A. You need this vaccine if you have a specific risk factor for hepatitis A virus infection, or you simply wish to be protected from this disease. The vaccine is usually given as 2 doses, 6 to 18 months apart.  Hepatitis B. You need this vaccine if you have a specific risk factor for hepatitis B virus infection or you simply wish to be protected from this disease. The vaccine is given in 3 doses, usually over 6 months. Preventative Service / Frequency Ages 51 to 77  Blood pressure check.** / Every 1 to 2 years.  Lipid and cholesterol check.** / Every 5 years beginning at age 43.  Hepatitis C blood test.** / For any individual with known risks for hepatitis C.  Skin self-exam. / Monthly.  Influenza immunization.** / Every year.  Pneumococcal polysaccharide immunization.** / 1 to 2 doses if you smoke cigarettes or if you have certain chronic medical conditions.  Tetanus, diphtheria, pertussis (Tdap,Td) immunization. / A one-time dose of Tdap vaccine. After that, you need a Td booster dose every 10 years.  HPV immunization. / 3 doses over 6 months, if 26 and younger.  Measles, mumps, rubella (MMR) immunization. / You need at least 1 dose of MMR if you were born in 1957 or later. You may also need a 2nd dose.  Meningococcal immunization. / 1 dose  if you are age 55 to 36 years and a Orthoptist living in a residence hall, or have one of several medical conditions, you need to get vaccinated against meningococcal disease. You may also need additional booster doses.  Varicella immunization.** / Consult your caregiver.  Hepatitis A immunization.** / Consult your caregiver. 2 doses, 6 to 18 months apart.  Hepatitis B immunization.** / Consult your caregiver. 3 doses usually over 6 months. Ages 17 to 54  Blood  pressure check.** / Every 1 to 2 years.  Lipid and cholesterol check.** / Every 5 years beginning at age 13.  Fecal occult blood test (FOBT) of stool. / Every year beginning at age 30 and continuing until age 35. You may not have to do this test if you get colonoscopy every 10 years.  Flexible sigmoidoscopy** or colonoscopy.** / Every 5 years for a flexible sigmoidoscopy or every 10 years for a colonoscopy beginning at age 43 and continuing until age 20.  Hepatitis C blood test.** / For all people born from [birth year] through 1963 and any individual with known risks for hepatitis C.  Skin self-exam. / Monthly.  Influenza immunization.** / Every year.  Pneumococcal polysaccharide immunization.** / 1 to 2 doses if you smoke cigarettes or if you have certain chronic medical conditions.  Tetanus, diphtheria, pertussis (Tdap/Td) immunization.** / A one-time dose of Tdap vaccine. After that, you need a Td booster dose every 10 years.  Measles, mumps, rubella (MMR) immunization. / You need at least 1 dose of MMR if you were born in 1957 or later. You may also need a 2nd dose.  Varicella immunization.**/ Consult your caregiver.  Meningococcal immunization.** / Consult your caregiver.  Hepatitis A immunization.** / Consult your caregiver. 2 doses, 6 to 18 months apart.  Hepatitis B immunization.** / Consult your caregiver. 3 doses, usually over 6 months. Ages 60 and over  Blood pressure check.** / Every 1 to 2  years.  Lipid and cholesterol check.**/ Every 5 years beginning at age 80.  Fecal occult blood test (FOBT) of stool. / Every year beginning at age 38 and continuing until age 54. You may not have to do this test if you get colonoscopy every 10 years.  Flexible sigmoidoscopy** or colonoscopy.** / Every 5 years for a flexible sigmoidoscopy or every 10 years for a colonoscopy beginning at age 53 and continuing until age 13.  Hepatitis C blood test.** / For all people born from [birth year] through 1963 and any individual with known risks for hepatitis C.  Abdominal aortic aneurysm (AAA) screening.** / A one-time screening for ages 62 to 51 years who are current or former smokers.  Skin self-exam. / Monthly.  Influenza immunization.** / Every year.  Pneumococcal polysaccharide immunization.** / 1 dose at age 36 (or older) if you have never been vaccinated.  Tetanus, diphtheria, pertussis (Tdap, Td) immunization. / A one-time dose of Tdap vaccine if you are over 65 and have contact with an infant, are a Research scientist (physical sciences), or simply want to be protected from whooping cough. After that, you need a Td booster dose every 10 years.  Varicella immunization. ** / Consult your caregiver.  Meningococcal immunization.** / Consult your caregiver.  Hepatitis A immunization. ** / Consult your caregiver. 2 doses, 6 to 18 months apart.  Hepatitis B immunization.** / Check with your caregiver. 3 doses, usually over 6 months. **Family history and personal history of risk and conditions may change your caregiver's recommendations. Document Released: 01/31/2002 Document Revised: 02/27/2012 Document Reviewed: 05/02/2011 [facility] Patient Information 2014 [place], [place].

## 2013-05-27 NOTE — Progress Notes (Signed)
Chief Complaint  Patient presents with  . Annual Exam    bp add etc     HPI: Patient comes in today for Preventive Health Care visit  And fu adhd med and tinnitus . 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  He brought his blood pressure machine and and the recent reading this morning was 122/84. Other readings are generally controlled below 130/90 with an occasional diastolic elevation.  He started taking half a pill of his blood pressure medicine thinking that it could help his intermittent extremity pain and is better about 25%. He denies any specific muscle cramps.  No chest pain shortness of breath gets occasional cough and throat clearing that he relates to milk. His last pulmonary check was a few years ago.  ADD the Adderall 30 seems to work okay on work days however he would prefer to take less on weekends such as 20 mg. ROS:  GEN/ HEENT: No fever, significant weight changes sweats headaches vision problems hearing changes, CV/ PULM; No chest pain shortness of breath  syncope,edema  change in exercise tolerance. GI /GU: No adominal pain, vomiting, change in bowel habits. No blood in the stool. No significant GU symptoms. SKIN/HEME: ,no acute skin rashes suspicious lesions or bleeding. No lymphadenopathy, nodules, masses.  NEURO/ PSYCH:  No neurologic signs such as weakness numbness. No depression anxiety. IMM/ Allergy: No unusual infections.  Allergy .   REST of 12 system review negative except as per HPI   Past Medical History  Diagnosis Date  . Migraine   . Sarcoidosis     bronchoscopy 2006, mtx started 04/19/10 try off 11/29/10 (atypical cp/ ? worsening ct chest [facility]) off prednisone 7/11  . Refusal of blood transfusions as patient is Jehovah's Witness   . Nephrolithiasis     hx of episode 1/10 calcium stones  . ADD (attention deficit disorder with  hyperactivity)     formal evaluation  . OSA (obstructive sleep apnea)     mild, Apnealink 7 2011 RDI7, O2 nadir 83%  . 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 MIGRAINE HEADACHE 08/30/2007    Qualifier: Diagnosis of  By: Tawanna Cooler RN, Alvino Chapel      Family History  Problem Relation Age of Onset  . Stroke      Grandfather father older age    History   Social History  . Marital Status: Married    Spouse Name: N/A    Number of Children: N/A  . Years of Education: N/A   Social History Main Topics  . Smoking status: Never Smoker   . Smokeless tobacco: None  . Alcohol Use:   . Drug Use:   . Sexually Active:    Other Topics Concern  . 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   Had to move ;last year and sell house because of financial difficulties in business.   Better now.    M in law died last year was in [facility] and wife caretaker.    Family in [place]   No tobacco or ETS    Outpatient Encounter Prescriptions as of 05/27/2013  Medication Sig Dispense Refill  . amphetamine-dextroamphetamine (ADDERALL XR) 30 MG 24 hr capsule Take 1 capsule (30 mg total) by mouth every  morning.  30 capsule  0  . losartan-hydrochlorothiazide (HYZAAR) 50-12.5 MG per tablet TAKE 1 TABLET BY MOUTH DAILY.  90 tablet  1  . [DISCONTINUED] fluocinonide-emollient (LIDEX-E) 0.05 % cream Apply topically 2 (two) times daily. If needed for poison ivy (Not on face. )  30 g  0   No facility-administered encounter medications on file as of 05/27/2013.    EXAM:  BP 136/94  Pulse 79  Temp(Src) 98.6 F (37 C) (Oral)  Ht 5' 7.75" (1.721 m)  Wt 164 lb (74.39 kg)  BMI 25.12 kg/m2  SpO2 97%  Body mass index is 25.12 kg/(m^2).  Physical Exam: Vital signs reviewed [id number] is a well-developed well-nourished alert cooperative   male who appears  stated age in no acute distress.  HEENT: normocephalic atraumatic , Eyes: PERRL EOM's full,  conjunctiva clear, Nares: paten,t no deformity discharge or tenderness., Ears: no deformity EAC's clear TMs with normal landmarks. Mouth: clear OP, no lesions, edema.  Moist mucous membranes. Dentition in adequate repair. NECK: supple without masses, thyromegaly or bruits. CHEST/PULM:  Clear to auscultation and percussion breath sounds equal no wheeze , rales or rhonchi. No chest wall deformities or tenderness. CV: PMI is nondisplaced, S1 S2 no gallops, murmurs, rubs. Peripheral pulses are full without delay.No JVD .  ABDOMEN: Bowel sounds normal nontender  No guard or rebound, no hepato splenomegal no CVA tenderness.  No hernia. Extremtities:  No clubbing cyanosis or edema, no acute joint swelling or redness no focal atrophy NEURO:  Oriented x3, cranial nerves 3-12 appear to be intact, no obvious focal weakness,gait within normal limits no abnormal reflexes or asymmetrical SKIN: No acute rashes normal turgor, color, no bruising or petechiae. PSYCH: Oriented, good eye contact, no obvious depression anxiety, cognition and judgment appear normal. LN: no cervical axillary inguinal adenopathy  Lab Results  Component Value Date   WBC 6.7 05/14/2013   HGB 16.0 05/14/2013   HCT 46.1 05/14/2013   PLT 320.0 05/14/2013   GLUCOSE 80 05/14/2013   CHOL 205* 05/14/2013   TRIG 115.0 05/14/2013   HDL 46.30 05/14/2013   LDLDIRECT 142.6 05/14/2013   LDLCALC 134* 05/18/2012   ALT 33 05/14/2013   AST 25 05/14/2013   NA 139 05/14/2013   K 3.3* 05/14/2013   CL 102 05/14/2013   CREATININE 0.9 05/14/2013   BUN 17 05/14/2013   CO2 28 05/14/2013   TSH 1.06 05/14/2013   INR 1.04 05/07/2010   127/92   Pt cuff left   Second    132/96   my reading 136/94    ASSESSMENT AND PLAN:  Discussed the following assessment and plan:  Encounter for preventive health examination - Due for colonoscopy this year like to wait to winter because of his work situation ;we'll do referral at next visit  ADD - For now continue on the 30 mg  XRT  Hypertension - Correlates machine with our readings  Tinnitus, bilateral  Hypokalemia - 3.3 uncertain if causing the symptom discussed options increase potassium in diet recheck with magnesium in 3-4 weeks if not better add supplement Uncertain if he will maintain control on half a dose of blood pressure medication he'll contact us if it is creeping up or not controlled next go back up to the whole pill and supplement with potassium. Patient Care Team: Madelin Headings, MD as PCP - General Coralyn Helling, MD (Pulmonary Disease) Patient Instructions  Continue lifestyle intervention healthy eating and exercise . Need colon cancer screening  .  Last  chest x ray was 2011 and consider update   X ray . If coughing is a problem then see  Dr Craige Cotta .     We will do a referral for colonoscopy  in the winter.  Chest x ray.   Keep monitoring .   bp readings   Increase potassium rich foods to raise the potassium level  .  Check BMP and magnesium in 3-4 weeks ( no ov needed if doing ok)      Preventive Care for Adults, Male A healthy lifestyle and preventive care can promote health and wellness. Preventive health guidelines for men include the following key practices:  A routine yearly physical is a good way to check with your caregiver about your health and preventative screening. It is a chance to share any concerns and updates on your health, and to receive a thorough exam.  Visit your dentist for a routine exam and preventative care every 6 months. Brush your teeth twice a day and floss once a day. Good oral hygiene prevents tooth decay and gum disease.  The frequency of eye exams is based on your age, health, family medical history, use of contact lenses, and other factors. Follow your caregiver's recommendations for frequency of eye exams.  Eat a healthy diet. Foods like vegetables, fruits, whole grains, low-fat dairy products, and lean protein foods contain the nutrients you need without  too many calories. Decrease your intake of foods high in solid fats, added sugars, and salt. Eat the right amount of calories for you.Get information about a proper diet from your caregiver, if necessary.  Regular physical exercise is one of the most important things you can do for your health. Most adults should get at least 150 minutes of moderate-intensity exercise (any activity that increases your heart rate and causes you to sweat) each week. In addition, most adults need muscle-strengthening exercises on 2 or more days a week.  Maintain a healthy weight. The body mass index (BMI) is a screening tool to identify possible weight problems. It provides an estimate of body fat based on height and weight. Your caregiver can help determine your BMI, and can help you achieve or maintain a healthy weight.For adults 20 years and older:  A BMI below 18.5 is considered underweight.  A BMI of 18.5 to 24.9 is normal.  A BMI of 25 to 29.9 is considered overweight.  A BMI of 30 and above is considered obese.  Maintain normal blood lipids and cholesterol levels by exercising and minimizing your intake of saturated fat. Eat a balanced diet with plenty of fruit and vegetables. Blood tests for lipids and cholesterol should begin at age 42 and be repeated every 5 years. If your lipid or cholesterol levels are high, you are over 50, or you are a high risk for heart disease, you may need your cholesterol levels checked more frequently.Ongoing high lipid and cholesterol levels should be treated with medicines if diet and exercise are not effective.  If you smoke, find out from your caregiver how to quit. If you do not use tobacco, do not start.  If you choose to drink alcohol, do not exceed 2 drinks per day. One drink is considered to be 12 ounces (355 mL) of beer, 5 ounces (148 mL) of wine, or 1.5 ounces (44 mL) of liquor.  Avoid use of street drugs. Do not share needles with anyone. Ask for help if you need  support or instructions about stopping the use of drugs.  High blood pressure causes heart disease and increases the risk of stroke. Your blood pressure should be checked at least every 1 to 2 years. Ongoing high blood pressure should be treated with medicines, if weight loss and exercise are not effective.  If you are 28 to 50 years old, ask your caregiver if you should take aspirin to prevent heart disease.  Diabetes screening involves taking a blood sample to check your fasting blood sugar level. This should be done once every 3 years, after age 69, if you are within normal weight and without risk factors for diabetes. Testing should be considered at a younger age or be carried out more frequently if you are overweight and have at least 1 risk factor for diabetes.  Colorectal cancer can be detected and often prevented. Most routine colorectal cancer screening begins at the age of 92 and continues through age 23. However, your caregiver may recommend screening at an earlier age if you have risk factors for colon cancer. On a yearly basis, your caregiver may provide home test kits to check for hidden blood in the stool. Use of a small camera at the end of a tube, to directly examine the colon (sigmoidoscopy or colonoscopy), can detect the earliest forms of colorectal cancer. Talk to your caregiver about this at age 48, when routine screening begins. Direct examination of the colon should be repeated every 5 to 10 years through age 94, unless early forms of pre-cancerous polyps or small growths are found.  Hepatitis C blood testing is recommended for all people born from [birth year] through 1965 and any individual with known risks for hepatitis C.  Practice safe sex. Use condoms and avoid high-risk sexual practices to reduce the spread of sexually transmitted infections (STIs). STIs include gonorrhea, chlamydia, syphilis, trichomonas, herpes, HPV, and human immunodeficiency virus (HIV). Herpes, HIV, and HPV  are viral illnesses that have no cure. They can result in disability, cancer, and death.  A one-time screening for abdominal aortic aneurysm (AAA) and surgical repair of large AAAs by sound wave imaging (ultrasonography) is recommended for ages 30 to 13 years who are current or former smokers.  Healthy men should no longer receive prostate-specific antigen (PSA) blood tests as part of routine cancer screening. Consult with your caregiver about prostate cancer screening.  Testicular cancer screening is not recommended for adult males who have no symptoms. Screening includes self-exam, caregiver exam, and other screening tests. Consult with your caregiver about any symptoms you have or any concerns you have about testicular cancer.  Use sunscreen with skin protection factor (SPF) of 30 or more. Apply sunscreen liberally and repeatedly throughout the day. You should seek shade when your shadow is shorter than you. Protect yourself by wearing long sleeves, pants, a wide-brimmed hat, and sunglasses year round, whenever you are outdoors.  Once a month, do a whole body skin exam, using a mirror to look at the skin on your back. Notify your caregiver of new moles, moles that have irregular borders, moles that are larger than a pencil eraser, or moles that have changed in shape or color.  Stay current with required immunizations.  Influenza. You need a dose every fall (or winter). The composition of the flu vaccine changes each year, so being vaccinated once is not enough.  Pneumococcal polysaccharide. You need 1 to 2 doses if you smoke cigarettes or if you have certain chronic medical conditions. You need 1 dose at age 41 (or older) if you have never been vaccinated.  Tetanus, diphtheria, pertussis (Tdap, Td). Get 1 dose of Tdap vaccine if you are younger than age 28 years, are over 47 and have contact with an infant, are a Research scientist (physical sciences), or simply want to be protected from whooping cough. After that,  you need a Td booster dose every 10 years. Consult your caregiver if you have not had at least 3 tetanus and diphtheria-containing shots sometime in your life or have a deep or dirty wound.  HPV. This vaccine is recommended for males 13 through 50 years of age. This vaccine may be given to men 22 through 50 years of age who have not completed the 3 dose series. It is recommended for men through age 81 who have sex with men or whose immune system is weakened because of HIV infection, other illness, or medications. The vaccine is given in 3 doses over 6 months.  Measles, mumps, rubella (MMR). You need at least 1 dose of MMR if you were born in 1957 or later. You may also need a 2nd dose.  Meningococcal. If you are age 68 to 28 years and a Orthoptist living in a residence hall, or have one of several medical conditions, you need to get vaccinated against meningococcal disease. You may also need additional booster doses.  Zoster (shingles). If you are age 57 years or older, you should get this vaccine.  Varicella (chickenpox). If you have never had chickenpox or you were vaccinated but received only 1 dose, talk to your caregiver to find out if you need this vaccine.  Hepatitis A. You need this vaccine if you have a specific risk factor for hepatitis A virus infection, or you simply wish to be protected from this disease. The vaccine is usually given as 2 doses, 6 to 18 months apart.  Hepatitis B. You need this vaccine if you have a specific risk factor for hepatitis B virus infection or you simply wish to be protected from this disease. The vaccine is given in 3 doses, usually over 6 months. Preventative Service / Frequency Ages 7 to 26  Blood pressure check.** / Every 1 to 2 years.  Lipid and cholesterol check.** / Every 5 years beginning at age 81.  Hepatitis C blood test.** / For any individual with known risks for hepatitis C.  Skin self-exam. / Monthly.  Influenza  immunization.** / Every year.  Pneumococcal polysaccharide immunization.** / 1 to 2 doses if you smoke cigarettes or if you have certain chronic medical conditions.  Tetanus, diphtheria, pertussis (Tdap,Td) immunization. / A one-time dose of Tdap vaccine. After that, you need a Td booster dose every 10 years.  HPV immunization. / 3 doses over 6 months, if 26 and younger.  Measles, mumps, rubella (MMR) immunization. / You need at least 1 dose of MMR if you were born in 1957 or later. You may also need a 2nd dose.  Meningococcal immunization. / 1 dose if you are age 50 to 57 years and a Orthoptist living in a residence hall, or have one of several medical conditions, you need to get vaccinated against meningococcal disease. You may also need additional booster doses.  Varicella immunization.** / Consult your caregiver.  Hepatitis A immunization.** / Consult your caregiver. 2 doses, 6 to 18 months apart.  Hepatitis B immunization.** / Consult your caregiver. 3 doses usually over 6 months. Ages 61 to 57  Blood pressure check.** / Every 1 to 2 years.  Lipid and cholesterol check.** / Every 5 years  beginning at age 21.  Fecal occult blood test (FOBT) of stool. / Every year beginning at age 35 and continuing until age 33. You may not have to do this test if you get colonoscopy every 10 years.  Flexible sigmoidoscopy** or colonoscopy.** / Every 5 years for a flexible sigmoidoscopy or every 10 years for a colonoscopy beginning at age 60 and continuing until age 65.  Hepatitis C blood test.** / For all people born from [birth year] through 1965 and any individual with known risks for hepatitis C.  Skin self-exam. / Monthly.  Influenza immunization.** / Every year.  Pneumococcal polysaccharide immunization.** / 1 to 2 doses if you smoke cigarettes or if you have certain chronic medical conditions.  Tetanus, diphtheria, pertussis (Tdap/Td) immunization.** / A one-time dose of Tdap  vaccine. After that, you need a Td booster dose every 10 years.  Measles, mumps, rubella (MMR) immunization. / You need at least 1 dose of MMR if you were born in 1957 or later. You may also need a 2nd dose.  Varicella immunization.**/ Consult your caregiver.  Meningococcal immunization.** / Consult your caregiver.  Hepatitis A immunization.** / Consult your caregiver. 2 doses, 6 to 18 months apart.  Hepatitis B immunization.** / Consult your caregiver. 3 doses, usually over 6 months. Ages 52 and over  Blood pressure check.** / Every 1 to 2 years.  Lipid and cholesterol check.**/ Every 5 years beginning at age 29.  Fecal occult blood test (FOBT) of stool. / Every year beginning at age 56 and continuing until age 89. You may not have to do this test if you get colonoscopy every 10 years.  Flexible sigmoidoscopy** or colonoscopy.** / Every 5 years for a flexible sigmoidoscopy or every 10 years for a colonoscopy beginning at age 2 and continuing until age 42.  Hepatitis C blood test.** / For all people born from [birth year] through 1965 and any individual with known risks for hepatitis C.  Abdominal aortic aneurysm (AAA) screening.** / A one-time screening for ages 89 to 40 years who are current or former smokers.  Skin self-exam. / Monthly.  Influenza immunization.** / Every year.  Pneumococcal polysaccharide immunization.** / 1 dose at age 61 (or older) if you have never been vaccinated.  Tetanus, diphtheria, pertussis (Tdap, Td) immunization. / A one-time dose of Tdap vaccine if you are over 65 and have contact with an infant, are a Research scientist (physical sciences), or simply want to be protected from whooping cough. After that, you need a Td booster dose every 10 years.  Varicella immunization. ** / Consult your caregiver.  Meningococcal immunization.** / Consult your caregiver.  Hepatitis A immunization. ** / Consult your caregiver. 2 doses, 6 to 18 months apart.  Hepatitis B immunization.** /  Check with your caregiver. 3 doses, usually over 6 months. **Family history and personal history of risk and conditions may change your caregiver's recommendations. Document Released: 01/31/2002 Document Revised: 02/27/2012 Document Reviewed: 05/02/2011 [facility] Patient Information 2014 [place], [place].    Potassium was slightly low last time  Increase potassium rich foods and if   Recheck bmpm in a few weeks if not better then may need to add supplement .,  Continue   Same adderall dose for now.  Neta Mends. Panosh M.D.  Health Maintenance  Topic Date Due  . Colonoscopy  03/21/2013  . Influenza Vaccine  08/19/2013  . Tetanus/tdap  09/06/2017   Health Maintenance Review

## 2013-06-14 ENCOUNTER — Other Ambulatory Visit (INDEPENDENT_AMBULATORY_CARE_PROVIDER_SITE_OTHER): Payer: BC Managed Care – PPO

## 2013-06-14 DIAGNOSIS — I10 Essential (primary) hypertension: Secondary | ICD-10-CM

## 2013-06-14 LAB — BASIC METABOLIC PANEL
BUN: 17 mg/dL (ref 6–23)
Calcium: 8.9 mg/dL (ref 8.4–10.5)
GFR: 86.99 mL/min (ref >60.00)
Glucose, Bld: 99 mg/dL (ref 70–99)
Sodium: 138 mEq/L (ref 135–145)

## 2013-06-18 ENCOUNTER — Telehealth: Payer: Self-pay | Admitting: Internal Medicine

## 2013-06-18 MED ORDER — AMPHETAMINE-DEXTROAMPHET ER 30 MG PO CP24: 30.0000 mg | ORAL_CAPSULE | ORAL | Status: DC

## 2013-06-18 NOTE — Telephone Encounter (Signed)
Left message on personally identified cell informing the pt to pick up rx at the front desk.

## 2013-06-18 NOTE — Telephone Encounter (Signed)
PT called to request a 3 month supply of his amphetamine-dextroamphetamine (ADDERALL XR) 30 MG 24 hr capsule. Please assist.  °

## 2013-09-13 ENCOUNTER — Telehealth: Payer: Self-pay | Admitting: Internal Medicine

## 2013-09-13 NOTE — Telephone Encounter (Signed)
Pt is calling to request a 3 month supply of his amphetamine-dextroamphetamine (ADDERALL XR) 30 MG 24 hr capsule. Please assist.

## 2013-09-16 ENCOUNTER — Other Ambulatory Visit: Payer: Self-pay | Admitting: Family Medicine

## 2013-09-16 MED ORDER — AMPHETAMINE-DEXTROAMPHET ER 30 MG PO CP24: 30.0000 mg | ORAL_CAPSULE | ORAL | Status: DC

## 2013-09-16 NOTE — Telephone Encounter (Signed)
Patient notified to pick up at the front desk. 

## 2013-09-27 ENCOUNTER — Other Ambulatory Visit: Payer: Self-pay | Admitting: Internal Medicine

## 2013-11-27 ENCOUNTER — Encounter: Payer: Self-pay | Admitting: *Deleted

## 2013-11-28 ENCOUNTER — Ambulatory Visit (INDEPENDENT_AMBULATORY_CARE_PROVIDER_SITE_OTHER): Payer: BC Managed Care – PPO | Admitting: Internal Medicine

## 2013-11-28 ENCOUNTER — Encounter: Payer: Self-pay | Admitting: Internal Medicine

## 2013-11-28 VITALS — BP 128/96 | HR 98 | Temp 98.0°F | Wt 166.0 lb

## 2013-11-28 DIAGNOSIS — D869 Sarcoidosis, unspecified: Secondary | ICD-10-CM

## 2013-11-28 DIAGNOSIS — Z1211 Encounter for screening for malignant neoplasm of colon: Secondary | ICD-10-CM

## 2013-11-28 DIAGNOSIS — Z79899 Other long term (current) drug therapy: Secondary | ICD-10-CM

## 2013-11-28 DIAGNOSIS — R059 Cough, unspecified: Secondary | ICD-10-CM

## 2013-11-28 DIAGNOSIS — I10 Essential (primary) hypertension: Secondary | ICD-10-CM

## 2013-11-28 DIAGNOSIS — R05 Cough: Secondary | ICD-10-CM

## 2013-11-28 DIAGNOSIS — F988 Other specified behavioral and emotional disorders with onset usually occurring in childhood and adolescence: Secondary | ICD-10-CM

## 2013-11-28 MED ORDER — AMPHETAMINE-DEXTROAMPHET ER 30 MG PO CP24: 30.0000 mg | ORAL_CAPSULE | ORAL | Status: DC

## 2013-11-28 MED ORDER — LOSARTAN POTASSIUM-HCTZ 100-12.5 MG PO TABS: 1.0000 | ORAL_TABLET | Freq: Every day | ORAL | Status: DC

## 2013-11-28 NOTE — Progress Notes (Signed)
Chief Complaint  Patient presents with  . Follow-up    HPI: Patient comes in today for follow up of  multiple medical problems.    ADHD  adderall  xr 30   Self adjust. Good enough. Takes it most days  BP  Usually 125 - 135/90 95 at home tends to go up when in the office  And is nervous ;is only taking a half a pill of losartan HCTZ 50/12.5. Believe he had some kind of leg pains and didn't feel right on the whole pill  Throat clearing no real reflux  Cough   With  Stress running around. Not physical exercise. Seems to have been a problem ever since he was younger he calls up for sarcoid but has no shortness of breath. He also states he gets these symptoms when he drinks milk or dairy products. He hasn't had a followup with pulmonary in a number of years but has been doing well.  ROS: See pertinent positives and negatives per HPI. No current chest pain shortness of breath less stress recently business is doing better.  No GI complaints due for colonoscopy has never had one. Able to mountain bike without shortness of breath or chest pain  Past Medical History  Diagnosis Date  . Migraine   . Sarcoidosis     bronchoscopy 2006, mtx started 04/19/10 try off 11/29/10 (atypical cp/ ? worsening ct chest wl[facility]) off prednisone 7/11  . Refusal of blood transfusions as patient is Jehovah's Witness   . Nephrolithiasis     hx of episode 1/10 calcium stones  . ADD (attention deficit disorder with hyperactivity)     formal evaluation  . OSA (obstructive sleep apnea)     mild, Apnealink 7 2011 RDI7, O2 nadir 83%  . 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 MIGRAINE HEADACHE 08/30/2007    Qualifier: Diagnosis of  By: Tawanna Cooler RN, Alvino Chapel      Family History  Problem Relation Age of Onset  . Stroke      Grandfather father older age    History   Social History  . Marital Status: Married    Spouse Name: N/A    Number of Children: N/A  . Years of Education: N/A   Social History Main  Topics  . Smoking status: Never Smoker   . Smokeless tobacco: None  . Alcohol Use:   . Drug Use:   . Sexual Activity:    Other Topics Concern  . 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   Had to move ;last year and sell house because of financial difficulties in business.   Better now.    M in law died last year was in [facility] and wife caretaker.    Family in [place]   No tobacco or ETS    Outpatient Encounter Prescriptions as of 11/28/2013  Medication Sig  . amphetamine-dextroamphetamine (ADDERALL XR) 30 MG 24 hr capsule Take 1 capsule (30 mg total) by mouth every morning.  [name] amphetamine-dextroamphetamine (ADDERALL XR) 30 MG 24 hr capsule Take 1 capsule (30 mg total) by mouth every morning.  [name] amphetamine-dextroamphetamine (ADDERALL XR) 30 MG 24 hr capsule Take 1 capsule (30 mg total) by mouth every morning.  . [DISCONTINUED] amphetamine-dextroamphetamine (ADDERALL XR) 30 MG 24 hr capsule Take 1 capsule (30 mg total) by mouth every morning.  . [DISCONTINUED] amphetamine-dextroamphetamine (ADDERALL  XR) 30 MG 24 hr capsule Take 1 capsule (30 mg total) by mouth every morning.  . [DISCONTINUED] amphetamine-dextroamphetamine (ADDERALL XR) 30 MG 24 hr capsule Take 1 capsule (30 mg total) by mouth every morning.  . [DISCONTINUED] losartan-hydrochlorothiazide (HYZAAR) 50-12.5 MG per tablet TAKE 1 TABLET BY MOUTH DAILY.  [name] losartan-hydrochlorothiazide (HYZAAR) 100-12.5 MG per tablet Take 1 tablet by mouth daily. As directed    EXAM:  BP 128/96  Pulse 98  Temp(Src) 98 F (36.7 C) (Oral)  Wt 166 lb (75.297 kg)  SpO2 99%  Body mass index is 25.42 kg/(m^2).  GENERAL: vitals reviewed and listed above, alert, oriented, appears well hydrated and in no acute distress his occasional throat clearing looks well repeat blood pressure lower but diastolic still elevated  HEENT: atraumatic, conjunctiva  clear, no obvious abnormalities on  inspection of external nose and ears  NECK: no obvious masses on inspection palpation  LUNGS: clear to auscultation bilaterally, no wheezes, rales or rhonchi, good air movement CV: HRRR, no clubbing cyanosis or  peripheral edema nl cap refill  Abdomen soft no hepatosplenomegaly MS: moves all extremities without noticeable focal  abnormality PSYCH: pleasant and cooperative, no obvious depression or anxiety  ASSESSMENT AND PLAN:  Discussed the following assessment and plan:  ADD - No change in medication had some questions about controlled substance contract reassured can use more than one pharmacy if informed   Hypertension - diastoiklic had se of higher dose ? if diuretic comp trial of 1/2 100/12.5  and fu in 3 months  Sarcoidosis - Quiescent by history the chest x-ray PFTs when convenient - Plan: DG Chest 2 View, Pulmonary function test  Medication management  Cough - Sounds like habit cough anxiety cough and/or other cause not related to lower respiratory disease. - Plan: DG Chest 2 View, Pulmonary function test  Colon cancer screening - Plan: Ambulatory referral to Gastroenterology -Patient advised to return or notify health care team  if symptoms worsen or persist or new concerns arise.  Patient Instructions  Goal of bp is below 140 /90  Reformulating  Medication.   Take 1/2 of new formulation Mediterranean  And dash diet helpful.  Sign up for my chart and send in bp readings in a month  Get a chest x ray and  Amy order pulmonary function tests about the sarcoid .  Monitoring .  Will refer for routine colonoscopy.  [name] in about 3 months to check bp  And medication change          Neta Mends. Panosh M.D.  Pre visit review using our clinic review tool, if applicable. No additional management support is needed unless otherwise documented below in the visit note.

## 2013-11-28 NOTE — Patient Instructions (Addendum)
Goal of bp is below 140 /90  Reformulating  Medication.   Take 1/2 of new formulation Mediterranean  And dash diet helpful.  Sign up for my chart and send in bp readings in a month  Get a chest x ray and  Amy order pulmonary function tests about the sarcoid .  Monitoring .  Will refer for routine colonoscopy.  [name] in about 3 months to check bp  And medication change

## 2013-12-23 ENCOUNTER — Encounter: Payer: Self-pay | Admitting: Internal Medicine

## 2014-01-02 ENCOUNTER — Ambulatory Visit (INDEPENDENT_AMBULATORY_CARE_PROVIDER_SITE_OTHER): Payer: BC Managed Care – PPO | Admitting: Internal Medicine

## 2014-01-02 DIAGNOSIS — R059 Cough, unspecified: Secondary | ICD-10-CM

## 2014-01-02 DIAGNOSIS — D869 Sarcoidosis, unspecified: Secondary | ICD-10-CM

## 2014-01-02 DIAGNOSIS — R05 Cough: Secondary | ICD-10-CM

## 2014-01-02 LAB — PULMONARY FUNCTION TEST
DL/VA % PRED: 97 %
DL/VA: 4.35 ml/min/mmHg/L
DLCO UNC % PRED: 111 %
DLCO unc: 32.2 ml/min/mmHg
FEF 25-75 PRE: 3.58 L/s
FEF 25-75 Post: 3.66 L/sec
FEF2575-%Change-Post: 2 %
FEF2575-%PRED-PRE: 111 %
FEF2575-%Pred-Post: 114 %
FEV1-%Change-Post: 0 %
FEV1-%PRED-PRE: 123 %
FEV1-%Pred-Post: 125 %
FEV1-Post: 4.49 L
FEV1-Pre: 4.45 L
FEV1FVC-%Change-Post: 0 %
FEV1FVC-%Pred-Pre: 96 %
FEV6-%Change-Post: 2 %
FEV6-%PRED-POST: 133 %
FEV6-%Pred-Pre: 130 %
FEV6-POST: 5.93 L
FEV6-PRE: 5.8 L
FEV6FVC-%CHANGE-POST: 0 %
FEV6FVC-%Pred-Post: 103 %
FEV6FVC-%Pred-Pre: 103 %
FVC-%Change-Post: 1 %
FVC-%Pred-Post: 129 %
FVC-%Pred-Pre: 127 %
FVC-POST: 5.98 L
FVC-Pre: 5.9 L
POST FEV1/FVC RATIO: 75 %
POST FEV6/FVC RATIO: 99 %
Pre FEV1/FVC ratio: 75 %
Pre FEV6/FVC Ratio: 99 %
RV % pred: 81 %
RV: 1.56 L
TLC % PRED: 115 %
TLC: 7.44 L

## 2014-01-02 NOTE — Progress Notes (Signed)
PFT done today. 

## 2014-01-30 ENCOUNTER — Encounter: Payer: Self-pay | Admitting: Internal Medicine

## 2014-02-17 ENCOUNTER — Telehealth: Payer: Self-pay | Admitting: Internal Medicine

## 2014-02-17 NOTE — Telephone Encounter (Signed)
Pt needs new rx generic adderall xr 30 mg#30. Pt would like rxs for next 3 months

## 2014-02-18 MED ORDER — AMPHETAMINE-DEXTROAMPHET ER 30 MG PO CP24: 30.0000 mg | ORAL_CAPSULE | ORAL | Status: DC

## 2014-02-18 NOTE — Telephone Encounter (Signed)
Pt notified to pick up at the front desk. 

## 2014-02-27 ENCOUNTER — Encounter: Payer: Self-pay | Admitting: Internal Medicine

## 2014-02-27 ENCOUNTER — Ambulatory Visit (INDEPENDENT_AMBULATORY_CARE_PROVIDER_SITE_OTHER): Payer: BC Managed Care – PPO | Admitting: Internal Medicine

## 2014-02-27 VITALS — BP 130/90 | Temp 98.3°F | Ht 67.75 in | Wt 161.0 lb

## 2014-02-27 DIAGNOSIS — R059 Cough, unspecified: Secondary | ICD-10-CM

## 2014-02-27 DIAGNOSIS — R05 Cough: Secondary | ICD-10-CM

## 2014-02-27 DIAGNOSIS — I10 Essential (primary) hypertension: Secondary | ICD-10-CM

## 2014-02-27 DIAGNOSIS — Z79899 Other long term (current) drug therapy: Secondary | ICD-10-CM

## 2014-02-27 NOTE — Progress Notes (Signed)
Chief Complaint  Patient presents with  . Follow-up    HPI: Fu bp change .  When calm  In the 130/90 per less range Exercise  Not that much except biking etc . Because of the weather Worse with anxiety cough.   Slept better with  wifes cough medication. Just getting over a respiratory infection. Denies increasing symptoms of sleep apnea. ROS: See pertinent positives and negatives per HPI. Leg pain is better having changed medication blood pressure is taking a half of 100/12.5 Hyzaar  Past Medical History  Diagnosis Date  . Migraine   . Sarcoidosis     bronchoscopy 2006, mtx started 04/19/10 try off 11/29/10 (atypical cp/ ? worsening ct chest wlh) off prednisone 7/11  . Refusal of blood transfusions as patient is Jehovah's Witness   . Nephrolithiasis     hx of episode 1/10 calcium stones  . ADD (attention deficit disorder with hyperactivity)     formal evaluation  . OSA (obstructive sleep apnea)     mild, Apnealink 7 2011 RDI7, O2 nadir 83%  . 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 MIGRAINE HEADACHE 08/30/2007    Qualifier: Diagnosis of  By: Sherren Mocha RN, Dorian Pod      Family History  Problem Relation Age of Onset  . Stroke      Grandfather father older age    History   Social History  . Marital Status: Married    Spouse Name: N/A    Number of Children: N/A  . Years of Education: N/A   Social History Main Topics  . Smoking status: Never Smoker   . Smokeless tobacco: None  . Alcohol Use:   . Drug Use:   . Sexual Activity:    Other Topics Concern  . 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   Had to move ;last year and sell house because of financial difficulties in business.   Better now.    M in law died last year was in [facility] and wife caretaker.    Family in [place]   No tobacco or ETS    Outpatient Encounter Prescriptions as of 02/27/2014  Medication Sig  .  amphetamine-dextroamphetamine (ADDERALL XR) 30 MG 24 hr capsule Take 1 capsule (30 mg total) by mouth every morning.  [name] amphetamine-dextroamphetamine (ADDERALL XR) 30 MG 24 hr capsule Take 1 capsule (30 mg total) by mouth every morning.  [name] amphetamine-dextroamphetamine (ADDERALL XR) 30 MG 24 hr capsule Take 1 capsule (30 mg total) by mouth every morning.  [name] losartan-hydrochlorothiazide (HYZAAR) 100-12.5 MG per tablet Take 1 tablet by mouth daily. As directed    EXAM:  BP 130/90  Temp(Src) 98.3 F (36.8 C) (Oral)  Ht 5' 7.75" (1.721 m)  Wt 161 lb (73.029 kg)  BMI 24.66 kg/m2  Body mass index is 24.66 kg/(m^2).  GENERAL: vitals reviewed and listed above, alert, oriented, appears well hydrated and in no acute distress NECK: no obvious masses on inspection palpation  LUNGS: clear to auscultation bilaterally, no wheezes, rales or rhonchi, good air movement CV: HRRR, no clubbing cyanosis or  peripheral edema nl cap refill  MS: moves all extremities without noticeable focal  abnormality PSYCH: pleasant and cooperative, no obvious depression or anxiety Lab Results  Component Value Date   WBC 6.7 05/14/2013   HGB 16.0 05/14/2013   HCT 46.1 05/14/2013  PLT 320.0 05/14/2013   GLUCOSE 99 06/14/2013   CHOL 205* 05/14/2013   TRIG 115.0 05/14/2013   HDL 46.30 05/14/2013   LDLDIRECT 142.6 05/14/2013   LDLCALC 134* 05/18/2012   ALT 33 05/14/2013   AST 25 05/14/2013   NA 138 06/14/2013   K 3.6 06/14/2013   CL 106 06/14/2013   CREATININE 1.0 06/14/2013   BUN 17 06/14/2013   CO2 24 06/14/2013   TSH 1.06 05/14/2013   INR 1.04 05/07/2010    ASSESSMENT AND PLAN:  Discussed the following assessment and plan:  Hypertension - Systolic controlled diastolic borderline would rather have it lower but implement lifestyle recheck  Cough - Some habit resolving RTI , get chest x-ray done consider seeing pulmonary if progressive continuing  Medication management Could add and change medications for diastolic blood  pressure but trying to make it simple he may be able to get it down with lifestyle in addition. -Patient advised to return or notify health care team  if symptoms worsen ,persist or new concerns arise.  Patient Instructions  Stay on same medictoin for now.   Intensify lifestyle interventions. moinitor bp   Goal if below  140/90   if cough is  persistent or progressive and interfere ing with sleep . consider seeing pulmonary.  again.  Get chest Xray.  In the meantime. Wellness visit in June . With full set of labs    Huntington K. Mabrey Howland M.D.  Pre visit review using our clinic review tool, if applicable. No additional management support is needed unless otherwise documented below in the visit note.

## 2014-02-27 NOTE — Patient Instructions (Signed)
Stay on same medictoin for now.   Intensify lifestyle interventions. moinitor bp   Goal if below  140/90   if cough is  persistent or progressive and interfere ing with sleep . consider seeing pulmonary.  again.  Get chest Xray.  In the meantime. Wellness visit in June . With full set of labs

## 2014-02-28 ENCOUNTER — Telehealth: Payer: Self-pay | Admitting: Internal Medicine

## 2014-02-28 NOTE — Telephone Encounter (Signed)
Relevant patient education assigned to patient using Emmi. ° °

## 2014-03-10 ENCOUNTER — Encounter: Payer: Self-pay | Admitting: Internal Medicine

## 2014-03-10 ENCOUNTER — Ambulatory Visit (AMBULATORY_SURGERY_CENTER): Payer: Self-pay | Admitting: *Deleted

## 2014-03-10 VITALS — Ht 67.75 in | Wt 161.2 lb

## 2014-03-10 DIAGNOSIS — Z1211 Encounter for screening for malignant neoplasm of colon: Secondary | ICD-10-CM

## 2014-03-10 NOTE — Progress Notes (Signed)
Patient denies any allergies to eggs. Patient states soy does not agree with him. Patient denies any problems with anesthesia. Patient request to change appointment date. Patient instructed to go to 3 rd floor to reschedule appointments.

## 2014-04-07 ENCOUNTER — Encounter: Payer: BC Managed Care – PPO | Admitting: Internal Medicine

## 2014-04-24 ENCOUNTER — Ambulatory Visit (AMBULATORY_SURGERY_CENTER): Payer: Self-pay

## 2014-04-24 VITALS — Ht 67.5 in | Wt 162.0 lb

## 2014-04-24 DIAGNOSIS — Z1211 Encounter for screening for malignant neoplasm of colon: Secondary | ICD-10-CM

## 2014-04-24 MED ORDER — MOVIPREP 100 G PO SOLR: 1.0000 | Freq: Once | ORAL | Status: DC

## 2014-04-24 NOTE — Progress Notes (Signed)
NO BLOOD OR BLOOD PRODUCTS 

## 2014-04-24 NOTE — Progress Notes (Signed)
No allergies to eggs or soy No home oxygen No diet/weight loss meds No past problems with anesthesia  Has email  Emmi instructions given for colonoscopy 

## 2014-04-25 ENCOUNTER — Encounter: Payer: Self-pay | Admitting: Internal Medicine

## 2014-05-08 ENCOUNTER — Ambulatory Visit (AMBULATORY_SURGERY_CENTER): Payer: BC Managed Care – PPO | Admitting: Internal Medicine

## 2014-05-08 ENCOUNTER — Encounter: Payer: Self-pay | Admitting: Internal Medicine

## 2014-05-08 VITALS — BP 129/77 | HR 70 | Temp 96.1°F | Resp 16 | Ht 67.5 in | Wt 162.0 lb

## 2014-05-08 DIAGNOSIS — Z1211 Encounter for screening for malignant neoplasm of colon: Secondary | ICD-10-CM

## 2014-05-08 DIAGNOSIS — D126 Benign neoplasm of colon, unspecified: Secondary | ICD-10-CM

## 2014-05-08 MED ORDER — SODIUM CHLORIDE 0.9 % IV SOLN: 500.0000 mL | INTRAVENOUS | Status: DC

## 2014-05-08 NOTE — Progress Notes (Signed)
Patient to restroom following dressing.

## 2014-05-08 NOTE — Progress Notes (Signed)
Called to room to assist during endoscopic procedure.  Patient ID and intended procedure confirmed with present staff. Received instructions for my participation in the procedure from the performing physician.  

## 2014-05-08 NOTE — Patient Instructions (Signed)
YOU HAD AN ENDOSCOPIC PROCEDURE TODAY AT THE [facility]: Refer to the procedure report that was given to you for any specific questions about what was found during the examination.  If the procedure report does not answer your questions, please call your gastroenterologist to clarify.  If you requested that your care partner not be given the details of your procedure findings, then the procedure report has been included in a sealed envelope for you to review at your convenience later.  YOU SHOULD EXPECT: Some feelings of bloating in the abdomen. Passage of more gas than usual.  Walking can help get rid of the air that was put into your GI tract during the procedure and reduce the bloating. If you had a lower endoscopy (such as a colonoscopy or flexible sigmoidoscopy) you may notice spotting of blood in your stool or on the toilet paper. If you underwent a bowel prep for your procedure, then you may not have a normal bowel movement for a few days.  DIET: Your first meal following the procedure should be a light meal and then it is ok to progress to your normal diet.  A half-sandwich or bowl of soup is an example of a good first meal.  Heavy or fried foods are harder to digest and may make you feel nauseous or bloated.  Likewise meals heavy in dairy and vegetables can cause extra gas to form and this can also increase the bloating.  Drink plenty of fluids but you should avoid alcoholic beverages for 24 hours.  ACTIVITY: Your care partner should take you home directly after the procedure.  You should plan to take it easy, moving slowly for the rest of the day.  You can resume normal activity the day after the procedure however you should NOT DRIVE or use heavy machinery for 24 hours (because of the sedation medicines used during the test).    SYMPTOMS TO REPORT IMMEDIATELY: A gastroenterologist can be reached at any hour.  During normal business hours, 8:30 AM to 5:00 PM Monday through Friday,  call [phone].  After hours and on weekends, please call the GI answering service at [phone] who will take a message and have the physician on call contact you.   Following lower endoscopy (colonoscopy or flexible sigmoidoscopy):  Excessive amounts of blood in the stool  Significant tenderness or worsening of abdominal pains  Swelling of the abdomen that is new, acute  Fever of 100F or higher    FOLLOW UP: If any biopsies were taken you will be contacted by phone or by letter within the next 1-3 weeks.  Call your gastroenterologist if you have not heard about the biopsies in 3 weeks.  Our staff will call the home number listed on your records the next business day following your procedure to check on you and address any questions or concerns that you may have at that time regarding the information given to you following your procedure. This is a courtesy call and so if there is no answer at the home number and we have not heard from you through the emergency physician on call, we will assume that you have returned to your regular daily activities without incident.  SIGNATURES/CONFIDENTIALITY: You and/or your care partner have signed paperwork which will be entered into your electronic medical record.  These signatures attest to the fact that that the information above on your After Visit Summary has been reviewed and is understood.  Full responsibility of the confidentiality   of this discharge information lies with you and/or your care-partner.     

## 2014-05-08 NOTE — Op Note (Signed)
[place]  [address] [place], [address]   COLONOSCOPY PROCEDURE REPORT  PATIENT: Barry Silva, Barry Silva.  MR#: [id number] BIRTHDATE: 10/02/1961 , 53  yrs. old GENDER: Male ENDOSCOPIST: Jerene Bears, MD REFERRED LO:VFIEP Darnelle Going, M.D. PROCEDURE DATE:  05/08/2014 PROCEDURE:   Colonoscopy with snare polypectomy First Screening Colonoscopy - Avg.  risk and is 50 yrs.  old or older Yes.  Prior Negative Screening - Now for repeat screening. N/A  History of Adenoma - Now for follow-up colonoscopy & has been > or = to 3 yrs.  N/A  Polyps Removed Today? Yes. ASA CLASS:   Class II INDICATIONS:average risk screening and first colonoscopy. MEDICATIONS: MAC sedation, administered by CRNA and propofol (Diprivan) 300mg  IV  DESCRIPTION OF PROCEDURE:   After the risks benefits and alternatives of the procedure were thoroughly explained, informed consent was obtained.  A digital rectal exam revealed no rectal mass.   The LB [id number]  endoscope was introduced through the anus and advanced to the cecum, which was identified by both the appendix and ileocecal valve. No adverse events experienced. The quality of the prep was good, using MoviPrep  The instrument was then slowly withdrawn as the colon was fully examined.   COLON FINDINGS: A sessile polyp measuring 6 mm in size was found at the hepatic flexure.  A polypectomy was performed with a cold snare.  The resection was complete and the polyp tissue was completely retrieved.   Moderate diverticulosis was noted in the descending colon and sigmoid colon.  Retroflexed views revealed no abnormalities. The time to cecum=3 minutes 05 seconds.  Withdrawal time=10 minutes 33 seconds.  The scope was withdrawn and the procedure completed. COMPLICATIONS: There were no complications.  ENDOSCOPIC IMPRESSION: 1.   Sessile polyp measuring 6 mm in size was found at the hepatic flexure; polypectomy was performed with a cold  snare 2.   Moderate diverticulosis was noted in the descending colon and sigmoid colon  RECOMMENDATIONS: 1.  Await pathology results 2.  High fiber diet 3.  If the polyp removed today is proven to be an adenomatous (pre-cancerous) polyp, you will need a repeat colonoscopy in 5 years.  Otherwise you should continue to follow colorectal cancer screening guidelines for "routine risk" patients with colonoscopy in 10 years.  You will receive a letter within 1-2 weeks with the results of your biopsy as well as final recommendations.  Please call my office if you have not received a letter after 3 weeks.   eSigned:  Jerene Bears, MD 05/08/2014 10:11 AM cc: The Patient and Burnis Medin, MD

## 2014-05-09 ENCOUNTER — Telehealth: Payer: Self-pay | Admitting: *Deleted

## 2014-05-09 NOTE — Telephone Encounter (Signed)
No answer, message left for the patient. 

## 2014-05-19 ENCOUNTER — Encounter: Payer: Self-pay | Admitting: Internal Medicine

## 2014-05-19 ENCOUNTER — Other Ambulatory Visit: Payer: BC Managed Care – PPO

## 2014-05-23 ENCOUNTER — Other Ambulatory Visit (INDEPENDENT_AMBULATORY_CARE_PROVIDER_SITE_OTHER): Payer: BC Managed Care – PPO

## 2014-05-23 DIAGNOSIS — Z0000 Encounter for general adult medical examination without abnormal findings: Secondary | ICD-10-CM

## 2014-05-23 LAB — CBC WITH DIFFERENTIAL/PLATELET
BASOS PCT: 1 % (ref 0.0–3.0)
Basophils Absolute: 0.1 10*3/uL (ref 0.0–0.1)
EOS PCT: 2.2 % (ref 0.0–5.0)
Eosinophils Absolute: 0.1 10*3/uL (ref 0.0–0.7)
HCT: 45.8 % (ref 39.0–52.0)
Hemoglobin: 15.8 g/dL (ref 13.0–17.0)
LYMPHS ABS: 1.2 10*3/uL (ref 0.7–4.0)
Lymphocytes Relative: 22 % (ref 12.0–46.0)
MCHC: 34.5 g/dL (ref 30.0–36.0)
MCV: 89 fl (ref 78.0–100.0)
MONO ABS: 0.7 10*3/uL (ref 0.1–1.0)
Monocytes Relative: 12.8 % — ABNORMAL HIGH (ref 3.0–12.0)
NEUTROS PCT: 62 % (ref 43.0–77.0)
Neutro Abs: 3.4 10*3/uL (ref 1.4–7.7)
PLATELETS: 296 10*3/uL (ref 150.0–400.0)
RBC: 5.15 Mil/uL (ref 4.22–5.81)
RDW: 13.1 % (ref 11.5–15.5)
WBC: 5.5 10*3/uL (ref 4.0–10.5)

## 2014-05-23 LAB — BASIC METABOLIC PANEL
BUN: 15 mg/dL (ref 6–23)
CO2: 30 meq/L (ref 19–32)
CREATININE: 0.9 mg/dL (ref 0.4–1.5)
Calcium: 9.3 mg/dL (ref 8.4–10.5)
Chloride: 102 mEq/L (ref 96–112)
GFR: 90.98 mL/min (ref >60.00)
Glucose, Bld: 78 mg/dL (ref 70–99)
POTASSIUM: 4 meq/L (ref 3.5–5.1)
Sodium: 138 mEq/L (ref 135–145)

## 2014-05-23 LAB — LIPID PANEL
CHOL/HDL RATIO: 4
Cholesterol: 191 mg/dL (ref 0–200)
HDL: 53.7 mg/dL (ref >39.00)
LDL CALC: 125 mg/dL — AB (ref 0–99)
NonHDL: 137.3
TRIGLYCERIDES: 60 mg/dL (ref 0.0–149.0)
VLDL: 12 mg/dL (ref 0.0–40.0)

## 2014-05-23 LAB — HEPATIC FUNCTION PANEL
ALBUMIN: 3.9 g/dL (ref 3.5–5.2)
ALT: 52 U/L (ref 0–53)
AST: 51 U/L — ABNORMAL HIGH (ref 0–37)
Alkaline Phosphatase: 71 U/L (ref 39–117)
BILIRUBIN TOTAL: 1.4 mg/dL — AB (ref 0.2–1.2)
Bilirubin, Direct: 0.2 mg/dL (ref 0.0–0.3)
Total Protein: 6.4 g/dL (ref 6.0–8.3)

## 2014-05-23 LAB — TSH: TSH: 1.12 u[IU]/mL (ref 0.35–4.50)

## 2014-05-23 LAB — PSA: PSA: 0.97 ng/mL (ref 0.10–4.00)

## 2014-05-30 ENCOUNTER — Ambulatory Visit (INDEPENDENT_AMBULATORY_CARE_PROVIDER_SITE_OTHER): Payer: BC Managed Care – PPO | Admitting: Internal Medicine

## 2014-05-30 ENCOUNTER — Encounter: Payer: Self-pay | Admitting: Internal Medicine

## 2014-05-30 VITALS — BP 134/84 | Temp 98.9°F | Ht 67.25 in | Wt 160.0 lb

## 2014-05-30 DIAGNOSIS — R945 Abnormal results of liver function studies: Secondary | ICD-10-CM

## 2014-05-30 DIAGNOSIS — Z0000 Encounter for general adult medical examination without abnormal findings: Secondary | ICD-10-CM

## 2014-05-30 DIAGNOSIS — I10 Essential (primary) hypertension: Secondary | ICD-10-CM

## 2014-05-30 DIAGNOSIS — Z79899 Other long term (current) drug therapy: Secondary | ICD-10-CM

## 2014-05-30 DIAGNOSIS — R7989 Other specified abnormal findings of blood chemistry: Secondary | ICD-10-CM

## 2014-05-30 DIAGNOSIS — F988 Other specified behavioral and emotional disorders with onset usually occurring in childhood and adolescence: Secondary | ICD-10-CM

## 2014-05-30 MED ORDER — AMPHETAMINE-DEXTROAMPHET ER 30 MG PO CP24: 30.0000 mg | ORAL_CAPSULE | ORAL | Status: DC

## 2014-05-30 NOTE — Progress Notes (Signed)
Chief Complaint  Patient presents with  . Annual Exam  . ADHD  . Hypertension    HPI: Patient comes in today for Preventive Health Care visit  No major change in health status since last visit . Adhd :  Once a day  No sig se.  On adderall.bp / ok rusing to day no se of med  bp doing ok. LIFESTYLE:  Exercise:   yes Tobacco/ETS:no Alcohol: per  4 week.  Sugar beverages: no Sleep:  5-7 hours  Drug use: no Colonoscopy:  Had colonoscopy.  Small polyp.  5 yr recall  Health Maintenance  Topic Date Due  . Influenza Vaccine  07/19/2014  . Tetanus/tdap  09/06/2017  . Colonoscopy  05/09/2019   Health Maintenance Review ROS:  GEN/ HEENT: No fever, significant weight changes sweats headaches vision problems hearing changes,picks at nails foeever  Nail changes  Better recnetly  CV/ PULM; No chest pain shortness of breath cough, syncope,edema  change in exercise tolerance. Cough when anxious.  GI /GU: No adominal pain, vomiting, change in bowel habits. No blood in the stool. No significant GU symptoms. SKIN/HEME: ,no acute skin rashes suspicious lesions or bleeding. No lymphadenopathy, nodules, masses.  NEURO/ PSYCH:  No neurologic signs such as weakness numbness.  Lateral foot  Dec senseNo depression anxiety. IMM/ Allergy: No unusual infections.  Allergy .   REST of 12 system review negative except as per HPI   Past Medical History  Diagnosis Date  . Migraine   . Sarcoidosis     bronchoscopy 2006, mtx started 04/19/10 try off 11/29/10 (atypical cp/ ? worsening ct chest wlh) off prednisone 7/11  . Refusal of blood transfusions as patient is Jehovah's Witness   . Nephrolithiasis     hx of episode 1/10 calcium stones  . ADD (attention deficit disorder with hyperactivity)     formal evaluation  . OSA (obstructive sleep apnea)     mild, Apnealink 7 2011 RDI7, O2 nadir 83%  . 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 MIGRAINE HEADACHE 08/30/2007    Qualifier:  Diagnosis of  By: Sherren Mocha RN, Dorian Pod      Family History  Problem Relation Age of Onset  . Stroke      Grandfather father older age  . Colon cancer Neg Hx   . Esophageal cancer Neg Hx   . Rectal cancer Neg Hx   . Stomach cancer Neg Hx     History   Social History  . Marital Status: Married    Spouse Name: N/A    Number of Children: N/A  . Years of Education: N/A   Social History Main Topics  . Smoking status: Never Smoker   . Smokeless tobacco: Never Used  . Alcohol Use: 4.8 oz/week    8 Glasses of wine per week  . Drug Use: No  . Sexual Activity: None   Other Topics Concern  . 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   Had to move ;last year and sell house because of financial difficulties in business.   Better now.    M in law died last year was in [facility] and wife caretaker.    Family in [place]   No tobacco or ETS    Outpatient Encounter Prescriptions as of 05/30/2014  Medication Sig  . amphetamine-dextroamphetamine (ADDERALL XR) 30 MG 24 hr capsule  Take 1 capsule (30 mg total) by mouth every morning.  [name] losartan-hydrochlorothiazide (HYZAAR) 100-12.5 MG per tablet Take 1 tablet by mouth daily. As directed  . [DISCONTINUED] amphetamine-dextroamphetamine (ADDERALL XR) 30 MG 24 hr capsule Take 1 capsule (30 mg total) by mouth every morning.  . [DISCONTINUED] amphetamine-dextroamphetamine (ADDERALL XR) 30 MG 24 hr capsule Take 1 capsule (30 mg total) by mouth every morning.  . [DISCONTINUED] amphetamine-dextroamphetamine (ADDERALL XR) 30 MG 24 hr capsule Take 1 capsule (30 mg total) by mouth every morning.    EXAM:  BP 134/84  Temp(Src) 98.9 F (37.2 C) (Oral)  Ht 5' 7.25" (1.708 m)  Wt 160 lb (72.576 kg)  BMI 24.88 kg/m2  Body mass index is 24.88 kg/(m^2).  Physical Exam: Vital signs reviewed UDJ:SHFW is a well-developed well-nourished alert cooperative    who appearsr stated age in no acute distress.    HEENT: normocephalic atraumatic , Eyes: PERRL EOM's full, conjunctiva clear, Nares: paten,t no deformity discharge or tenderness., Ears: no deformity EAC's clear TMs with normal landmarks. Mouth: clear OP, no lesions, edema.  Moist mucous membranes. Dentition in adequate repair. NECK: supple without masses, thyromegaly or bruits. CHEST/PULM:  Clear to auscultation and percussion breath sounds equal no wheeze , rales or rhonchi. No chest wall deformities or tenderness. CV: PMI is nondisplaced, S1 S2 no gallops, murmurs, rubs. Peripheral pulses are full without delay.No JVD .  ABDOMEN: Bowel sounds normal nontender  No guard or rebound, no hepato splenomegal no CVA tenderness.  No hernia. Extremtities:  No clubbing cyanosis or edema, no acute joint swelling or redness no focal atrophy NEURO:  Oriented x3, cranial nerves 3-12 appear to be intact, no obvious focal weakness,gait within normal limits no abnormal reflexes or asymmetrical lat foot dec sense SKIN: No acute rashes normal turgor, color, no bruising or petechiae. Nails left later toenails thickened  cutilcles reddened  ( picks at thes some tarumatic nail changes ) No other abnormalities  PSYCH: Oriented, good eye contact, no obvious depression anxiety, cognition and judgment appear normal. LN: no cervical axillary inguinal adenopathy  Lab Results  Component Value Date   WBC 5.5 05/23/2014   HGB 15.8 05/23/2014   HCT 45.8 05/23/2014   PLT 296.0 05/23/2014   GLUCOSE 78 05/23/2014   CHOL 191 05/23/2014   TRIG 60.0 05/23/2014   HDL 53.70 05/23/2014   LDLDIRECT 142.6 05/14/2013   LDLCALC 125* 05/23/2014   ALT 52 05/23/2014   AST 51* 05/23/2014   NA 138 05/23/2014   K 4.0 05/23/2014   CL 102 05/23/2014   CREATININE 0.9 05/23/2014   BUN 15 05/23/2014   CO2 30 05/23/2014   TSH 1.12 05/23/2014   PSA 0.97 05/23/2014   INR 1.04 05/07/2010    ASSESSMENT AND PLAN:  Discussed the following assessment and plan:  Encounter for preventive health examination  Medication  management  Attention deficit disorder without mention of hyperactivity - benefot more than risk   Hypertension - beter on repeat.   Abnormal LFTs - minor elevation Counseled regarding healthy nutrition, exercise, sleep, injury prevention, calcium vit d and healthy weight . Injury prevention sun screenetc Patient Care Team: Burnis Medin, MD as PCP - General Chesley Mires, MD (Pulmonary Disease) Patient Instructions  Continue lifestyle intervention healthy eating and exercise . Bp control . Sun protection face ears tops   Neck and eyes. Minor liver abnormality  Many causes that can resolve with time. Avoid excess  advil aleve and alcohol.  In the interim. Recheck  lab  Before next visit. In 6 months     Standley Brooking. Panosh M.D.  Pre visit review using our clinic review tool, if applicable. No additional management support is needed unless otherwise documented below in the visit note.

## 2014-05-30 NOTE — Patient Instructions (Signed)
Continue lifestyle intervention healthy eating and exercise . Bp control . Sun protection face ears tops   Neck and eyes. Minor liver abnormality  Many causes that can resolve with time. Avoid excess  advil aleve and alcohol.  In the interim. Recheck lab  Before next visit. In 6 months

## 2014-07-31 ENCOUNTER — Other Ambulatory Visit: Payer: Self-pay | Admitting: Internal Medicine

## 2014-08-01 NOTE — Telephone Encounter (Signed)
Sent to the pharmacy by e-scribe. 

## 2014-10-07 ENCOUNTER — Telehealth: Payer: Self-pay | Admitting: Internal Medicine

## 2014-10-07 MED ORDER — AMPHETAMINE-DEXTROAMPHET ER 30 MG PO CP24: 30.0000 mg | ORAL_CAPSULE | ORAL | Status: DC

## 2014-10-07 NOTE — Telephone Encounter (Signed)
Pt request refill amphetamine-dextroamphetamine (ADDERALL XR) 30 MG 24 hr capsule 3 mo supply

## 2014-10-07 NOTE — Telephone Encounter (Signed)
Pt has a follow up on 12/01/14.  Will fill until then.  Pt notified to pick up at the front desk.

## 2014-11-06 ENCOUNTER — Ambulatory Visit: Payer: BC Managed Care – PPO | Admitting: Family Medicine

## 2014-11-06 ENCOUNTER — Telehealth: Payer: Self-pay | Admitting: *Deleted

## 2014-11-06 NOTE — Telephone Encounter (Signed)
Patient walked in at 4:50pm stating he has a hernia.  I triaged the pt and he stated he was diagnosed with an inguinal hernia in March 2015 after seeing a physician at [facility] and at that time he was told if he was not having any symptoms there was nothing to do.  Patient now complains of discomfort, not severe pain in the LLQ x2 weeks and bulging in this area.  Denies any fever, chills, nausea or vomiting, nor urinary complaints.  BP 112/84  Pulse 90--Patient was advised per Dr Maudie Mercury to go to the ER if he is having severe pain and send a message to Dr Regis Bill or make an appt to see Dr Regis Bill since she has not seen the patient.  Estill Bamberg spoke with the pt and scheduled an appt with Dr Regis Bill as the pt denies severe pain.

## 2014-11-07 ENCOUNTER — Ambulatory Visit (INDEPENDENT_AMBULATORY_CARE_PROVIDER_SITE_OTHER): Payer: BC Managed Care – PPO | Admitting: Internal Medicine

## 2014-11-07 ENCOUNTER — Encounter: Payer: Self-pay | Admitting: Internal Medicine

## 2014-11-07 VITALS — BP 128/100 | Temp 99.1°F | Ht 67.25 in | Wt 166.0 lb

## 2014-11-07 DIAGNOSIS — Z2821 Immunization not carried out because of patient refusal: Secondary | ICD-10-CM

## 2014-11-07 DIAGNOSIS — K4090 Unilateral inguinal hernia, without obstruction or gangrene, not specified as recurrent: Secondary | ICD-10-CM

## 2014-11-07 NOTE — Progress Notes (Signed)
Pre visit review using our clinic review tool, if applicable. No additional management support is needed unless otherwise documented below in the visit note.   Chief Complaint  Patient presents with  . Hernia    Left Side.  Needs a surgery referral.    HPI: Asked to see patient as a work in  Because mis schedule time  Noted last spring off and on . Int bulging left ing  dindt report to [place] at his check up  And not noted . Seen [place] [facility]. At some point and dex [place]  with hernia but no referral at that time More recently cause noting chronic  swelling and noted all time  No acute pain     Elsewhere and didn't refer.   Then last few weeks  Getting bigger and feeling  .  So went to urgent care to get referral and indeed has appt with [place] [facility] dec 1   However    Because  wanted to get repair done before end of year   Comes in sda to  Discuss other paths .    ROS: See pertinent positives and negatives per HPI.  Past Medical History  Diagnosis Date  . Migraine   . Sarcoidosis     bronchoscopy 2006, mtx started 04/19/10 try off 11/29/10 (atypical cp/ ? worsening ct chest wlh) off prednisone 7/11  . Refusal of blood transfusions as patient is Jehovah's Witness   . Nephrolithiasis     hx of episode 1/10 calcium stones  . ADD (attention deficit disorder with hyperactivity)     formal evaluation  . OSA (obstructive sleep apnea)     mild, Apnealink 7 2011 RDI7, O2 nadir 83%  . 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 MIGRAINE HEADACHE 08/30/2007    Qualifier: Diagnosis of  By: Sherren Mocha RN, Dorian Pod      Family History  Problem Relation Age of Onset  . Stroke      Grandfather father older age  . Colon cancer Neg Hx   . Esophageal cancer Neg Hx   . Rectal cancer Neg Hx   . Stomach cancer Neg Hx     History   Social History  . Marital Status: Married    Spouse Name: N/A    Number of Children: N/A  . Years of Education: N/A   Social History  Main Topics  . Smoking status: Never Smoker   . Smokeless tobacco: Never Used  . Alcohol Use: 4.8 oz/week    8 Glasses of wine per week  . Drug Use: No  . Sexual Activity: None   Other Topics Concern  . 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   Had to move ;last year and sell house because of financial difficulties in business.   Better now.    M in law died last year was in [facility] and wife caretaker.    Family in [place]   No tobacco or ETS    Outpatient Encounter Prescriptions as of 11/07/2014  Medication Sig  . amphetamine-dextroamphetamine (ADDERALL XR) 30 MG 24 hr capsule Take 1 capsule (30 mg total) by mouth every morning.  [name] amphetamine-dextroamphetamine (ADDERALL XR) 30 MG 24 hr capsule Take 1 capsule (30 mg total) by mouth every morning.  [name] losartan-hydrochlorothiazide (HYZAAR) 100-12.5 MG per tablet TAKE 1 TABLET BY MOUTH  DAILY. AS DIRECTED    EXAM:  BP 128/100 mmHg  Temp(Src) 99.1 F (37.3 C) (Oral)  Ht 5' 7.25" (1.708 m)  Wt 166 lb (75.297 kg)  BMI 25.81 kg/m2  Body mass index is 25.81 kg/(m^2).  GENERAL: vitals reviewed and listed above, alert, oriented, appears well hydrated and in no acute distress abd standing soft left lower ing hernia   Scrotal area no mass no adenopathy reducible and non tender at this time MS: moves all extremities without noticeable focal  abnormality PSYCH: pleasant and cooperative, no obvious depression or anxiety  ASSESSMENT AND PLAN:  Discussed the following assessment and plan:  Left inguinal hernia - already has appt with surgery he can call for cancel list to call if pain urgently etc .  Influenza vaccination declined by patient  -Patient advised to return or notify health care team  if symptoms worsen ,persist or new concerns arise.  Patient Instructions  Keep surgery appt  Contact if pain etc asap.      Standley Brooking. Panosh M.D.

## 2014-11-07 NOTE — Telephone Encounter (Signed)
Pt was seen today . He already has appt with surgery on dec 1st  Just wanted to see if can get in  To surgeon earlier  So he could have repair before end  of year . Has mild  discomfort  Increase swelling that in past.

## 2014-11-07 NOTE — Telephone Encounter (Signed)
Seen in OV on 11/07/14

## 2014-11-08 DIAGNOSIS — K4090 Unilateral inguinal hernia, without obstruction or gangrene, not specified as recurrent: Secondary | ICD-10-CM | POA: Insufficient documentation

## 2014-11-08 NOTE — Patient Instructions (Signed)
Keep surgery appt  Contact if pain etc asap.

## 2014-11-24 ENCOUNTER — Other Ambulatory Visit: Payer: Self-pay | Admitting: Family Medicine

## 2014-11-24 ENCOUNTER — Other Ambulatory Visit (INDEPENDENT_AMBULATORY_CARE_PROVIDER_SITE_OTHER): Payer: BC Managed Care – PPO

## 2014-11-24 DIAGNOSIS — R945 Abnormal results of liver function studies: Secondary | ICD-10-CM

## 2014-11-24 DIAGNOSIS — R7989 Other specified abnormal findings of blood chemistry: Secondary | ICD-10-CM

## 2014-11-24 LAB — HEPATIC FUNCTION PANEL
ALBUMIN: 3.9 g/dL (ref 3.5–5.2)
ALT: 63 U/L — ABNORMAL HIGH (ref 0–53)
AST: 56 U/L — AB (ref 0–37)
Alkaline Phosphatase: 73 U/L (ref 39–117)
Bilirubin, Direct: 0.1 mg/dL (ref 0.0–0.3)
Total Bilirubin: 1.3 mg/dL — ABNORMAL HIGH (ref 0.2–1.2)
Total Protein: 6.5 g/dL (ref 6.0–8.3)

## 2014-11-25 LAB — HEPATITIS C ANTIBODY: HCV AB: NEGATIVE

## 2014-11-25 LAB — HEPATITIS B SURFACE ANTIGEN: Hepatitis B Surface Ag: NEGATIVE

## 2014-11-25 LAB — HEPATITIS B SURFACE ANTIBODY,QUALITATIVE: Hep B S Ab: NEGATIVE

## 2014-12-01 ENCOUNTER — Encounter: Payer: Self-pay | Admitting: Internal Medicine

## 2014-12-01 ENCOUNTER — Ambulatory Visit (INDEPENDENT_AMBULATORY_CARE_PROVIDER_SITE_OTHER): Payer: BC Managed Care – PPO | Admitting: Internal Medicine

## 2014-12-01 VITALS — BP 140/82 | Temp 98.5°F | Ht 67.25 in | Wt 163.3 lb

## 2014-12-01 DIAGNOSIS — R945 Abnormal results of liver function studies: Secondary | ICD-10-CM

## 2014-12-01 DIAGNOSIS — R7989 Other specified abnormal findings of blood chemistry: Secondary | ICD-10-CM

## 2014-12-01 DIAGNOSIS — Z79899 Other long term (current) drug therapy: Secondary | ICD-10-CM

## 2014-12-01 DIAGNOSIS — F988 Other specified behavioral and emotional disorders with onset usually occurring in childhood and adolescence: Secondary | ICD-10-CM

## 2014-12-01 DIAGNOSIS — I10 Essential (primary) hypertension: Secondary | ICD-10-CM

## 2014-12-01 DIAGNOSIS — F909 Attention-deficit hyperactivity disorder, unspecified type: Secondary | ICD-10-CM

## 2014-12-01 HISTORY — DX: Abnormal results of liver function studies: R94.5

## 2014-12-01 HISTORY — DX: Other specified abnormal findings of blood chemistry: R79.89

## 2014-12-01 MED ORDER — AMPHETAMINE-DEXTROAMPHET ER 30 MG PO CP24: 30.0000 mg | ORAL_CAPSULE | ORAL | Status: DC

## 2014-12-01 NOTE — Progress Notes (Signed)
Pre visit review using our clinic review tool, if applicable. No additional management support is needed unless otherwise documented below in the visit note.  Chief Complaint  Patient presents with  . Follow-up    lfts add    HPI: Barry Silva  53 y.o. fu labs and adhd meds  saw the surgeon about the hernia waiting on  surgery time. BP :seems nl when  Relaxed 120/80 or so taking meds  Had some aleve ibu products at night as needed for ms pain .  ETOH 3-6 per week No cp sob .  ADHD meds still helps denies se mood sleep etc  Check spot can feel righ temple no bleeding some scale? ROS: See pertinent positives and negatives per HPI.see above  Past Medical History  Diagnosis Date  . Migraine   . Sarcoidosis     bronchoscopy 2006, mtx started 04/19/10 try off 11/29/10 (atypical cp/ ? worsening ct chest wlh) off prednisone 7/11  . Refusal of blood transfusions as patient is Jehovah's Witness   . Nephrolithiasis     hx of episode 1/10 calcium stones  . ADD (attention deficit disorder with hyperactivity)     formal evaluation  . OSA (obstructive sleep apnea)     mild, Apnealink 7 2011 RDI7, O2 nadir 83%  . 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 MIGRAINE HEADACHE 08/30/2007    Qualifier: Diagnosis of  By: Sherren Mocha RN, Dorian Pod      Family History  Problem Relation Age of Onset  . Stroke      Grandfather father older age  . Colon cancer Neg Hx   . Esophageal cancer Neg Hx   . Rectal cancer Neg Hx   . Stomach cancer Neg Hx     History   Social History  . Marital Status: Married    Spouse Name: N/A    Number of Children: N/A  . Years of Education: N/A   Social History Main Topics  . Smoking status: Never Smoker   . Smokeless tobacco: Never Used  . Alcohol Use: 4.8 oz/week    8 Glasses of wine per week  . Drug Use: No  . Sexual Activity: None   Other Topics Concern  . 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   Had to move ;last year and sell house because of financial difficulties in business.   Better now.    M in law died last year was in [facility] and wife caretaker.    Family in [place]   No tobacco or ETS    Outpatient Encounter Prescriptions as of 12/01/2014  Medication Sig  . amphetamine-dextroamphetamine (ADDERALL XR) 30 MG 24 hr capsule Take 1 capsule (30 mg total) by mouth every morning.  [name] amphetamine-dextroamphetamine (ADDERALL XR) 30 MG 24 hr capsule Take 1 capsule (30 mg total) by mouth every morning.  [name] losartan-hydrochlorothiazide (HYZAAR) 100-12.5 MG per tablet TAKE 1 TABLET BY MOUTH DAILY. AS DIRECTED  . [DISCONTINUED] amphetamine-dextroamphetamine (ADDERALL XR) 30 MG 24 hr capsule Take 1 capsule (30 mg total) by mouth every morning.  . [DISCONTINUED] amphetamine-dextroamphetamine (ADDERALL XR) 30 MG 24 hr capsule Take 1 capsule (30 mg total) by mouth every morning.  . [DISCONTINUED] amphetamine-dextroamphetamine (ADDERALL XR) 30 MG 24 hr capsule Take 1 capsule (30 mg total) by mouth every morning.    EXAM:  BP 140/82  mmHg  Temp(Src) 98.5 F (36.9 C) (Oral)  Ht 5' 7.25" (1.708 m)  Wt 163 lb 4.8 oz (74.072 kg)  BMI 25.39 kg/m2  Body mass index is 25.39 kg/(m^2). Repeat bp was 140/82  Right  GENERAL: vitals reviewed and listed above, alert, oriented, appears well hydrated and in no acute distress HEENT: atraumatic, conjunctiva  clear, no obvious abnormalities on inspection of external nose and ears  NECK: no obvious masses on inspection palpation  LUNGS: clear to auscultation bilaterally, no wheezes, rales or rhonchi, good air movement Abdomen:  Sof,t normal bowel sounds without hepatosplenomegaly, no guarding rebound or masses CV: HRRR, no clubbing cyanosis or  peripheral edema nl cap refill  Skin right temple with 1 mm pin k red lesion smooth  Round  MS: moves all extremities without noticeable focal  abnormality  BP Readings from Last 3  Encounters:  12/01/14 140/82  11/07/14 128/100  05/30/14 134/84   Lab Results  Component Value Date   WBC 5.5 05/23/2014   HGB 15.8 05/23/2014   HCT 45.8 05/23/2014   PLT 296.0 05/23/2014   GLUCOSE 78 05/23/2014   CHOL 191 05/23/2014   TRIG 60.0 05/23/2014   HDL 53.70 05/23/2014   LDLDIRECT 142.6 05/14/2013   LDLCALC 125* 05/23/2014   ALT 63* 11/24/2014   AST 56* 11/24/2014   NA 138 05/23/2014   K 4.0 05/23/2014   CL 102 05/23/2014   CREATININE 0.9 05/23/2014   BUN 15 05/23/2014   CO2 30 05/23/2014   TSH 1.12 05/23/2014   PSA 0.97 05/23/2014   INR 1.04 05/07/2010    ASSESSMENT AND PLAN:  Discussed the following assessment and plan:  Abnormal LFTs - neg serologies avoid reg liver toxins and repeat in 2 months consider abd [place]  other labs if needed - Plan: Hepatic function panel, ANA, Ceruloplasmin, Ferritin, IBC panel  Essential hypertension - borderline control says ok when relaxed consider  intensification of therapy  or change med if needed  Medication management  Attention deficit disorder - benefit more than risk to continue med and rx for 3  30 days at this time abd [place] .[name]  Neg serologies  Skin lesion prob benign  follow -Patient advised to return or notify health care team  if symptoms worsen ,persist or new concerns arise.  Patient Instructions  Liver tests are abnormal but could be  From the  Ibuprofen etc. Avoid  Tylenol  advil aleve products and alcohol for a month. At least  And then repeat labs late jan or early feb I will put in orders.  Depending on labs plan fu.   Please make sure BP is below 140/90 out of office .   Bp repeat 140/82 right  If  ok then wellness visit in June or  thereabouts.,  area on left  Temporal area   Looks like  A benign mile but if changing may need to get derm to see.,  Take picture as discussed . No change in meds at this time        Standley Brooking. Panosh M.D.

## 2014-12-01 NOTE — Patient Instructions (Signed)
Liver tests are abnormal but could be  From the  Ibuprofen etc. Avoid  Tylenol  advil aleve products and alcohol for a month. At least  And then repeat labs late jan or early feb I will put in orders.  Depending on labs plan fu.   Please make sure BP is below 140/90 out of office .   Bp repeat 140/82 right  If  ok then wellness visit in June or  thereabouts.,  area on left  Temporal area   Looks like  A benign mile but if changing may need to get derm to see.,  Take picture as discussed . No change in meds at this time

## 2015-01-16 ENCOUNTER — Other Ambulatory Visit (INDEPENDENT_AMBULATORY_CARE_PROVIDER_SITE_OTHER): Payer: 59

## 2015-01-16 DIAGNOSIS — R945 Abnormal results of liver function studies: Secondary | ICD-10-CM

## 2015-01-16 DIAGNOSIS — R7989 Other specified abnormal findings of blood chemistry: Secondary | ICD-10-CM | POA: Diagnosis not present

## 2015-01-16 LAB — HEPATIC FUNCTION PANEL
ALBUMIN: 4 g/dL (ref 3.5–5.2)
ALK PHOS: 75 U/L (ref 39–117)
ALT: 35 U/L (ref 0–53)
AST: 32 U/L (ref 0–37)
BILIRUBIN TOTAL: 0.9 mg/dL (ref 0.2–1.2)
Bilirubin, Direct: 0.1 mg/dL (ref 0.0–0.3)
TOTAL PROTEIN: 6.6 g/dL (ref 6.0–8.3)

## 2015-01-16 LAB — IBC PANEL
Iron: 68 ug/dL (ref 42–165)
Saturation Ratios: 19.2 % — ABNORMAL LOW (ref 20.0–50.0)
TRANSFERRIN: 253 mg/dL (ref 212.0–360.0)

## 2015-01-16 LAB — FERRITIN: FERRITIN: 74.1 ng/mL (ref 22.0–322.0)

## 2015-01-19 LAB — ANA: ANA: NEGATIVE

## 2015-01-19 LAB — CERULOPLASMIN: Ceruloplasmin: 25 mg/dL (ref 18–36)

## 2015-03-27 ENCOUNTER — Telehealth: Payer: Self-pay | Admitting: Internal Medicine

## 2015-03-27 MED ORDER — AMPHETAMINE-DEXTROAMPHET ER 30 MG PO CP24: 30.0000 mg | ORAL_CAPSULE | ORAL | Status: DC

## 2015-03-27 NOTE — Telephone Encounter (Signed)
Spoke to the pt.  Informed him that he is due for his CPX in June.  Filled for 2 months until he can get on the schedule.

## 2015-03-27 NOTE — Telephone Encounter (Signed)
Pt request refill of the following: amphetamine-dextroamphetamine (ADDERALL XR) 30 MG 24 hr capsule   Pt said he only has 1 pill   Phamacy:

## 2015-05-22 ENCOUNTER — Telehealth: Payer: Self-pay | Admitting: Internal Medicine

## 2015-05-22 MED ORDER — AMPHETAMINE-DEXTROAMPHET ER 30 MG PO CP24: 30.0000 mg | ORAL_CAPSULE | ORAL | Status: DC

## 2015-05-22 NOTE — Telephone Encounter (Signed)
I called insurance and BRAND ADDERALL XR is preferred and does not require a PA.

## 2015-05-22 NOTE — Telephone Encounter (Signed)
° ° ° ° ° °

## 2015-05-22 NOTE — Telephone Encounter (Signed)
Spoke to the pt.  He stated that he needs a prior authorization.  Paid out of pocket for his last prescription.  When approved, send message back to me so I can contact the pt. Printed rx but will shred it.

## 2015-05-25 MED ORDER — ADDERALL XR 30 MG PO CP24: 30.0000 mg | ORAL_CAPSULE | Freq: Every day | ORAL | Status: DC

## 2015-05-25 NOTE — Telephone Encounter (Signed)
Pt notified to pickup name brand rx at the front desk.

## 2015-06-08 ENCOUNTER — Encounter: Payer: Self-pay | Admitting: Internal Medicine

## 2015-06-08 ENCOUNTER — Ambulatory Visit (INDEPENDENT_AMBULATORY_CARE_PROVIDER_SITE_OTHER): Payer: 59 | Admitting: Internal Medicine

## 2015-06-08 VITALS — BP 138/94 | Temp 98.4°F | Ht 67.5 in | Wt 167.3 lb

## 2015-06-08 DIAGNOSIS — I10 Essential (primary) hypertension: Secondary | ICD-10-CM | POA: Diagnosis not present

## 2015-06-08 DIAGNOSIS — Z79899 Other long term (current) drug therapy: Secondary | ICD-10-CM

## 2015-06-08 DIAGNOSIS — F909 Attention-deficit hyperactivity disorder, unspecified type: Secondary | ICD-10-CM

## 2015-06-08 DIAGNOSIS — Z0000 Encounter for general adult medical examination without abnormal findings: Secondary | ICD-10-CM | POA: Diagnosis not present

## 2015-06-08 DIAGNOSIS — L609 Nail disorder, unspecified: Secondary | ICD-10-CM | POA: Diagnosis not present

## 2015-06-08 DIAGNOSIS — L608 Other nail disorders: Secondary | ICD-10-CM | POA: Insufficient documentation

## 2015-06-08 DIAGNOSIS — F988 Other specified behavioral and emotional disorders with onset usually occurring in childhood and adolescence: Secondary | ICD-10-CM

## 2015-06-08 LAB — CBC WITH DIFFERENTIAL/PLATELET
BASOS PCT: 0.7 % (ref 0.0–3.0)
Basophils Absolute: 0 10*3/uL (ref 0.0–0.1)
EOS PCT: 2.4 % (ref 0.0–5.0)
Eosinophils Absolute: 0.2 10*3/uL (ref 0.0–0.7)
HCT: 45.8 % (ref 39.0–52.0)
Hemoglobin: 15.7 g/dL (ref 13.0–17.0)
LYMPHS ABS: 1.5 10*3/uL (ref 0.7–4.0)
Lymphocytes Relative: 22.4 % (ref 12.0–46.0)
MCHC: 34.3 g/dL (ref 30.0–36.0)
MCV: 89 fl (ref 78.0–100.0)
MONO ABS: 0.9 10*3/uL (ref 0.1–1.0)
MONOS PCT: 13.9 % — AB (ref 3.0–12.0)
NEUTROS ABS: 4.1 10*3/uL (ref 1.4–7.7)
Neutrophils Relative %: 60.6 % (ref 43.0–77.0)
Platelets: 292 10*3/uL (ref 150.0–400.0)
RBC: 5.14 Mil/uL (ref 4.22–5.81)
RDW: 12.9 % (ref 11.5–15.5)
WBC: 6.8 10*3/uL (ref 4.0–10.5)

## 2015-06-08 LAB — HEPATIC FUNCTION PANEL
ALK PHOS: 69 U/L (ref 39–117)
ALT: 27 U/L (ref 0–53)
AST: 22 U/L (ref 0–37)
Albumin: 4 g/dL (ref 3.5–5.2)
BILIRUBIN DIRECT: 0.2 mg/dL (ref 0.0–0.3)
Total Bilirubin: 1.8 mg/dL — ABNORMAL HIGH (ref 0.2–1.2)
Total Protein: 6.6 g/dL (ref 6.0–8.3)

## 2015-06-08 LAB — LIPID PANEL
Cholesterol: 196 mg/dL (ref 0–200)
HDL: 47.1 mg/dL (ref >39.00)
LDL Cholesterol: 133 mg/dL — ABNORMAL HIGH (ref 0–99)
NONHDL: 148.9
Total CHOL/HDL Ratio: 4
Triglycerides: 79 mg/dL (ref 0.0–149.0)
VLDL: 15.8 mg/dL (ref 0.0–40.0)

## 2015-06-08 LAB — BASIC METABOLIC PANEL
BUN: 19 mg/dL (ref 6–23)
CHLORIDE: 101 meq/L (ref 96–112)
CO2: 27 meq/L (ref 19–32)
Calcium: 9.2 mg/dL (ref 8.4–10.5)
Creatinine, Ser: 1.08 mg/dL (ref 0.40–1.50)
GFR: 76.25 mL/min (ref >60.00)
Glucose, Bld: 118 mg/dL — ABNORMAL HIGH (ref 70–99)
Potassium: 3.9 mEq/L (ref 3.5–5.1)
Sodium: 135 mEq/L (ref 135–145)

## 2015-06-08 LAB — TSH: TSH: 0.87 u[IU]/mL (ref 0.35–4.50)

## 2015-06-08 MED ORDER — AMPHETAMINE-DEXTROAMPHET ER 30 MG PO CP24: 30.0000 mg | ORAL_CAPSULE | ORAL | Status: DC

## 2015-06-08 NOTE — Progress Notes (Signed)
Pre visit review using our clinic review tool, if applicable. No additional management support is needed unless otherwise documented below in the visit note.  Chief Complaint  Patient presents with  . Annual Exam    adhd med managment     HPI: Patient  Barry Silva  54 y.o. comes in today for Preventive Health Care visit  And Chronic disease management:  BP :  Not checking it  Stressed   Today.   Taking med regular  Seems ok when resting No  depression ADHD: evey day use excep once on a week ? About insurance needed PA   Brand vs generic? LFTS: better on fu see last notes Nails  Get ridges but picks at them watching tv  Has some moles ? Irritated one on neck.  Hernia not fixed yet .   Health Maintenance  Topic Date Due  . HIV Screening  05/19/2016 (Originally 03/21/1976)  . INFLUENZA VACCINE  07/20/2015  . TETANUS/TDAP  09/06/2017  . COLONOSCOPY  05/09/2019   Health Maintenance Review LIFESTYLE:  Exercise:   [employer] and work . Hiking  Tobacco/ETS: no Alcohol:   Qod  5-10 per week. Sugar beverages: almost never  Sleep: 5-6   Drug use: no ROS:  GEN/ HEENT: No fever, significant weight changes sweats headaches vision problems hearing changes, CV/ PULM; No chest pain shortness of breath cough, syncope,edema  change in exercise tolerance. GI /GU: No adominal pain, vomiting, change in bowel habits. No blood in the stool. No significant GU symptoms. SKIN/HEME: ,no acute skin rashes suspicious lesions or bleeding. No lymphadenopathy, nodules, masses.  NEURO/ PSYCH:  No neurologic signs such as weakness numbness. No depression anxiety. IMM/ Allergy: No unusual infections.  Allergy .   REST of 12 system review negative except as per HPI   Past Medical History  Diagnosis Date  . Migraine   . Sarcoidosis     bronchoscopy 2006, mtx started 04/19/10 try off 11/29/10 (atypical cp/ ? worsening ct chest wlh) off prednisone 7/11  . Refusal of blood transfusions as patient is  Jehovah's Witness   . Nephrolithiasis     hx of episode 1/10 calcium stones  . ADD (attention deficit disorder with hyperactivity)     formal evaluation  . OSA (obstructive sleep apnea)     mild, Apnealink 7 2011 RDI7, O2 nadir 83%  . 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 MIGRAINE HEADACHE 08/30/2007    Qualifier: Diagnosis of  By: Scherrie Gerlach      Past Surgical History  Procedure Laterality Date  . Bronchoscopy  2006  . Knee surgery  1985 and 1986  . Kidney stone surgery    . Hernia repair      age 68    Family History  Problem Relation Age of Onset  . Stroke      Grandfather father older age  . Colon cancer Neg Hx   . Esophageal cancer Neg Hx   . Rectal cancer Neg Hx   . Stomach cancer Neg Hx   MGM long lived [place] age 30   History   Social History  . Marital Status: Married    Spouse Name: N/A  . Number of Children: N/A  . Years of Education: N/A   Social History Main Topics  . Smoking status: Never Smoker   . Smokeless tobacco: Never Used  . Alcohol Use: 4.8 oz/week    8 Glasses of wine per week  .  Drug Use: No  . Sexual Activity: Not on file   Other Topics Concern  . 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   Had to move ;last year and sell house because of financial difficulties in business.   Better now.    M in law died last year was in [facility] and wife caretaker.    Family in [place]   No tobacco or ETS    Outpatient Prescriptions Prior to Visit  Medication Sig Dispense Refill  . ADDERALL XR 30 MG 24 hr capsule Take 1 capsule (30 mg total) by mouth daily. 30 capsule 0  . losartan-hydrochlorothiazide (HYZAAR) 100-12.5 MG per tablet TAKE 1 TABLET BY MOUTH DAILY. AS DIRECTED 30 tablet 9   No facility-administered medications prior to visit.     EXAM:  BP 138/94 mmHg  Temp(Src) 98.4 F (36.9 C) (Oral)  Ht 5' 7.5" (1.715 m)  Wt 167 lb 4.8 oz (75.887 kg)  BMI  25.80 kg/m2  Body mass index is 25.8 kg/(m^2).  Physical Exam: Vital signs reviewed QJJ:HERD is a well-developed well-nourished alert cooperative    who appearsr stated age in no acute distress.  HEENT: normocephalic atraumatic , Eyes: PERRL EOM's full, conjunctiva clear, Nares: paten,t no deformity discharge or tenderness., Ears: no deformity EAC's clear TMs with normal landmarks. Mouth: clear OP, no lesions, edema.  Moist mucous membranes. Dentition in adequate repair. NECK: supple without masses, thyromegaly or bruits. CHEST/PULM:  Clear to auscultation and percussion breath sounds equal no wheeze , rales or rhonchi. No chest wall deformities or tenderness. CV: PMI is nondisplaced, S1 S2 no gallops, murmurs, rubs. Peripheral pulses are full without delay.No JVD.  ABDOMEN: Bowel sounds normal nontender  No guard or rebound, no hepato splenomegal no CVA tenderness.  No hernia. Extremtities:  No clubbing cyanosis or edema, no acute joint swelling or redness no focal atrophy NEURO:  Oriented x3, cranial nerves 3-12 appear to be intact, no obvious focal weakness,gait within normal limits no abnormal reflexes or asymmetrical SKIN: No acute rashes normal turgor, color, no bruising or petechiae. fe moles benign appearing  Hemangioma  abd . Nails  mid ridges .[name] PSYCH: Oriented, good eye contact, no obvious depression anxiety, cognition and judgment appear normal. LN: no cervical axillary inguinal adenopathy Has hernia .   ocass   Discomfort . Lab Results  Component Value Date   WBC 6.8 06/08/2015   HGB 15.7 06/08/2015   HCT 45.8 06/08/2015   PLT 292.0 06/08/2015   GLUCOSE 118* 06/08/2015   CHOL 196 06/08/2015   TRIG 79.0 06/08/2015   HDL 47.10 06/08/2015   LDLDIRECT 142.6 05/14/2013   LDLCALC 133* 06/08/2015   ALT 27 06/08/2015   AST 22 06/08/2015   NA 135 06/08/2015   K 3.9 06/08/2015   CL 101 06/08/2015   CREATININE 1.08 06/08/2015   BUN 19 06/08/2015   CO2 27 06/08/2015   TSH 0.87  06/08/2015   PSA 0.97 05/23/2014   INR 1.04 05/07/2010   BP Readings from Last 3 Encounters:  06/08/15 138/94  12/01/14 140/82  11/07/14 128/100     ASSESSMENT AND PLAN:  Discussed the following assessment and plan:  Visit for preventive health examination - Plan: Basic metabolic panel, CBC with Differential/Platelet, Hepatic function panel, Lipid panel, TSH  Essential hypertension - Plan: Basic metabolic panel, CBC with Differential/Platelet, Hepatic function panel, Lipid panel, TSH  Medication management -  Plan: Basic metabolic panel, CBC with Differential/Platelet, Hepatic function panel, Lipid panel, TSH  Attention deficit disorder - Plan: Basic metabolic panel, CBC with Differential/Platelet, Hepatic function panel, Lipid panel, TSH Declined hiv testing  PSA shared decision making. No sx csn  repeat in another year.  Cuticle picking .prob cause of nail issue   Consider see derm but not compelling need . Patient Care Team: Burnis Medin, MD as PCP - General Chesley Mires, MD (Pulmonary Disease) Patient Instructions   Do not know why you need monthly PA for your med  Writing  90 day supply and generic to see if that goes through.  If not can return the rx and rewwrite .   Please monitor blood pressure readings  1 week /month to ensure that   blood pressure is in control.  Will notify you  of labs when available.  Healthy lifestyle includes : At least 150 minutes of exercise weeks  , weight at healthy levels, which is usually   BMI 19-25. Avoid trans fats and processed foods;  Increase fresh fruits and veges to 5 servings per day. And avoid sweet beverages including tea and juice. Mediterranean diet with olive oil and nuts have been noted to be heart and brain healthy . Avoid tobacco products . Limit  alcohol to  7 per week for women and 14 servings for men.  Get adequate sleep . Wear seat belts . Don't text and drive .       Standley Brooking. Panosh M.D.

## 2015-06-08 NOTE — Patient Instructions (Signed)
   Do not know why you need monthly PA for your med  Writing  90 day supply and generic to see if that goes through.  If not can return the rx and rewwrite .   Please monitor blood pressure readings  1 week /month to ensure that   blood pressure is in control.  Will notify you  of labs when available.  Healthy lifestyle includes : At least 150 minutes of exercise weeks  , weight at healthy levels, which is usually   BMI 19-25. Avoid trans fats and processed foods;  Increase fresh fruits and veges to 5 servings per day. And avoid sweet beverages including tea and juice. Mediterranean diet with olive oil and nuts have been noted to be heart and brain healthy . Avoid tobacco products . Limit  alcohol to  7 per week for women and 14 servings for men.  Get adequate sleep . Wear seat belts . Don't text and drive .

## 2015-06-30 ENCOUNTER — Telehealth: Payer: Self-pay | Admitting: Family Medicine

## 2015-06-30 NOTE — Telephone Encounter (Signed)
Pt came into the office today.  Complains of poison ivy.  Found to be on his eyelid, chin, neck and arms.  Pt was concerned that it is on his eyelid.  Spoke to Dr. Sarajane Jews who stated get benadryl cream OTC and place on rash and can place on lid along with ice compress.  Pt was notified that one of our providers would be more than happy to see him on 07/01/15 for further evaluation.  Pt declined to make an appt at this time.  Stated he may go to an urgent care or would call in tomorrow to make an appt.  Advised the pt to call first thing in the morning if wanted to be seen.  Vitals checked before the pt was released.  BP: 122/90 Temp:  97.4 Temporal O2: 98% HR: 108

## 2015-07-23 ENCOUNTER — Telehealth: Payer: Self-pay | Admitting: Internal Medicine

## 2015-07-23 NOTE — Telephone Encounter (Signed)
Pt received 31 pills on 06/23/15.  See report on your desk.

## 2015-07-23 NOTE — Telephone Encounter (Signed)
Patient called needing a RX refill on Adderall XR 30 mg. Please give patient a call when its ready.

## 2015-07-24 MED ORDER — ADDERALL XR 30 MG PO CP24: 30.0000 mg | ORAL_CAPSULE | Freq: Every day | ORAL | Status: DC

## 2015-07-24 NOTE — Telephone Encounter (Signed)
Left a message for pt that rx is ready for pick up.

## 2015-07-24 NOTE — Telephone Encounter (Signed)
Ok to do adderall xr 31 x 3

## 2015-08-18 ENCOUNTER — Emergency Department (HOSPITAL_COMMUNITY): Payer: 59

## 2015-08-18 ENCOUNTER — Encounter (HOSPITAL_COMMUNITY): Payer: Self-pay | Admitting: *Deleted

## 2015-08-18 ENCOUNTER — Emergency Department (HOSPITAL_COMMUNITY)
Admission: EM | Admit: 2015-08-18 | Discharge: 2015-08-18 | Disposition: A | Discharge status: Home / Self Care | Payer: 59 | Attending: Emergency Medicine | Admitting: Emergency Medicine

## 2015-08-18 DIAGNOSIS — Z8679 Personal history of other diseases of the circulatory system: Secondary | ICD-10-CM | POA: Insufficient documentation

## 2015-08-18 DIAGNOSIS — Z79899 Other long term (current) drug therapy: Secondary | ICD-10-CM | POA: Diagnosis not present

## 2015-08-18 DIAGNOSIS — F909 Attention-deficit hyperactivity disorder, unspecified type: Secondary | ICD-10-CM | POA: Insufficient documentation

## 2015-08-18 DIAGNOSIS — Z87828 Personal history of other (healed) physical injury and trauma: Secondary | ICD-10-CM | POA: Diagnosis not present

## 2015-08-18 DIAGNOSIS — Z8669 Personal history of other diseases of the nervous system and sense organs: Secondary | ICD-10-CM | POA: Diagnosis not present

## 2015-08-18 DIAGNOSIS — R110 Nausea: Secondary | ICD-10-CM

## 2015-08-18 DIAGNOSIS — N200 Calculus of kidney: Secondary | ICD-10-CM

## 2015-08-18 DIAGNOSIS — R109 Unspecified abdominal pain: Secondary | ICD-10-CM | POA: Diagnosis present

## 2015-08-18 DIAGNOSIS — Z862 Personal history of diseases of the blood and blood-forming organs and certain disorders involving the immune mechanism: Secondary | ICD-10-CM | POA: Insufficient documentation

## 2015-08-18 LAB — URINE MICROSCOPIC-ADD ON

## 2015-08-18 LAB — BASIC METABOLIC PANEL
Anion gap: 7 (ref 5–15)
BUN: 16 mg/dL (ref 6–20)
CALCIUM: 9 mg/dL (ref 8.9–10.3)
CO2: 28 mmol/L (ref 22–32)
Chloride: 103 mmol/L (ref 101–111)
Creatinine, Ser: 0.92 mg/dL (ref 0.61–1.24)
GFR calc non Af Amer: >60 mL/min (ref >60)
Glucose, Bld: 109 mg/dL — ABNORMAL HIGH (ref 65–99)
Potassium: 3.7 mmol/L (ref 3.5–5.1)
Sodium: 138 mmol/L (ref 135–145)

## 2015-08-18 LAB — URINALYSIS, ROUTINE W REFLEX MICROSCOPIC
Bilirubin Urine: NEGATIVE
Glucose, UA: NEGATIVE mg/dL
Ketones, ur: NEGATIVE mg/dL
Leukocytes, UA: NEGATIVE
NITRITE: NEGATIVE
Protein, ur: NEGATIVE mg/dL
Specific Gravity, Urine: 1.03 (ref 1.005–1.030)
Urobilinogen, UA: 0.2 mg/dL (ref 0.0–1.0)
pH: 6 (ref 5.0–8.0)

## 2015-08-18 LAB — CBC WITH DIFFERENTIAL/PLATELET
BASOS ABS: 0 10*3/uL (ref 0.0–0.1)
Basophils Relative: 0 % (ref 0–1)
EOS PCT: 2 % (ref 0–5)
Eosinophils Absolute: 0.2 10*3/uL (ref 0.0–0.7)
HEMATOCRIT: 46.8 % (ref 39.0–52.0)
Hemoglobin: 16.4 g/dL (ref 13.0–17.0)
Lymphocytes Relative: 19 % (ref 12–46)
Lymphs Abs: 1.4 10*3/uL (ref 0.7–4.0)
MCH: 31.1 pg (ref 26.0–34.0)
MCHC: 35 g/dL (ref 30.0–36.0)
MCV: 88.8 fL (ref 78.0–100.0)
MONO ABS: 1.2 10*3/uL — AB (ref 0.1–1.0)
Monocytes Relative: 16 % — ABNORMAL HIGH (ref 3–12)
Neutro Abs: 4.6 10*3/uL (ref 1.7–7.7)
Neutrophils Relative %: 63 % (ref 43–77)
PLATELETS: 274 10*3/uL (ref 150–400)
RBC: 5.27 MIL/uL (ref 4.22–5.81)
RDW: 12.8 % (ref 11.5–15.5)
WBC: 7.3 10*3/uL (ref 4.0–10.5)

## 2015-08-18 MED ORDER — HYDROMORPHONE HCL 1 MG/ML IJ SOLN
1.0000 mg | Freq: Once | INTRAMUSCULAR | Status: AC
  Administered 2015-08-18: 1 mg via INTRAVENOUS
  Filled 2015-08-18: qty 1

## 2015-08-18 MED ORDER — ONDANSETRON HCL 4 MG/2ML IJ SOLN
4.0000 mg | Freq: Once | INTRAMUSCULAR | Status: AC
  Administered 2015-08-18: 4 mg via INTRAVENOUS
  Filled 2015-08-18: qty 2

## 2015-08-18 MED ORDER — OXYCODONE-ACETAMINOPHEN 5-325 MG PO TABS: 1.0000 | ORAL_TABLET | ORAL | Status: DC | PRN

## 2015-08-18 MED ORDER — MORPHINE SULFATE (PF) 4 MG/ML IV SOLN
4.0000 mg | Freq: Once | INTRAVENOUS | Status: DC
  Filled 2015-08-18: qty 1

## 2015-08-18 MED ORDER — TAMSULOSIN HCL 0.4 MG PO CAPS: 0.4000 mg | ORAL_CAPSULE | Freq: Every day | ORAL | Status: DC

## 2015-08-18 MED ORDER — ONDANSETRON 4 MG PO TBDP: 4.0000 mg | ORAL_TABLET | Freq: Three times a day (TID) | ORAL | Status: DC | PRN

## 2015-08-18 MED ORDER — KETOROLAC TROMETHAMINE 30 MG/ML IJ SOLN
30.0000 mg | Freq: Once | INTRAMUSCULAR | Status: AC
  Administered 2015-08-18: 30 mg via INTRAVENOUS
  Filled 2015-08-18: qty 1

## 2015-08-18 NOTE — ED Notes (Signed)
Patient transported to CT 

## 2015-08-18 NOTE — ED Notes (Signed)
Pt states that he has a history of kidney stones and began hurting this am around 2am; pt states that the pain is mostly on the left side but is having some right sided flank pain as well; pt states that the pain radiates to his testicles; pt denies difficulty urinating or blood to his urine at this point

## 2015-08-18 NOTE — ED Provider Notes (Signed)
CSN: [id number]     Arrival date & time 08/18/15  0613 History   First MD Initiated Contact with Patient 08/18/15 [phone]     Chief Complaint  Patient presents with  . Flank Pain     (Consider location/radiation/quality/duration/timing/severity/associated sxs/prior Treatment) Patient is a 54 y.o. male presenting with flank pain. The history is provided by the patient and medical records.  Flank Pain Associated symptoms include nausea.     This is a 54 year old male with history of migraines, sarcoidosis, ADD, sleep apnea, kidney stones, presenting to the ED for left flank pain. Patient states he began having pain around 0200 this morning. He states the pain woke him from sleep. Pain is sharp with radiation to his groin.  He denies frank testicle pain. He states he has since began having some pain in his right flank as well. He reports nausea, no vomiting [place] far. No fever or chills. He denies urinary symptoms or hematuria. Patient has long-standing history of kidney stones, last episode was several years ago. He is not followed by urology regularly. He has required stone retrieval once in the past.  VSS.  Past Medical History  Diagnosis Date  . Migraine   . Sarcoidosis     bronchoscopy 2006, mtx started 04/19/10 try off 11/29/10 (atypical cp/ ? worsening ct chest wlh) off prednisone 7/11  . Refusal of blood transfusions as patient is Jehovah's Witness   . Nephrolithiasis     hx of episode 1/10 calcium stones  . ADD (attention deficit disorder with hyperactivity)     formal evaluation  . OSA (obstructive sleep apnea)     mild, Apnealink 7 2011 RDI7, O2 nadir 83%  . 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 MIGRAINE HEADACHE 08/30/2007    Qualifier: Diagnosis of  By: Scherrie Gerlach     Past Surgical History  Procedure Laterality Date  . Bronchoscopy  2006  . Knee surgery  1985 and 1986  . Kidney stone surgery    . Hernia repair      age 70   Family History  Problem  Relation Age of Onset  . Stroke      Grandfather father older age  . Colon cancer Neg Hx   . Esophageal cancer Neg Hx   . Rectal cancer Neg Hx   . Stomach cancer Neg Hx    Social History  Substance Use Topics  . Smoking status: Never Smoker   . Smokeless tobacco: Never Used  . Alcohol Use: 4.8 oz/week    8 Glasses of wine per week     Comment: socially    Review of Systems  Gastrointestinal: Positive for nausea.  Genitourinary: Positive for flank pain.  All other systems reviewed and are negative.     Allergies  Morphine and related  Home Medications   Prior to Admission medications   Medication Sig Start Date End Date Taking? Authorizing Provider  ADDERALL XR 30 MG 24 hr capsule Take 1 capsule (30 mg total) by mouth daily. Fill last 07/24/15  Yes Burnis Medin, MD  losartan-hydrochlorothiazide (HYZAAR) 100-12.5 MG per tablet TAKE 1 TABLET BY MOUTH DAILY. AS DIRECTED 08/01/14  Yes Burnis Medin, MD  ADDERALL XR 30 MG 24 hr capsule Take 1 capsule (30 mg total) by mouth daily. Fill second Patient not taking: Reported on 08/18/2015 07/24/15   Burnis Medin, MD   BP 148/93 mmHg  Pulse 71  Temp(Src) 98.1 F (36.7  C) (Oral)  Resp 18  Ht 5' 7.75" (1.721 m)  Wt 165 lb (74.844 kg)  BMI 25.27 kg/m2  SpO2 100%   Physical Exam  Constitutional: He is oriented to person, place, and time. He appears well-developed and well-nourished. No distress.  HENT:  Head: Normocephalic and atraumatic.  Mouth/Throat: Oropharynx is clear and moist.  Eyes: Conjunctivae and EOM are normal. Pupils are equal, round, and reactive to light.  Neck: Normal range of motion. Neck supple.  Cardiovascular: Normal rate, regular rhythm and normal heart sounds.   Pulmonary/Chest: Effort normal and breath sounds normal. No respiratory distress. He has no wheezes.  Abdominal: Soft. Bowel sounds are normal. There is no tenderness. There is no guarding and no CVA tenderness.  Musculoskeletal: Normal range of  motion. He exhibits no edema.  Neurological: He is alert and oriented to person, place, and time.  Skin: Skin is warm and dry. He is not diaphoretic.  Psychiatric: He has a normal mood and affect.  Nursing note and vitals reviewed.   ED Course  Procedures (including critical care time) Labs Review Labs Reviewed  CBC WITH DIFFERENTIAL/PLATELET - Abnormal; Notable for the following:    Monocytes Relative 16 (*)    Monocytes Absolute 1.2 (*)    All other components within normal limits  BASIC METABOLIC PANEL - Abnormal; Notable for the following:    Glucose, Bld 109 (*)    All other components within normal limits  URINALYSIS, ROUTINE W REFLEX MICROSCOPIC (NOT AT [facility]) - Abnormal; Notable for the following:    Hgb urine dipstick SMALL (*)    All other components within normal limits  URINE MICROSCOPIC-ADD ON - Abnormal; Notable for the following:    Crystals CA OXALATE CRYSTALS (*)    All other components within normal limits    Imaging Review Ct Renal Stone Study  08/18/2015   CLINICAL DATA:  Left flank pain  EXAM: CT ABDOMEN AND PELVIS WITHOUT CONTRAST  TECHNIQUE: Multidetector CT imaging of the abdomen and pelvis was performed following the standard protocol without IV contrast.  COMPARISON:  CT abdomen pelvis 05/10/2010  FINDINGS: Lower chest: Branching densities left lower lobe posteriorly stable. These may represent mucus filled airways. No obstructing mass is seen. Right lung bases clear. No pleural effusion  Hepatobiliary: Normal liver without mass lesion. Gallbladder and bile ducts normal.  Pancreas: Negative  Spleen: Negative  Adrenals/Urinary Tract: 2 mm nonobstructing stone left midpole is unchanged. Additional 2 mm nonobstructing stone left mid kidney. No other renal calculi. No obstruction or mass. Normal adrenal glands. Bladder is empty without focal abnormality.  Stomach/Bowel: Negative for bowel obstruction. No bowel thickening or edema. Normal appendix.  Vascular/Lymphatic:  Negative  Reproductive: Mild prostate enlargement.  Other: Negative for free fluid.  Negative for mass or adenopathy.  Musculoskeletal: Schmorl's node superior endplate L2. No acute bony abnormality. Left hip compression screw and plate.  IMPRESSION: Small nonobstructing left renal calculi.  No acute abnormality.   Electronically Signed   By: Franchot Gallo M.D.   On: 08/18/2015 07:26   I have personally reviewed and evaluated these images and lab results as part of my medical decision-making.   EKG Interpretation None      MDM   Final diagnoses:  Flank pain  Nephrolithiasis  Nausea   History-year-old male here with left flank pain and nausea. History of kidney stones in the past with similar symptoms. Patient is afebrile, nontoxic. No reproducible CVA tenderness. Lab work as above. CT with nonobstructing left  renal calculi, no ureteral stones noted. Patient was treated with Toradol, Dilaudid, and Zofran with adequate resolution of symptoms. Will discharge home with urology follow-up.  Patient given urine strainer-- will monitor for passage of stone.  Rx percocet, zofran, flomax.  Discussed plan with patient, he/she acknowledged understanding and agreed with plan of care.  Return precautions given for new or worsening symptoms.  Larene Pickett, PA-C 08/18/15 8032  Everlene Balls, MD 08/18/15 [phone]

## 2015-08-18 NOTE — Discharge Instructions (Signed)
Take the prescribed medication as directed. Follow-up with [facility] if problems occur. Return to the ED for new or worsening symptoms.

## 2015-08-25 ENCOUNTER — Telehealth: Payer: Self-pay | Admitting: Internal Medicine

## 2015-08-25 ENCOUNTER — Telehealth: Payer: Self-pay | Admitting: *Deleted

## 2015-08-25 DIAGNOSIS — N200 Calculus of kidney: Secondary | ICD-10-CM

## 2015-08-25 NOTE — Telephone Encounter (Signed)
Pt wife is here in office and wants the patient to have a referral to a Urologist this week. A call to the Triage RN was made this morning about he patient and his kidney stones and his recent ER visit. Patient wife is requesting a call back about the status of the referral . Pt wife # [phone].

## 2015-08-25 NOTE — Telephone Encounter (Signed)
Urology consult  asap

## 2015-08-25 NOTE — Telephone Encounter (Signed)
Margaretha Sheffield (wife) notified that referral has been placed.

## 2015-08-25 NOTE — Telephone Encounter (Signed)
PLEASE NOTE: All timestamps contained within this report are represented as [place] Standard Time. CONFIDENTIALTY NOTICE: This fax transmission is intended only for the addressee. It contains information that is legally privileged, confidential or otherwise protected from use or disclosure. If you are not the intended recipient, you are strictly prohibited from reviewing, disclosing, copying using or disseminating any of this information or taking any action in reliance on or regarding this information. If you have received this fax in error, please notify us immediately by telephone so that we can arrange for its return to [place]. Phone: [phone], Toll-Free: [phone], Fax: [phone] Page: 1 of 1 Call Id: [id number] [facility] [place] Patient Name: Barry Silva DOB: 29-Oct-1961 Initial Comment Caller states her husband has some kidney stones. Was in ER last week and they gave him pain meds and other Rxs. Still not passing them. He is sleeping all day and in pain. Nurse Assessment Nurse: Malva Cogan, RN, Juliann Pulse Date/Time Eilene Ghazi Time): 08/25/2015 3:17:57 PM Confirm and document reason for call. If symptomatic, describe symptoms. ---Caller states that she is not currently with her sp but is on her way home now to check on her sp. Caller states that her sp was seen in ED late Friday night or early Saturday AM & diagnosed with kidney stones, was prescribed pain meds & other meds but still has not passed the stones & is still having pain but does have pain meds available. Advised that she needs to call use back when she is with her sp so that his symptoms can be addressed. Has the patient traveled out of the country within the last 30 days? ---No Does the patient require triage? ---No Please document clinical information provided and list any resource used. ---See previous note. Guidelines Guideline Title  Affirmed Question Affirmed Notes Final Disposition User Clinical Call Malva Cogan, RN, Juliann Pulse Comments Person who answered phone advised triager that triager has incorrect number, will have lead rep to verify number. GOLDAN, lead rep, advised to verify number. Number verified, correct number is [phone]

## 2015-08-25 NOTE — Telephone Encounter (Signed)
Urology referral placed STAT in the system.

## 2015-08-25 NOTE — Telephone Encounter (Signed)
Ok to do urology referral   As requested   Dx acute renal stones

## 2015-08-31 ENCOUNTER — Encounter: Payer: Self-pay | Admitting: Adult Health

## 2015-08-31 ENCOUNTER — Ambulatory Visit (INDEPENDENT_AMBULATORY_CARE_PROVIDER_SITE_OTHER): Payer: 59 | Admitting: Adult Health

## 2015-08-31 VITALS — BP 120/80 | Temp 98.2°F | Ht 67.75 in | Wt 168.3 lb

## 2015-08-31 DIAGNOSIS — R1033 Periumbilical pain: Secondary | ICD-10-CM

## 2015-08-31 LAB — CBC WITH DIFFERENTIAL/PLATELET
BASOS PCT: 0.7 % (ref 0.0–3.0)
Basophils Absolute: 0 10*3/uL (ref 0.0–0.1)
EOS ABS: 0.1 10*3/uL (ref 0.0–0.7)
Eosinophils Relative: 0.9 % (ref 0.0–5.0)
HCT: 48.7 % (ref 39.0–52.0)
Hemoglobin: 16.8 g/dL (ref 13.0–17.0)
LYMPHS ABS: 1.1 10*3/uL (ref 0.7–4.0)
Lymphocytes Relative: 17.7 % (ref 12.0–46.0)
MCHC: 34.5 g/dL (ref 30.0–36.0)
MCV: 89 fl (ref 78.0–100.0)
MONO ABS: 0.7 10*3/uL (ref 0.1–1.0)
Monocytes Relative: 11.3 % (ref 3.0–12.0)
NEUTROS ABS: 4.5 10*3/uL (ref 1.4–7.7)
NEUTROS PCT: 69.4 % (ref 43.0–77.0)
PLATELETS: 322 10*3/uL (ref 150.0–400.0)
RBC: 5.47 Mil/uL (ref 4.22–5.81)
RDW: 12.6 % (ref 11.5–15.5)
WBC: 6.5 10*3/uL (ref 4.0–10.5)

## 2015-08-31 LAB — POCT URINALYSIS DIPSTICK
Bilirubin, UA: NEGATIVE
GLUCOSE UA: NEGATIVE
KETONES UA: NEGATIVE
Leukocytes, UA: NEGATIVE
Nitrite, UA: NEGATIVE
Protein, UA: NEGATIVE
SPEC GRAV UA: 1.02
UROBILINOGEN UA: 0.2
pH, UA: 7

## 2015-08-31 MED ORDER — ADDERALL XR 30 MG PO CP24: 30.0000 mg | ORAL_CAPSULE | Freq: Every day | ORAL | Status: DC

## 2015-08-31 NOTE — Progress Notes (Signed)
Subjective:    Patient ID: Barry Silva, male    DOB: 1961/07/04, 54 y.o.   MRN: [id number]  HPI  54 year old male who presents to the office today after being seen in the ER on 08/18/2015, per ER note: "presenting to the ED for left flank pain. Patient states he began having pain around 0200 this morning. He states the pain woke him from sleep. Pain is sharp with radiation to his groin. He denies frank testicle pain. He states he has since began having some pain in his right flank as well. He reports nausea, no vomiting [place] far. No fever or chills. He denies urinary symptoms or hematuria. Patient has long-standing history of kidney stones, last episode was several years ago. He is not followed by urology regularly. He has required stone retrieval once in the past."  CT was done in the ER with showed. Small nonobstructing left renal calculi. Urinalysis + for blood. Microscopic + for CA Oxalate Crystals.   He followed up with [facility] on September 7 , at that time he continued to have intermittent abdominal/ mid back pain that was bilateral in nature but mostly left-sided. His symptoms were slightly less severe. Urology was not convinced that his pain was from his nonobstructing left renal calculi and that his symptoms were related to GI issues. It was recommended that he reduce the amount of narcotic pain medication and continue to monitor his symptoms. If he continued to have symptoms to follow-up with his PCP for further evaluation.  Barry Silva presents to the office today for further evaluation due to continuing intermittent abdominal discomfort, that he reports has been getting better over time. He is still concerned that he continues to have abdominal discomfort. Denies any nausea vomiting or fevers. Has had one or 2 instances of diarrhea. His last instance of this abdominal discomfort was this morning after he woken up, currently in the office he has no complaints of abdominal  pain or discomfort. He does have an additional complaint of fatigue, he endorses that this could possibly be from sleep disturbance due to his abdominal discomfort. He has had to wake up around 3 AM some early mornings and take Percocet, which has caused him to go back to bed feeling drowsy when he wakes up.  He is wondering if his abdominal discomfort is due to a gluten allergy, and he has decided that he would like to quit eating gluten to see if this helps with his abdominal discomfort.      Review of Systems  Constitutional: Negative.   Gastrointestinal: Positive for abdominal pain and diarrhea. Negative for nausea, vomiting, constipation, blood in stool, abdominal distention and rectal pain.  Genitourinary: Negative.   Musculoskeletal: Negative.   Neurological: Negative.   Psychiatric/Behavioral: Positive for sleep disturbance.  All other systems reviewed and are negative.  Past Medical History  Diagnosis Date  . Migraine   . Sarcoidosis     bronchoscopy 2006, mtx started 04/19/10 try off 11/29/10 (atypical cp/ ? worsening ct chest wlh) off prednisone 7/11  . Refusal of blood transfusions as patient is Jehovah's Witness   . Nephrolithiasis     hx of episode 1/10 calcium stones  . ADD (attention deficit disorder with hyperactivity)     formal evaluation  . OSA (obstructive sleep apnea)     mild, Apnealink 7 2011 RDI7, O2 nadir 83%  . 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 MIGRAINE HEADACHE  08/30/2007    Qualifier: Diagnosis of  By: Scherrie Gerlach      Social History   Social History  . Marital Status: Married    Spouse Name: N/A  . Number of Children: N/A  . Years of Education: N/A   Occupational History  . Not on file.   Social History Main Topics  . Smoking status: Never Smoker   . Smokeless tobacco: Never Used  . Alcohol Use: 4.8 oz/week    8 Glasses of wine per week     Comment: socially  . Drug Use: No  . Sexual Activity: Not on file    Other Topics Concern  . 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   Had to move ;last year and sell house because of financial difficulties in business.   Better now.    M in law died last year was in [facility] and wife caretaker.    Family in [place]   No tobacco or ETS    Past Surgical History  Procedure Laterality Date  . Bronchoscopy  2006  . Knee surgery  1985 and 1986  . Kidney stone surgery    . Hernia repair      age 56    Family History  Problem Relation Age of Onset  . Stroke      Grandfather father older age  . Colon cancer Neg Hx   . Esophageal cancer Neg Hx   . Rectal cancer Neg Hx   . Stomach cancer Neg Hx     Allergies  Allergen Reactions  . Morphine And Related Nausea Only    Current Outpatient Prescriptions on File Prior to Visit  Medication Sig Dispense Refill  . ADDERALL XR 30 MG 24 hr capsule Take 1 capsule (30 mg total) by mouth daily. Fill second 31 capsule 0  . losartan-hydrochlorothiazide (HYZAAR) 100-12.5 MG per tablet TAKE 1 TABLET BY MOUTH DAILY. AS DIRECTED 30 tablet 9  . ondansetron (ZOFRAN ODT) 4 MG disintegrating tablet Take 1 tablet (4 mg total) by mouth every 8 (eight) hours as needed for nausea. (Patient not taking: Reported on 08/31/2015) 10 tablet 0  . oxyCODONE-acetaminophen (PERCOCET/ROXICET) 5-325 MG per tablet Take 1 tablet by mouth every 4 (four) hours as needed. (Patient not taking: Reported on 08/31/2015) 15 tablet 0  . tamsulosin (FLOMAX) 0.4 MG CAPS capsule Take 1 capsule (0.4 mg total) by mouth daily after supper. (Patient not taking: Reported on 08/31/2015) 10 capsule 0   No current facility-administered medications on file prior to visit.    BP 120/80 mmHg  Temp(Src) 98.2 F (36.8 C) (Oral)  Ht 5' 7.75" (1.721 m)  Wt 168 lb 4.8 oz (76.34 kg)  BMI 25.77 kg/m2       Objective:   Physical Exam  Constitutional: He is oriented to person, place, and time. He  appears well-developed and well-nourished. No distress.  Cardiovascular: Normal rate, regular rhythm, normal heart sounds and intact distal pulses.  Exam reveals no gallop and no friction rub.   No murmur heard. Pulmonary/Chest: Effort normal and breath sounds normal. No respiratory distress. He has no wheezes. He has no rales. He exhibits no tenderness.  Abdominal: Soft. Bowel sounds are normal. He exhibits no distension and no mass. There is no tenderness. There is no rebound and no guarding.  Musculoskeletal: Normal range of motion.  Lymphadenopathy:    He  has no cervical adenopathy.  Neurological: He is alert and oriented to person, place, and time.  Skin: Skin is warm and dry. No rash noted. He is not diaphoretic. No erythema. No pallor.  Psychiatric: He has a normal mood and affect. His behavior is normal. Judgment and thought content normal.  Nursing note and vitals reviewed.     Assessment & Plan:  1. Periumbilical abdominal pain - Unknown etiology at this point, likely GI in nature. Cannot r/o glutin allergy, GERD/peptic ulcer.  - POCT urinalysis dipstick - Celiac Panel - CBC with Differential  - He will d/c eating glutin.  - Would like to wait on Protonix - Will consider GI consult and Protonix if no improvement.

## 2015-08-31 NOTE — Patient Instructions (Signed)
It was great meeting you today!  I will follow up with you regarding your labs.   Cut out wheat and grain products to see if this helps.   If you do not notice any improvement in the next few days, then please follow up.

## 2015-09-01 ENCOUNTER — Telehealth: Payer: Self-pay | Admitting: Adult Health

## 2015-09-01 LAB — GLIA (IGA/G) + TTG IGA
GLIADIN IGA: 4 U (ref <20)
GLIADIN IGG: 3 U (ref <20)
Tissue Transglutaminase Ab, IgA: 1 U/mL (ref <4)

## 2015-09-01 NOTE — Telephone Encounter (Signed)
Spoke to patient on the phone and informed him of his lab results. He endorses feeling better today than he did yesterday

## 2015-09-04 ENCOUNTER — Telehealth: Payer: Self-pay | Admitting: Internal Medicine

## 2015-09-04 NOTE — Telephone Encounter (Signed)
Pt is asking about his gluten results.Barry Silva

## 2015-09-04 NOTE — Telephone Encounter (Signed)
Left message on machine for patient to return our call 

## 2015-09-04 NOTE — Telephone Encounter (Signed)
Results were negative, did not show a gluten allergy.

## 2015-09-08 NOTE — Telephone Encounter (Signed)
Left message on machine for patient to return our call 

## 2015-09-11 NOTE — Telephone Encounter (Signed)
Patient is aware of lab result. 

## 2015-09-29 ENCOUNTER — Telehealth: Payer: Self-pay | Admitting: Internal Medicine

## 2015-09-29 NOTE — Telephone Encounter (Signed)
Pt needs refill on ADDERALL XR 30 MG 24 hr capsule

## 2015-09-30 MED ORDER — ADDERALL XR 30 MG PO CP24: 30.0000 mg | ORAL_CAPSULE | Freq: Every day | ORAL | Status: DC

## 2015-09-30 MED ORDER — AMPHETAMINE-DEXTROAMPHET ER 30 MG PO CP24: 30.0000 mg | ORAL_CAPSULE | Freq: Every day | ORAL | Status: DC

## 2015-09-30 NOTE — Telephone Encounter (Signed)
Pt notified to pick up at the front desk.  Has appt scheduled for 12/15/15

## 2015-10-01 ENCOUNTER — Other Ambulatory Visit: Payer: Self-pay | Admitting: Internal Medicine

## 2015-10-02 NOTE — Telephone Encounter (Signed)
Sent to the pharmacy by e-scribe. 

## 2015-10-05 ENCOUNTER — Other Ambulatory Visit: Payer: Self-pay

## 2015-10-05 MED ORDER — LOSARTAN POTASSIUM-HCTZ 100-12.5 MG PO TABS: 1.0000 | ORAL_TABLET | Freq: Every day | ORAL | Status: DC

## 2015-10-27 ENCOUNTER — Telehealth: Payer: Self-pay | Admitting: Internal Medicine

## 2015-10-27 NOTE — Telephone Encounter (Signed)
° ° ° ° ° °

## 2015-10-28 MED ORDER — ADDERALL XR 30 MG PO CP24: 30.0000 mg | ORAL_CAPSULE | Freq: Every day | ORAL | Status: DC

## 2015-10-28 NOTE — Telephone Encounter (Signed)
Pt notified to pick up at the front desk. 

## 2015-11-02 ENCOUNTER — Ambulatory Visit (INDEPENDENT_AMBULATORY_CARE_PROVIDER_SITE_OTHER): Payer: 59 | Admitting: Internal Medicine

## 2015-11-02 ENCOUNTER — Encounter: Payer: Self-pay | Admitting: Internal Medicine

## 2015-11-02 ENCOUNTER — Telehealth: Payer: Self-pay

## 2015-11-02 VITALS — BP 140/98 | HR 98 | Temp 98.4°F | Wt 171.2 lb

## 2015-11-02 DIAGNOSIS — L237 Allergic contact dermatitis due to plants, except food: Secondary | ICD-10-CM | POA: Diagnosis not present

## 2015-11-02 MED ORDER — PREDNISONE 10 MG PO TABS: ORAL_TABLET | ORAL | Status: DC

## 2015-11-02 NOTE — Patient Instructions (Signed)
Can restart 12 day course and taper accordingly can wean faster than 12 days if needed. Agree with measures for future prevention

## 2015-11-02 NOTE — Progress Notes (Signed)
Pre visit review using our clinic review tool, if applicable. No additional management support is needed unless otherwise documented below in the visit note.   Chief Complaint  Patient presents with  . Poison Ivy    Seen in urgent care.  Given prednisone but feeling worse.    HPI: Patient Barry Silva  comes in today for SDA for  new problem evaluation. triag message   About 2 weeks ago cut down some trees ( job related?)   Exposed to [place] vines and thought had taken precautinos but then broke out all 4 extremitis and gu area neck trunk  Give  5-7 days pred via urgent care ( after ahours)  Improved but when ran out 11 11  Has increased areas again and some new spots   Itching no fever  Pt states that he was cutting trees 2 saturdays ago and 2-3 day after he develop rash (poison Ivy). Pt states that he went to an urgent care and they prescribe him Prednisone 5mg  pt felt better at the beginning but now the rash got worse. i make an appt with Dr Shelbie Hutching today at 2:15           ROS: See pertinent positives and negatives per HPI. Hx of PI  Past Medical History  Diagnosis Date  . Migraine   . Sarcoidosis     bronchoscopy 2006, mtx started 04/19/10 try off 11/29/10 (atypical cp/ ? worsening ct chest wlh) off prednisone 7/11  . Refusal of blood transfusions as patient is Jehovah's Witness   . Nephrolithiasis     hx of episode 1/10 calcium stones  . ADD (attention deficit disorder with hyperactivity)     formal evaluation  . OSA (obstructive sleep apnea)     mild, Apnealink 7 2011 RDI7, O2 nadir 83%  . 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 MIGRAINE HEADACHE 08/30/2007    Qualifier: Diagnosis of  By: Sherren Mocha RN, Dorian Pod      Family History  Problem Relation Age of Onset  . Stroke      Grandfather father older age  . Colon cancer Neg Hx   . Esophageal cancer Neg Hx   . Rectal cancer Neg Hx   . Stomach cancer Neg Hx     Social History   Social History  . Marital  Status: Married    Spouse Name: N/A  . Number of Children: N/A  . Years of Education: N/A   Social History Main Topics  . Smoking status: Never Smoker   . Smokeless tobacco: Never Used  . Alcohol Use: 4.8 oz/week    8 Glasses of wine per week     Comment: socially  . Drug Use: No  . Sexual Activity: Not on file   Other Topics Concern  . 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   Had to move ;last year and sell house because of financial difficulties in business.   Better now.    M in law died last year was in [facility] and wife caretaker.    Family in [place]   No tobacco or ETS    Outpatient Prescriptions Prior to Visit  Medication Sig Dispense Refill  . ADDERALL XR 30 MG 24 hr capsule Take 1 capsule (30 mg total) by mouth daily. Fill second 30 capsule 0  . ADDERALL XR  30 MG 24 hr capsule Take 1 capsule (30 mg total) by mouth daily. Fill last 30 capsule 0  . losartan-hydrochlorothiazide (HYZAAR) 100-12.5 MG tablet Take 1 tablet by mouth daily. 90 tablet 1  . tamsulosin (FLOMAX) 0.4 MG CAPS capsule Take 1 capsule (0.4 mg total) by mouth daily after supper. 10 capsule 0  . ondansetron (ZOFRAN ODT) 4 MG disintegrating tablet Take 1 tablet (4 mg total) by mouth every 8 (eight) hours as needed for nausea. (Patient not taking: Reported on 08/31/2015) 10 tablet 0  . oxyCODONE-acetaminophen (PERCOCET/ROXICET) 5-325 MG per tablet Take 1 tablet by mouth every 4 (four) hours as needed. (Patient not taking: Reported on 08/31/2015) 15 tablet 0   No facility-administered medications prior to visit.     EXAM:  BP 140/98 mmHg  Pulse 98  Temp(Src) 98.4 F (36.9 C) (Oral)  Wt 171 lb 3.2 oz (77.656 kg)  Body mass index is 26.22 kg/(m^2).  GENERAL: vitals reviewed and listed above, alert, oriented, appears well hydrated and in no acute distress   Skin nl cap refill CD  Typical of rhus dermatitis  Neck trunk waist  line and upper  arms     Legs PSYCH: pleasant and cooperative, no obvious depression or anxiety  ASSESSMENT AND PLAN:  Discussed the following assessment and plan:  Contact dermatitis due to poison ivy - hx of same rebound  continuation pt aware of avoidance  plans  on neck trunk  all extremities 12 day taper t  As directed  And avoidance as advised  -Patient advised to return or notify health care team  if symptoms worsen ,persist or new concerns arise.  Patient Instructions  Can restart 12 day course and taper accordingly can wean faster than 12 days if needed. Agree with measures for future prevention     Standley Brooking. Panosh M.D.

## 2015-11-02 NOTE — Telephone Encounter (Signed)
Pt states that he was cutting trees 2 saturdays ago and 2-3 day after he develop rash (poison Ivy). Pt states that he went to an urgent care and they prescribe him Prednisone 5mg  pt felt better at the beginning but now the rash got worse. i make an appt with Dr Shelbie Hutching today at 2:15

## 2015-12-01 ENCOUNTER — Telehealth: Payer: Self-pay | Admitting: Internal Medicine

## 2015-12-01 NOTE — Telephone Encounter (Signed)
Spoke to the pt.  He was given 2 prescriptions on 10/28/15.  Pt stated he forgot and no further action needed.

## 2015-12-01 NOTE — Telephone Encounter (Signed)
Pt called saying he needs a refill of Adderall. Please give him a call if you have questions or concerns.  Pt's ph# [phone] Thank you.

## 2015-12-15 ENCOUNTER — Encounter: Payer: 59 | Admitting: Internal Medicine

## 2015-12-15 NOTE — Progress Notes (Signed)
Document opened and reviewed for OV but appt  NS same day .   

## 2016-01-06 ENCOUNTER — Encounter: Payer: Self-pay | Admitting: Cardiology

## 2016-02-16 ENCOUNTER — Telehealth: Payer: Self-pay | Admitting: Internal Medicine

## 2016-02-16 NOTE — Telephone Encounter (Signed)
Pt needs new rx generic adderall xr 30 mg °

## 2016-02-17 NOTE — Telephone Encounter (Signed)
Pt has made an appointment for 02/19/16 @ 8:30.  Instructed to arrive at 8:15 AM.  He has enough medication to last until that appointment.

## 2016-02-19 ENCOUNTER — Encounter: Payer: Self-pay | Admitting: Internal Medicine

## 2016-02-19 ENCOUNTER — Ambulatory Visit (INDEPENDENT_AMBULATORY_CARE_PROVIDER_SITE_OTHER): Payer: BLUE CROSS/BLUE SHIELD | Admitting: Internal Medicine

## 2016-02-19 VITALS — BP 132/90 | Temp 97.8°F | Wt 171.4 lb

## 2016-02-19 DIAGNOSIS — F988 Other specified behavioral and emotional disorders with onset usually occurring in childhood and adolescence: Secondary | ICD-10-CM

## 2016-02-19 DIAGNOSIS — Z79899 Other long term (current) drug therapy: Secondary | ICD-10-CM

## 2016-02-19 DIAGNOSIS — F909 Attention-deficit hyperactivity disorder, unspecified type: Secondary | ICD-10-CM

## 2016-02-19 DIAGNOSIS — I10 Essential (primary) hypertension: Secondary | ICD-10-CM | POA: Diagnosis not present

## 2016-02-19 MED ORDER — ADDERALL XR 30 MG PO CP24: 30.0000 mg | ORAL_CAPSULE | Freq: Every day | ORAL | Status: DC

## 2016-02-19 NOTE — Patient Instructions (Signed)
Make sure BP  Is in range  . tox screen today . Wellness  visit in 6 months with labs.

## 2016-02-19 NOTE — Progress Notes (Signed)
Pre visit review using our clinic review tool, if applicable. No additional management support is needed unless otherwise documented below in the visit note.  Chief Complaint  Patient presents with  . Follow-up    adhd   . Hypertension    HPI: Barry Silva 55 y.o.  comes in for chronic disease/ medication management   ADHD MED  7- 8 am med . 12 and 3  takin 1/2 pill o fbp med had se of higher dose  BP checks occass  Relaxed  Is 120 130 range  bp med not taking  ocass forgetting  .  About  5 hours of sleep  Stays up playing .  ROS: See pertinent positives and negatives per HPI. No cv pulm sx at this time no change from [place]  Past Medical History  Diagnosis Date  . Migraine   . Sarcoidosis ([place])     bronchoscopy 2006, mtx started 04/19/10 try off 11/29/10 (atypical cp/ ? worsening ct chest wlh) off prednisone 7/11  . Refusal of blood transfusions as patient is Jehovah's Witness   . Nephrolithiasis     hx of episode 1/10 calcium stones  . ADD (attention deficit disorder with hyperactivity)     formal evaluation  . OSA (obstructive sleep apnea)     mild, Apnealink 7 2011 RDI7, O2 nadir 83%  . 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 MIGRAINE HEADACHE 08/30/2007    Qualifier: Diagnosis of  By: Sherren Mocha RN, Dorian Pod      Family History  Problem Relation Age of Onset  . Stroke      Grandfather father older age  . Colon cancer Neg Hx   . Esophageal cancer Neg Hx   . Rectal cancer Neg Hx   . Stomach cancer Neg Hx     Social History   Social History  . Marital Status: Married    Spouse Name: N/A  . Number of Children: N/A  . Years of Education: N/A   Social History Main Topics  . Smoking status: Never Smoker   . Smokeless tobacco: Never Used  . Alcohol Use: 4.8 oz/week    8 Glasses of wine per week     Comment: socially  . Drug Use: No  . Sexual Activity: Not Asked   Other Topics Concern  . 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   Had to move ;last year and sell house because of financial difficulties in business.   Better now.    M in law died last year was in [facility] and wife caretaker.    Family in [place]   No tobacco or ETS    Outpatient Prescriptions Prior to Visit  Medication Sig Dispense Refill  . losartan-hydrochlorothiazide (HYZAAR) 100-12.5 MG tablet Take 1 tablet by mouth daily. (Patient taking differently: Take 0.5 tablets by mouth daily. ) 90 tablet 1  . ADDERALL XR 30 MG 24 hr capsule Take 1 capsule (30 mg total) by mouth daily. Fill second 30 capsule 0  . ADDERALL XR 30 MG 24 hr capsule Take 1 capsule (30 mg total) by mouth daily. Fill last 30 capsule 0  . predniSONE (DELTASONE) 10 MG tablet Take pills per day,6,6,6,4,4,4,2,2,2,1,1,1 40 tablet 0  . tamsulosin (FLOMAX) 0.4 MG CAPS capsule Take 1 capsule (0.4 mg total) by mouth daily after supper. 10 capsule 0  No facility-administered medications prior to visit.     EXAM:  BP 132/90 mmHg  Temp(Src) 97.8 F (36.6 C) (Oral)  Wt 171 lb 6.4 oz (77.747 kg)  Body mass index is 26.25 kg/(m^2).  GENERAL: vitals reviewed and listed above, alert, oriented, appears well hydrated and in no acute distress HEENT: atraumatic, conjunctiva  clear, no obvious abnormalities on inspection of external nose and ears NECK: no obvious masses on inspection palpation  LUNGS: clear to auscultation bilaterally, no wheezes, rales or rhonchi, good air movement CV: HRRR, no clubbing cyanosis or  peripheral edema nl cap refill  MS: moves all extremities without noticeable focal  abnormality PSYCH: pleasant and cooperative, no obvious depression or anxiety Lab Results  Component Value Date   WBC 6.5 08/31/2015   HGB 16.8 08/31/2015   HCT 48.7 08/31/2015   PLT 322.0 08/31/2015   GLUCOSE 109* 08/18/2015   CHOL 196 06/08/2015   TRIG 79.0 06/08/2015   HDL 47.10 06/08/2015   LDLDIRECT 142.6 05/14/2013   LDLCALC 133*  06/08/2015   ALT 27 06/08/2015   AST 22 06/08/2015   NA 138 08/18/2015   K 3.7 08/18/2015   CL 103 08/18/2015   CREATININE 0.92 08/18/2015   BUN 16 08/18/2015   CO2 28 08/18/2015   TSH 0.87 06/08/2015   PSA 0.97 05/23/2014   INR 1.04 05/07/2010   BP Readings from Last 3 Encounters:  02/19/16 132/90  11/02/15 140/98  08/31/15 120/80   Wt Readings from Last 3 Encounters:  02/19/16 171 lb 6.4 oz (77.747 kg)  11/02/15 171 lb 3.2 oz (77.656 kg)  08/31/15 168 lb 4.8 oz (76.34 kg)    ASSESSMENT AND PLAN:  Discussed the following assessment and plan:  Attention deficit disorder  Essential hypertension  Medication management - better at home  tox screen due .  Counseled. bp  Control  Benefit more than risk of medications  to continue.  -Patient advised to return or notify health care team  if symptoms worsen ,persist or new concerns arise.  Patient Instructions  Make sure BP  Is in range  . tox screen today . Wellness  visit in 6 months with labs.     Standley Brooking. Antanasia Kaczynski M.D.

## 2016-05-20 ENCOUNTER — Other Ambulatory Visit: Payer: Self-pay | Admitting: Internal Medicine

## 2016-05-20 MED ORDER — ADDERALL XR 30 MG PO CP24: 30.0000 mg | ORAL_CAPSULE | Freq: Every day | ORAL | Status: DC

## 2016-05-20 NOTE — Telephone Encounter (Signed)
° ° °

## 2016-05-20 NOTE — Telephone Encounter (Signed)
rx placed at the front desk, ready for pick up the patient was notified.

## 2016-05-20 NOTE — Telephone Encounter (Signed)
Ok to refill.   rx printed 

## 2016-05-20 NOTE — Telephone Encounter (Signed)
Okay to fill? Filled on 02/19/2016 #30 + 2. Last OV 02/19/2016.

## 2016-08-16 ENCOUNTER — Other Ambulatory Visit (INDEPENDENT_AMBULATORY_CARE_PROVIDER_SITE_OTHER): Payer: Self-pay

## 2016-08-16 DIAGNOSIS — Z0000 Encounter for general adult medical examination without abnormal findings: Secondary | ICD-10-CM

## 2016-08-16 LAB — LIPID PANEL
Cholesterol: 222 mg/dL — ABNORMAL HIGH (ref 0–200)
HDL: 43.1 mg/dL (ref >39.00)
LDL CALC: 146 mg/dL — AB (ref 0–99)
NONHDL: 179.37
Total CHOL/HDL Ratio: 5
Triglycerides: 168 mg/dL — ABNORMAL HIGH (ref 0.0–149.0)
VLDL: 33.6 mg/dL (ref 0.0–40.0)

## 2016-08-16 LAB — CBC WITH DIFFERENTIAL/PLATELET
BASOS ABS: 0 10*3/uL (ref 0.0–0.1)
Basophils Relative: 0.7 % (ref 0.0–3.0)
Eosinophils Absolute: 0.2 10*3/uL (ref 0.0–0.7)
Eosinophils Relative: 3.3 % (ref 0.0–5.0)
HCT: 45.3 % (ref 39.0–52.0)
HEMOGLOBIN: 15.8 g/dL (ref 13.0–17.0)
LYMPHS ABS: 1.8 10*3/uL (ref 0.7–4.0)
Lymphocytes Relative: 31.9 % (ref 12.0–46.0)
MCHC: 34.8 g/dL (ref 30.0–36.0)
MCV: 89.2 fl (ref 78.0–100.0)
MONO ABS: 0.8 10*3/uL (ref 0.1–1.0)
MONOS PCT: 13.6 % — AB (ref 3.0–12.0)
NEUTROS PCT: 50.5 % (ref 43.0–77.0)
Neutro Abs: 2.8 10*3/uL (ref 1.4–7.7)
Platelets: 299 10*3/uL (ref 150.0–400.0)
RBC: 5.08 Mil/uL (ref 4.22–5.81)
RDW: 12.9 % (ref 11.5–15.5)
WBC: 5.6 10*3/uL (ref 4.0–10.5)

## 2016-08-16 LAB — HEPATIC FUNCTION PANEL
ALK PHOS: 72 U/L (ref 39–117)
ALT: 24 U/L (ref 0–53)
AST: 19 U/L (ref 0–37)
Albumin: 4.1 g/dL (ref 3.5–5.2)
BILIRUBIN TOTAL: 1.3 mg/dL — AB (ref 0.2–1.2)
Bilirubin, Direct: 0.2 mg/dL (ref 0.0–0.3)
Total Protein: 6.6 g/dL (ref 6.0–8.3)

## 2016-08-16 LAB — BASIC METABOLIC PANEL
BUN: 16 mg/dL (ref 6–23)
CALCIUM: 9 mg/dL (ref 8.4–10.5)
CO2: 31 mEq/L (ref 19–32)
Chloride: 102 mEq/L (ref 96–112)
Creatinine, Ser: 0.92 mg/dL (ref 0.40–1.50)
GFR: 91.33 mL/min (ref >60.00)
GLUCOSE: 99 mg/dL (ref 70–99)
Potassium: 4.2 mEq/L (ref 3.5–5.1)
SODIUM: 138 meq/L (ref 135–145)

## 2016-08-16 LAB — PSA: PSA: 1.11 ng/mL (ref 0.10–4.00)

## 2016-08-16 LAB — TSH: TSH: 1.53 u[IU]/mL (ref 0.35–4.50)

## 2016-08-23 ENCOUNTER — Encounter: Payer: BLUE CROSS/BLUE SHIELD | Admitting: Internal Medicine

## 2016-08-23 ENCOUNTER — Telehealth: Payer: Self-pay | Admitting: Family Medicine

## 2016-08-23 NOTE — Telephone Encounter (Signed)
Pt missed his cpx appt on 08/23/16.  Please help him to reschedule.  Insurance may not pay for lab work if not seen within 30 days from having lab work.  Thanks!!!

## 2016-08-23 NOTE — Telephone Encounter (Signed)
Pt said he did cancelled 08-23-16 appt and will check his work schedule and callback to [place]

## 2016-08-26 ENCOUNTER — Telehealth: Payer: Self-pay | Admitting: Internal Medicine

## 2016-08-26 ENCOUNTER — Ambulatory Visit: Payer: Self-pay | Admitting: Internal Medicine

## 2016-08-26 MED ORDER — ADDERALL XR 30 MG PO CP24: 30.0000 mg | ORAL_CAPSULE | Freq: Every day | ORAL | 0 refills | Status: DC

## 2016-08-26 NOTE — Telephone Encounter (Signed)
Ok to refill 1 month    Should get him to his next appt

## 2016-08-26 NOTE — Telephone Encounter (Signed)
Pt notified to pick up at the front desk. 

## 2016-08-26 NOTE — Telephone Encounter (Signed)
Pt scheduled for 09/06/16 for cpx.

## 2016-08-26 NOTE — Telephone Encounter (Signed)
Pt scheduled today

## 2016-08-26 NOTE — Telephone Encounter (Signed)
Pt is past due for med follow up.  Please help him to make an appointment.  Will then see if Dr. Regis Bill will fill until seen.  Thanks!!!

## 2016-08-26 NOTE — Telephone Encounter (Signed)
Pt need new Rx for Adderall °

## 2016-09-05 NOTE — Progress Notes (Signed)
Pre visit review using our clinic review tool, if applicable. No additional management support is needed unless otherwise documented below in the visit note.  Chief Complaint  Patient presents with  . Annual Exam    HPI: Patient  Barry Silva  55 y.o. comes in today for Preventive Health Care visit   Has a couple of issues of concern going on.   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 No specific injury but it is bothering him for the last 3 or 4 months.  He has a lesion on his right strength that he has a lesion on his left tong that he is bit recently and now is much bigger and is concerned about it is not bleeding.  He had his area on his right nasal bridge that hits the area where his glasses recently was able to get something out of it and it is gone down but please check  Bp seems ok at home not checking now   The last time he used his insurance called they told him it was canceled. Has a high premium high deductible plan from the individual marketplace and was never notified of why was canceled.  He has a hernia is that she has to be fixed.  Dietary wise doing okay but when he has a week or certain foods such as soy feels more tired.  He has a baseline cough but no flare of his sarcoid.  Health Maintenance  Topic Date Due  . INFLUENZA VACCINE  05/18/2017 (Originally 07/19/2016)  . HIV Screening  09/06/2017 (Originally 03/21/1976)  . TETANUS/TDAP  09/06/2017  . COLONOSCOPY  05/09/2019  . Hepatitis C Screening  Completed   Health Maintenance Review LIFESTYLE:  Exercise:  Active at his job Tobacco/ETS:n Alcohol: not doh Sugar beverages:n Sleep:somedec  Drug use: no HH of 3 wife and sister in law Work: self employed business   Many hours     ROS: see above  GEN/ HEENT: No fever, significant weight changes sweats headaches vision problems hearing changes, CV/ PULM; No chest pain  shortness of breath cough, syncope,edema  change in exercise tolerance. GI /GU: No adominal pain, vomiting, change in bowel habits. No blood in the stool. No significant GU symptoms. SKIN/HEME: ,no acute skin rashes suspicious lesions or bleeding. No lymphadenopathy, nodules, masses.  NEURO/ PSYCH:  No neurologic signs such as weakness numbness. No depression anxiety. IMM/ Allergy: No unusual infections.  Allergy .   REST of 12 system review negative except as per HPI   Past Medical History:  Diagnosis Date  . ADD (attention deficit disorder with hyperactivity)    formal evaluation  . 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 Migraine   . MIGRAINE HEADACHE 08/30/2007   Qualifier: Diagnosis of  By: Scherrie Gerlach    . Nephrolithiasis    hx of episode 1/10 calcium stones  . OSA (obstructive sleep apnea)    mild, Apnealink 7 2011 RDI7, O2 nadir 83%  . Refusal of blood transfusions as patient is Jehovah's Witness   . Sarcoidosis ([place])    bronchoscopy 2006, mtx started 04/19/10 try off 11/29/10 (atypical cp/ ? worsening ct chest wlh) off prednisone 7/11    Past Surgical History:  Procedure Laterality Date  . BRONCHOSCOPY  2006  . HERNIA REPAIR     age 39  . KIDNEY STONE SURGERY    .  KNEE SURGERY  1985 and 1986    Family History  Problem Relation Age of Onset  . Stroke      Grandfather father older age  . Colon cancer Neg Hx   . Esophageal cancer Neg Hx   . Rectal cancer Neg Hx   . Stomach cancer Neg Hx     Social History   Social History  . Marital status: Married    Spouse name: N/A  . Number of children: N/A  . Years of education: N/A   Social History Main Topics  . Smoking status: Never Smoker  . Smokeless tobacco: Never Used  . Alcohol use 4.8 oz/week    8 Glasses of wine per week     Comment: socially  . Drug use: No  . Sexual activity: Not Asked   Other Topics Concern  . None   Social History Narrative   Married   HH of 2   Works  owns  [employer]  Active physically .   Had to move ;last year and sell house because of financial difficulties in business.   Better now.    M in law died last year was in [facility] and wife caretaker.    Family in [place]   No tobacco or ETS    Outpatient Medications Prior to Visit  Medication Sig Dispense Refill  . losartan-hydrochlorothiazide (HYZAAR) 100-12.5 MG tablet Take 1 tablet by mouth daily. (Patient taking differently: Take 0.5 tablets by mouth daily. ) 90 tablet 1  . ADDERALL XR 30 MG 24 hr capsule Take 1 capsule (30 mg total) by mouth daily. 30 capsule 0  . ADDERALL XR 30 MG 24 hr capsule Take 1 capsule (30 mg total) by mouth daily. 30 capsule 0  . ADDERALL XR 30 MG 24 hr capsule Take 1 capsule (30 mg total) by mouth daily. 30 capsule 0   No facility-administered medications prior to visit.      EXAM:  BP (!) 138/98 (BP Location: Right Arm, Patient Position: Sitting, Cuff Size: Normal)   Temp 98.5 F (36.9 C) (Oral)   Ht 5' 7.5" (1.715 m)   Wt 163 lb 12.8 oz (74.3 kg)   BMI 25.28 kg/m   Body mass index is 25.28 kg/m.  Physical Exam: Vital signs reviewed [id number] is a well-developed well-nourished alert cooperative    who appearsr stated age in no acute distress.  HEENT: normocephalic atraumatic , Eyes: PERRL EOM's full, conjunctiva clear, Nares: paten,t no deformity discharge or tenderness., Ears: no deformity EAC's clear TMs with normal landmarks. Mouth: clear OP, tong left there is a polypoid pink lesion about 7-8 mm intact. This is on the edge of the time. edema.  Moist mucous membranes. Dentition in adequate repair. NECK: supple without masses, thyromegaly or bruits. CHEST/PULM:  Clear to auscultation and percussion breath sounds equal no wheeze , rales or rhonchi. No chest wall deformities or tenderness. CV: PMI is nondisplaced, S1 S2 no gallops, murmurs, rubs. Peripheral pulses are full without delay.No JVD .  ABDOMEN: Bowel sounds normal nontender  No guard or  rebound, no hepato splenomegal no CVA tenderness.  No hernia. Extremtities:  No clubbing cyanosis or edema, no acute joint swelling or redness no focal atrophy right shoulder reasonable range of motion some tenderness the right anterior cuff. No atrophy noted. NEURO:  Oriented x3, cranial nerves 3-12 appear to be intact, no obvious focal weakness,gait within normal limits  SKIN: No acute rashes normal turgor, color, no bruising or petechiae. Right nasal  bridge there is a 3 mm slightly raised papule that's not really pearly but some increased blood vessel no scaling although there is a rough spot that is palpated. PSYCH: Oriented, good eye contact, no obvious depression anxiety, cognition and judgment appear normal. LN: no cervical axillary inguinal adenopathy  Lab Results  Component Value Date   WBC 5.6 08/16/2016   HGB 15.8 08/16/2016   HCT 45.3 08/16/2016   PLT 299.0 08/16/2016   GLUCOSE 99 08/16/2016   CHOL 222 (H) 08/16/2016   TRIG 168.0 (H) 08/16/2016   HDL 43.10 08/16/2016   LDLDIRECT 142.6 05/14/2013   LDLCALC 146 (H) 08/16/2016   ALT 24 08/16/2016   AST 19 08/16/2016   NA 138 08/16/2016   K 4.2 08/16/2016   CL 102 08/16/2016   CREATININE 0.92 08/16/2016   BUN 16 08/16/2016   CO2 31 08/16/2016   TSH 1.53 08/16/2016   PSA 1.11 08/16/2016   INR 1.04 05/07/2010   BP Readings from Last 3 Encounters:  09/06/16 (!) 138/98  02/19/16 132/90  11/02/15 (!) 140/98   Wt Readings from Last 3 Encounters:  09/06/16 163 lb 12.8 oz (74.3 kg)  02/19/16 171 lb 6.4 oz (77.7 kg)  11/02/15 171 lb 3.2 oz (77.7 kg)    ASSESSMENT AND PLAN:  Discussed the following assessment and plan:  Visit for preventive health examination  Attention deficit disorder  Essential hypertension - inoast says bp has been ok at home inc when stress or checkin at office had se of higher  diuretic consider change if still up call us about this   Medication management - stay on same meds   uds next time    HLD (hyperlipidemia)  Tongue lesion - Plan: Ambulatory referral to Oral Maxillofacial Surgery  Does not have health insurance - kicked out of insurance  without  knowlege  . individual market place   Right shoulder pain Many thanks problematic try to prioritize his concerns. Agree with the stress that he was kicked out of the market place insurance without [place] or reason although had a very high premium and deductible. We agreed that we will go forward with an oral surgery consult because the lesion is getting bigger. Discussed exercises shoulder sports medicine evaluation at some point in the future. At this time we can continue on his ADHD medication with caution it is important for him to make sure his blood pressure is at goal. He should email [place] if it is not and we can consider other medications. Consider changing his ARB or adding a calcium channel blocker. Delayed his UDS today because of potential risk of cost. Patient Care Team: Burnis Medin, MD as PCP - General Chesley Mires, MD (Pulmonary Disease) Patient Instructions  Will arrange an oral surgery referral   About the tongue lesions Ask for self pay rate s or discounts  With you r care .  Let us know if you want to  Do sports med referral at any time   Bp up some today  And we can  Changes bp med to 100/25 instead of 100/12.5 if needed   .   Pay attention to diet  Healthy  Mediterranean diet  . Healthy lifestyle includes : At least 150 minutes of exercise weeks  , weight at healthy levels, which is usually   BMI 19-25. Avoid trans fats and processed foods;  Increase fresh fruits and veges to 5 servings per day. And avoid sweet beverages including tea and juice. Mediterranean diet  with olive oil and nuts have been noted to be heart and brain healthy . Avoid tobacco products . Limit  alcohol to  7 per week for women and 14 servings for men.  Get adequate sleep . Wear seat belts . Don't text and drive .     Generic  Shoulder Exercises EXERCISES  RANGE OF MOTION (ROM) AND STRETCHING EXERCISES These exercises may help you when beginning to rehabilitate your injury. Your symptoms may resolve with or without further involvement from your physician, physical therapist or athletic trainer. While completing these exercises, remember:   Restoring tissue flexibility helps normal motion to return to the joints. This allows healthier, less painful movement and activity.  An effective stretch should be held for at least 30 seconds.  A stretch should never be painful. You should only feel a gentle lengthening or release in the stretched tissue. ROM - Pendulum  Bend at the waist so that your right / left arm falls away from your body. Support yourself with your opposite hand on a solid surface, such as a table or a countertop.  Your right / left arm should be perpendicular to the ground. If it is not perpendicular, you need to lean over farther. Relax the muscles in your right / left arm and shoulder as much as possible.  Gently sway your hips and trunk so they move your right / left arm without any use of your right / left shoulder muscles.  Progress your movements so that your right / left arm moves side to side, then forward and backward, and finally, both clockwise and counterclockwise.  Complete __________ repetitions in each direction. Many people use this exercise to relieve discomfort in their shoulder as well as to gain range of motion. Repeat __________ times. Complete this exercise __________ times per day. STRETCH - Flexion, Standing  Stand with good posture. With an underhand grip on your right / left hand and an overhand grip on the opposite hand, grasp a broomstick or cane so that your hands are a little more than shoulder-width apart.  Keeping your right / left elbow straight and shoulder muscles relaxed, push the stick with your opposite hand to raise your right / left arm in front of your body and  then overhead. Raise your arm until you feel a stretch in your right / left shoulder, but before you have increased shoulder pain.  Try to avoid shrugging your right / left shoulder as your arm rises by keeping your shoulder blade tucked down and toward your mid-back spine. Hold __________ seconds.  Slowly return to the starting position. Repeat __________ times. Complete this exercise __________ times per day. STRETCH - Internal Rotation  Place your right / left hand behind your back, palm-up.  Throw a towel or belt over your opposite shoulder. Grasp the towel/belt with your right / left hand.  While keeping an upright posture, gently pull up on the towel/belt until you feel a stretch in the front of your right / left shoulder.  Avoid shrugging your right / left shoulder as your arm rises by keeping your shoulder blade tucked down and toward your mid-back spine.  Hold __________. Release the stretch by lowering your opposite hand. Repeat __________ times. Complete this exercise __________ times per day. STRETCH - External Rotation and Abduction  Stagger your stance through a doorframe. It does not matter which foot is forward.  As instructed by your physician, physical therapist or athletic trainer, place your hands:  And forearms  above your head and on the door frame.  And forearms at head-height and on the door frame.  At elbow-height and on the door frame.  Keeping your head and chest upright and your stomach muscles tight to prevent over-extending your low-back, slowly shift your weight onto your front foot until you feel a stretch across your chest and/or in the front of your shoulders.  Hold __________ seconds. Shift your weight to your back foot to release the stretch. Repeat __________ times. Complete this stretch __________ times per day.  STRENGTHENING EXERCISES  These exercises may help you when beginning to rehabilitate your injury. They may resolve your symptoms with  or without further involvement from your physician, physical therapist or athletic trainer. While completing these exercises, remember:   Muscles can gain both the endurance and the strength needed for everyday activities through controlled exercises.  Complete these exercises as instructed by your physician, physical therapist or athletic trainer. Progress the resistance and repetitions only as guided.  You may experience muscle soreness or fatigue, but the pain or discomfort you are trying to eliminate should never worsen during these exercises. If this pain does worsen, stop and make certain you are following the directions exactly. If the pain is still present after adjustments, discontinue the exercise until you can discuss the trouble with your clinician.  If advised by your physician, during your recovery, avoid activity or exercises which involve actions that place your right / left hand or elbow above your head or behind your back or head. These positions stress the tissues which are trying to heal. STRENGTH - Scapular Depression and Adduction  With good posture, sit on a firm chair. Supported your arms in front of you with pillows, arm rests or a table top. Have your elbows in line with the sides of your body.  Gently draw your shoulder blades down and toward your mid-back spine. Gradually increase the tension without tensing the muscles along the top of your shoulders and the back of your neck.  Hold for __________ seconds. Slowly release the tension and relax your muscles completely before completing the next repetition.  After you have practiced this exercise, remove the arm support and complete it in standing as well as sitting. Repeat __________ times. Complete this exercise __________ times per day.  STRENGTH - External Rotators  Secure a rubber exercise band/tubing to a fixed object so that it is at the same height as your right / left elbow when you are standing or sitting on a  firm surface.  Stand or sit so that the secured exercise band/tubing is at your side that is not injured.  Bend your elbow 90 degrees. Place a folded towel or small pillow under your right / left arm so that your elbow is a few inches away from your side.  Keeping the tension on the exercise band/tubing, pull it away from your body, as if pivoting on your elbow. Be sure to keep your body steady so that the movement is only coming from your shoulder rotating.  Hold __________ seconds. Release the tension in a controlled manner as you return to the starting position. Repeat __________ times. Complete this exercise __________ times per day.  STRENGTH - Supraspinatus  Stand or sit with good posture. Grasp a __________ weight or an exercise band/tubing so that your hand is "thumbs-up," like when you shake hands.  Slowly lift your right / left hand from your thigh into the air, traveling about 30 degrees from straight  out at your side. Lift your hand to shoulder height or as far as you can without increasing any shoulder pain. Initially, many people do not lift their hands above shoulder height.  Avoid shrugging your right / left shoulder as your arm rises by keeping your shoulder blade tucked down and toward your mid-back spine.  Hold for __________ seconds. Control the descent of your hand as you slowly return to your starting position. Repeat __________ times. Complete this exercise __________ times per day.  STRENGTH - Shoulder Extensors  Secure a rubber exercise band/tubing so that it is at the height of your shoulders when you are either standing or sitting on a firm arm-less chair.  With a thumbs-up grip, grasp an end of the band/tubing in each hand. Straighten your elbows and lift your hands straight in front of you at shoulder height. Step back away from the secured end of band/tubing until it becomes tense.  Squeezing your shoulder blades together, pull your hands down to the sides of  your thighs. Do not allow your hands to go behind you.  Hold for __________ seconds. Slowly ease the tension on the band/tubing as you reverse the directions and return to the starting position. Repeat __________ times. Complete this exercise __________ times per day.  STRENGTH - Scapular Retractors  Secure a rubber exercise band/tubing so that it is at the height of your shoulders when you are either standing or sitting on a firm arm-less chair.  With a palm-down grip, grasp an end of the band/tubing in each hand. Straighten your elbows and lift your hands straight in front of you at shoulder height. Step back away from the secured end of band/tubing until it becomes tense.  Squeezing your shoulder blades together, draw your elbows back as you bend them. Keep your upper arm lifted away from your body throughout the exercise.  Hold __________ seconds. Slowly ease the tension on the band/tubing as you reverse the directions and return to the starting position. Repeat __________ times. Complete this exercise __________ times per day. STRENGTH - Scapular Depressors  Find a sturdy chair without wheels, such as a from a dining room table.  Keeping your feet on the floor, lift your bottom from the seat and lock your elbows.  Keeping your elbows straight, allow gravity to pull your body weight down. Your shoulders will rise toward your ears.  Raise your body against gravity by drawing your shoulder blades down your back, shortening the distance between your shoulders and ears. Although your feet should always maintain contact with the floor, your feet should progressively support less body weight as you get stronger.  Hold __________ seconds. In a controlled and slow manner, lower your body weight to begin the next repetition. Repeat __________ times. Complete this exercise __________ times per day.    This information is not intended to replace advice given to you by your health care provider.  Make sure you discuss any questions you have with your health care provider.   Document Released: 10/19/2005 Document Revised: 12/26/2014 Document Reviewed: 03/19/2009 Elsevier Interactive Patient Education 2016 [place] K. Casen Pryor M.D.

## 2016-09-06 ENCOUNTER — Encounter: Payer: Self-pay | Admitting: Internal Medicine

## 2016-09-06 ENCOUNTER — Ambulatory Visit (INDEPENDENT_AMBULATORY_CARE_PROVIDER_SITE_OTHER): Payer: Self-pay | Admitting: Internal Medicine

## 2016-09-06 VITALS — BP 138/98 | Temp 98.5°F | Ht 67.5 in | Wt 163.8 lb

## 2016-09-06 DIAGNOSIS — K148 Other diseases of tongue: Secondary | ICD-10-CM

## 2016-09-06 DIAGNOSIS — Z0000 Encounter for general adult medical examination without abnormal findings: Secondary | ICD-10-CM

## 2016-09-06 DIAGNOSIS — E785 Hyperlipidemia, unspecified: Secondary | ICD-10-CM

## 2016-09-06 DIAGNOSIS — F909 Attention-deficit hyperactivity disorder, unspecified type: Secondary | ICD-10-CM

## 2016-09-06 DIAGNOSIS — Z79899 Other long term (current) drug therapy: Secondary | ICD-10-CM

## 2016-09-06 DIAGNOSIS — Z5989 Other problems related to housing and economic circumstances: Secondary | ICD-10-CM

## 2016-09-06 DIAGNOSIS — I10 Essential (primary) hypertension: Secondary | ICD-10-CM

## 2016-09-06 DIAGNOSIS — F988 Other specified behavioral and emotional disorders with onset usually occurring in childhood and adolescence: Secondary | ICD-10-CM

## 2016-09-06 DIAGNOSIS — Z598 Other problems related to housing and economic circumstances: Secondary | ICD-10-CM

## 2016-09-06 DIAGNOSIS — M25511 Pain in right shoulder: Secondary | ICD-10-CM

## 2016-09-06 MED ORDER — ADDERALL XR 30 MG PO CP24: 30.0000 mg | ORAL_CAPSULE | Freq: Every day | ORAL | 0 refills | Status: DC

## 2016-09-06 NOTE — Patient Instructions (Addendum)
Will arrange an oral surgery referral   About the tongue lesions Ask for self pay rate s or discounts  With you r care .  Let us know if you want to  Do sports med referral at any time   Bp up some today  And we can  Changes bp med to 100/25 instead of 100/12.5 if needed   .   Pay attention to diet  Healthy  Mediterranean diet  . Healthy lifestyle includes : At least 150 minutes of exercise weeks  , weight at healthy levels, which is usually   BMI 19-25. Avoid trans fats and processed foods;  Increase fresh fruits and veges to 5 servings per day. And avoid sweet beverages including tea and juice. Mediterranean diet with olive oil and nuts have been noted to be heart and brain healthy . Avoid tobacco products . Limit  alcohol to  7 per week for women and 14 servings for men.  Get adequate sleep . Wear seat belts . Don't text and drive .     Generic Shoulder Exercises EXERCISES  RANGE OF MOTION (ROM) AND STRETCHING EXERCISES These exercises may help you when beginning to rehabilitate your injury. Your symptoms may resolve with or without further involvement from your physician, physical therapist or athletic trainer. While completing these exercises, remember:   Restoring tissue flexibility helps normal motion to return to the joints. This allows healthier, less painful movement and activity.  An effective stretch should be held for at least 30 seconds.  A stretch should never be painful. You should only feel a gentle lengthening or release in the stretched tissue. ROM - Pendulum  Bend at the waist so that your right / left arm falls away from your body. Support yourself with your opposite hand on a solid surface, such as a table or a countertop.  Your right / left arm should be perpendicular to the ground. If it is not perpendicular, you need to lean over farther. Relax the muscles in your right / left arm and shoulder as much as possible.  Gently sway your hips and trunk so they  move your right / left arm without any use of your right / left shoulder muscles.  Progress your movements so that your right / left arm moves side to side, then forward and backward, and finally, both clockwise and counterclockwise.  Complete __________ repetitions in each direction. Many people use this exercise to relieve discomfort in their shoulder as well as to gain range of motion. Repeat __________ times. Complete this exercise __________ times per day. STRETCH - Flexion, Standing  Stand with good posture. With an underhand grip on your right / left hand and an overhand grip on the opposite hand, grasp a broomstick or cane so that your hands are a little more than shoulder-width apart.  Keeping your right / left elbow straight and shoulder muscles relaxed, push the stick with your opposite hand to raise your right / left arm in front of your body and then overhead. Raise your arm until you feel a stretch in your right / left shoulder, but before you have increased shoulder pain.  Try to avoid shrugging your right / left shoulder as your arm rises by keeping your shoulder blade tucked down and toward your mid-back spine. Hold __________ seconds.  Slowly return to the starting position. Repeat __________ times. Complete this exercise __________ times per day. STRETCH - Internal Rotation  Place your right / left hand behind your back,  palm-up.  Throw a towel or belt over your opposite shoulder. Grasp the towel/belt with your right / left hand.  While keeping an upright posture, gently pull up on the towel/belt until you feel a stretch in the front of your right / left shoulder.  Avoid shrugging your right / left shoulder as your arm rises by keeping your shoulder blade tucked down and toward your mid-back spine.  Hold __________. Release the stretch by lowering your opposite hand. Repeat __________ times. Complete this exercise __________ times per day. STRETCH - External Rotation  and Abduction  Stagger your stance through a doorframe. It does not matter which foot is forward.  As instructed by your physician, physical therapist or athletic trainer, place your hands:  And forearms above your head and on the door frame.  And forearms at head-height and on the door frame.  At elbow-height and on the door frame.  Keeping your head and chest upright and your stomach muscles tight to prevent over-extending your low-back, slowly shift your weight onto your front foot until you feel a stretch across your chest and/or in the front of your shoulders.  Hold __________ seconds. Shift your weight to your back foot to release the stretch. Repeat __________ times. Complete this stretch __________ times per day.  STRENGTHENING EXERCISES  These exercises may help you when beginning to rehabilitate your injury. They may resolve your symptoms with or without further involvement from your physician, physical therapist or athletic trainer. While completing these exercises, remember:   Muscles can gain both the endurance and the strength needed for everyday activities through controlled exercises.  Complete these exercises as instructed by your physician, physical therapist or athletic trainer. Progress the resistance and repetitions only as guided.  You may experience muscle soreness or fatigue, but the pain or discomfort you are trying to eliminate should never worsen during these exercises. If this pain does worsen, stop and make certain you are following the directions exactly. If the pain is still present after adjustments, discontinue the exercise until you can discuss the trouble with your clinician.  If advised by your physician, during your recovery, avoid activity or exercises which involve actions that place your right / left hand or elbow above your head or behind your back or head. These positions stress the tissues which are trying to heal. STRENGTH - Scapular Depression  and Adduction  With good posture, sit on a firm chair. Supported your arms in front of you with pillows, arm rests or a table top. Have your elbows in line with the sides of your body.  Gently draw your shoulder blades down and toward your mid-back spine. Gradually increase the tension without tensing the muscles along the top of your shoulders and the back of your neck.  Hold for __________ seconds. Slowly release the tension and relax your muscles completely before completing the next repetition.  After you have practiced this exercise, remove the arm support and complete it in standing as well as sitting. Repeat __________ times. Complete this exercise __________ times per day.  STRENGTH - External Rotators  Secure a rubber exercise band/tubing to a fixed object so that it is at the same height as your right / left elbow when you are standing or sitting on a firm surface.  Stand or sit so that the secured exercise band/tubing is at your side that is not injured.  Bend your elbow 90 degrees. Place a folded towel or small pillow under your right / left  arm so that your elbow is a few inches away from your side.  Keeping the tension on the exercise band/tubing, pull it away from your body, as if pivoting on your elbow. Be sure to keep your body steady so that the movement is only coming from your shoulder rotating.  Hold __________ seconds. Release the tension in a controlled manner as you return to the starting position. Repeat __________ times. Complete this exercise __________ times per day.  STRENGTH - Supraspinatus  Stand or sit with good posture. Grasp a __________ weight or an exercise band/tubing so that your hand is "thumbs-up," like when you shake hands.  Slowly lift your right / left hand from your thigh into the air, traveling about 30 degrees from straight out at your side. Lift your hand to shoulder height or as far as you can without increasing any shoulder pain. Initially,  many people do not lift their hands above shoulder height.  Avoid shrugging your right / left shoulder as your arm rises by keeping your shoulder blade tucked down and toward your mid-back spine.  Hold for __________ seconds. Control the descent of your hand as you slowly return to your starting position. Repeat __________ times. Complete this exercise __________ times per day.  STRENGTH - Shoulder Extensors  Secure a rubber exercise band/tubing so that it is at the height of your shoulders when you are either standing or sitting on a firm arm-less chair.  With a thumbs-up grip, grasp an end of the band/tubing in each hand. Straighten your elbows and lift your hands straight in front of you at shoulder height. Step back away from the secured end of band/tubing until it becomes tense.  Squeezing your shoulder blades together, pull your hands down to the sides of your thighs. Do not allow your hands to go behind you.  Hold for __________ seconds. Slowly ease the tension on the band/tubing as you reverse the directions and return to the starting position. Repeat __________ times. Complete this exercise __________ times per day.  STRENGTH - Scapular Retractors  Secure a rubber exercise band/tubing so that it is at the height of your shoulders when you are either standing or sitting on a firm arm-less chair.  With a palm-down grip, grasp an end of the band/tubing in each hand. Straighten your elbows and lift your hands straight in front of you at shoulder height. Step back away from the secured end of band/tubing until it becomes tense.  Squeezing your shoulder blades together, draw your elbows back as you bend them. Keep your upper arm lifted away from your body throughout the exercise.  Hold __________ seconds. Slowly ease the tension on the band/tubing as you reverse the directions and return to the starting position. Repeat __________ times. Complete this exercise __________ times per  day. STRENGTH - Scapular Depressors  Find a sturdy chair without wheels, such as a from a dining room table.  Keeping your feet on the floor, lift your bottom from the seat and lock your elbows.  Keeping your elbows straight, allow gravity to pull your body weight down. Your shoulders will rise toward your ears.  Raise your body against gravity by drawing your shoulder blades down your back, shortening the distance between your shoulders and ears. Although your feet should always maintain contact with the floor, your feet should progressively support less body weight as you get stronger.  Hold __________ seconds. In a controlled and slow manner, lower your body weight to begin the next  repetition. Repeat __________ times. Complete this exercise __________ times per day.    This information is not intended to replace advice given to you by your health care provider. Make sure you discuss any questions you have with your health care provider.   Document Released: 10/19/2005 Document Revised: 12/26/2014 Document Reviewed: 03/19/2009 Elsevier Interactive Patient Education [employer].

## 2016-09-12 ENCOUNTER — Telehealth: Payer: Self-pay | Admitting: Internal Medicine

## 2016-09-12 NOTE — Telephone Encounter (Signed)
Pt would like to know a name of a oral surgeon since he does not have insurance.

## 2016-09-13 NOTE — Telephone Encounter (Signed)
Spoke to the pt.  He was looking for a recommendation for an oral surgeon.  He no longer has insurance.  I advised him to shop around to see who will take self pay patients and who would be the cheapest.  Pt agreed.

## 2016-10-25 ENCOUNTER — Other Ambulatory Visit: Payer: Self-pay | Admitting: Internal Medicine

## 2016-10-26 NOTE — Telephone Encounter (Signed)
Sent to the pharmacy by e-scribe. Is scheduled for follow up on 02/21/17.

## 2016-12-29 ENCOUNTER — Telehealth: Payer: Self-pay | Admitting: Internal Medicine

## 2016-12-29 NOTE — Telephone Encounter (Signed)
Pt request refill ADDERALL XR 30 MG 24 hr capsule °3 mo supply °

## 2016-12-30 MED ORDER — ADDERALL XR 30 MG PO CP24: 30.0000 mg | ORAL_CAPSULE | Freq: Every day | ORAL | 0 refills | Status: DC

## 2016-12-30 NOTE — Telephone Encounter (Signed)
Spoke to the pt and informed him rx ready for pick up at the front desk.  He has not been checking bp readings.  Nephew borrowed his bp cuff.  Stated he will start checking and will buy another.

## 2016-12-30 NOTE — Telephone Encounter (Signed)
Ok to refill x 90 days  Record  Bp readings and control  .

## 2017-01-30 ENCOUNTER — Telehealth: Payer: Self-pay | Admitting: Internal Medicine

## 2017-01-30 NOTE — Telephone Encounter (Signed)
Please disregard pt has 2 rx left

## 2017-01-30 NOTE — Telephone Encounter (Signed)
Pt request refill ADDERALL XR 30 MG 24 hr capsule °3 mo supply °

## 2017-02-07 ENCOUNTER — Ambulatory Visit: Payer: Self-pay | Admitting: Internal Medicine

## 2017-02-21 ENCOUNTER — Ambulatory Visit: Payer: Self-pay | Admitting: Internal Medicine

## 2017-02-21 DIAGNOSIS — Z0289 Encounter for other administrative examinations: Secondary | ICD-10-CM

## 2017-02-21 NOTE — Progress Notes (Deleted)
No chief complaint on file.   HPI: Barry Silva 56 y.o. come in for Chronic disease management  ROS: See pertinent positives and negatives per HPI.  Past Medical History:  Diagnosis Date  . ADD (attention deficit disorder with hyperactivity)    formal evaluation  . 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 Migraine   . MIGRAINE HEADACHE 08/30/2007   Qualifier: Diagnosis of  By: Scherrie Gerlach    . Nephrolithiasis    hx of episode 1/10 calcium stones  . OSA (obstructive sleep apnea)    mild, Apnealink 7 2011 RDI7, O2 nadir 83%  . Refusal of blood transfusions as patient is Jehovah's Witness   . Sarcoidosis ([place])    bronchoscopy 2006, mtx started 04/19/10 try off 11/29/10 (atypical cp/ ? worsening ct chest wlh) off prednisone 7/11    Family History  Problem Relation Age of Onset  . Stroke      Grandfather father older age  . Colon cancer Neg Hx   . Esophageal cancer Neg Hx   . Rectal cancer Neg Hx   . Stomach cancer Neg Hx     Social History   Social History  . Marital status: Married    Spouse name: N/A  . Number of children: N/A  . Years of education: N/A   Social History Main Topics  . Smoking status: Never Smoker  . Smokeless tobacco: Never Used  . Alcohol use 4.8 oz/week    8 Glasses of wine per week     Comment: socially  . Drug use: No  . Sexual activity: Not on file   Other Topics Concern  . 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   Had to move ;last year and sell house because of financial difficulties in business.   Better now.    M in law died last year was in [facility] and wife caretaker.    Family in [place]   No tobacco or ETS    Outpatient Medications Prior to Visit  Medication Sig Dispense Refill  . ADDERALL XR 30 MG 24 hr capsule Take 1 capsule (30 mg total) by mouth daily. 30 capsule 0  . ADDERALL XR 30 MG 24 hr capsule Take 1 capsule (30 mg total) by  mouth daily. 30 capsule 0  . ADDERALL XR 30 MG 24 hr capsule Take 1 capsule (30 mg total) by mouth daily. 30 capsule 0  . losartan-hydrochlorothiazide (HYZAAR) 100-12.5 MG tablet TAKE 1 TABLET BY MOUTH DAILY. 90 tablet 1   No facility-administered medications prior to visit.      EXAM:  There were no vitals taken for this visit.  There is no height or weight on file to calculate BMI.  GENERAL: vitals reviewed and listed above, alert, oriented, appears well hydrated and in no acute distress HEENT: atraumatic, conjunctiva  clear, no obvious abnormalities on inspection of external nose and ears OP : no lesion edema or exudate  NECK: no obvious masses on inspection palpation  LUNGS: clear to auscultation bilaterally, no wheezes, rales or rhonchi, good air movement CV: HRRR, no clubbing cyanosis or  peripheral edema nl cap refill  MS: moves all extremities without noticeable focal  abnormality PSYCH: pleasant and cooperative, no obvious depression or anxiety Lab Results  Component Value Date   WBC 5.6 08/16/2016   HGB 15.8 08/16/2016  HCT 45.3 08/16/2016   PLT 299.0 08/16/2016   GLUCOSE 99 08/16/2016   CHOL 222 (H) 08/16/2016   TRIG 168.0 (H) 08/16/2016   HDL 43.10 08/16/2016   LDLDIRECT 142.6 05/14/2013   LDLCALC 146 (H) 08/16/2016   ALT 24 08/16/2016   AST 19 08/16/2016   NA 138 08/16/2016   K 4.2 08/16/2016   CL 102 08/16/2016   CREATININE 0.92 08/16/2016   BUN 16 08/16/2016   CO2 31 08/16/2016   TSH 1.53 08/16/2016   PSA 1.11 08/16/2016   INR 1.04 05/07/2010   BP Readings from Last 3 Encounters:  09/06/16 (!) 138/98  02/19/16 132/90  11/02/15 (!) 140/98    ASSESSMENT AND PLAN:  Discussed the following assessment and plan:  Essential hypertension  Medication management  Attention deficit hyperactivity disorder (ADHD), other type cpx labs and med check in 6 months   ? uds  tox screen  -Patient advised to return or notify health care team  if  new concerns  arise.  There are no Patient Instructions on file for this visit.   Standley Brooking. Panosh M.D.

## 2017-03-02 ENCOUNTER — Other Ambulatory Visit: Payer: Self-pay | Admitting: Internal Medicine

## 2017-03-02 NOTE — Telephone Encounter (Signed)
Spoke to the pt.  Advised that he was given 3 prescriptions on 12/30/16.  Should last until April.  Pt stated he forgot that he was given 3 prescriptions and will call back when needed.

## 2017-03-02 NOTE — Telephone Encounter (Signed)
Pt has an appt on 03-06-17 and would like to pick up new rx generic adderall xr

## 2017-03-06 ENCOUNTER — Encounter: Payer: Self-pay | Admitting: Internal Medicine

## 2017-03-06 ENCOUNTER — Ambulatory Visit (INDEPENDENT_AMBULATORY_CARE_PROVIDER_SITE_OTHER): Payer: Self-pay | Admitting: Internal Medicine

## 2017-03-06 VITALS — BP 130/90 | HR 98 | Temp 97.9°F | Ht 67.5 in | Wt 167.7 lb

## 2017-03-06 DIAGNOSIS — Z79899 Other long term (current) drug therapy: Secondary | ICD-10-CM

## 2017-03-06 DIAGNOSIS — I10 Essential (primary) hypertension: Secondary | ICD-10-CM

## 2017-03-06 DIAGNOSIS — F908 Attention-deficit hyperactivity disorder, other type: Secondary | ICD-10-CM

## 2017-03-06 MED ORDER — AMLODIPINE BESYLATE 5 MG PO TABS: 5.0000 mg | ORAL_TABLET | Freq: Every day | ORAL | 1 refills | Status: DC

## 2017-03-06 MED ORDER — ADDERALL XR 30 MG PO CP24: 30.0000 mg | ORAL_CAPSULE | Freq: Every day | ORAL | 0 refills | Status: DC

## 2017-03-06 MED ORDER — LOSARTAN POTASSIUM 100 MG PO TABS: 100.0000 mg | ORAL_TABLET | Freq: Every day | ORAL | 1 refills | Status: DC

## 2017-03-06 NOTE — Patient Instructions (Signed)
Try taking a half of the losartan HCTZ alternating with one a day. If blood pressure is controlled we can continue However when running out we can switch to plain losartan plus amlodipine. Contact us when you're running out of her blood pressure medicine or plan. Newer goals are closer to 120/80 for long-term health. Preventive visit and checkup months with med check. Let us know your blood pressure readings in the meantime.

## 2017-03-06 NOTE — Progress Notes (Signed)
Chief Complaint  Patient presents with  . Follow-up    HPI: Barry Silva 56 y.o. come in for Chronic disease management  Doesn't have health insurance coverage paying out of pocket. ADHD taking his Adderall most days still seems quite helpful and denies any significant side effects psychological sleep GI. 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 Blood pressure usually about 138/92 or range at home he is taking a half of 100/12.5 losartan HCTZ because he had side effects of the diuretic part on the higher dose. Still has a number of these pills at home and no side effects..  ROS: See pertinent positives and negatives per HPI.  Past Medical History:  Diagnosis Date  . ADD (attention deficit disorder with hyperactivity)    formal evaluation  . 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 Migraine   . MIGRAINE HEADACHE 08/30/2007   Qualifier: Diagnosis of  By: Scherrie Gerlach    . Nephrolithiasis    hx of episode 1/10 calcium stones  . OSA (obstructive sleep apnea)    mild, Apnealink 7 2011 RDI7, O2 nadir 83%  . Refusal of blood transfusions as patient is Jehovah's Witness   . Sarcoidosis ([place])    bronchoscopy 2006, mtx started 04/19/10 try off 11/29/10 (atypical cp/ ? worsening ct chest wlh) off prednisone 7/11    Family History  Problem Relation Age of Onset  . Stroke      Grandfather father older age  . Colon cancer Neg Hx   . Esophageal cancer Neg Hx   . Rectal cancer Neg Hx   . Stomach cancer Neg Hx     Social History   Social History  . Marital status: Married    Spouse name: N/A  . Number of children: N/A  . Years of education: N/A   Social History Main Topics  . Smoking status: Never Smoker  . Smokeless tobacco: Never Used  . Alcohol use 4.8 oz/week    8 Glasses of wine per week     Comment: socially  . Drug use: No  . Sexual activity: Not Asked   Other Topics Concern  . 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   Had to move ;last year and sell house because of financial difficulties in business.   Better now.    M in law died last year was in [facility] and wife caretaker.    Family in [place]   No tobacco or ETS    Outpatient Medications Prior to Visit  Medication Sig Dispense Refill  . losartan-hydrochlorothiazide (HYZAAR) 100-12.5 MG tablet TAKE 1 TABLET BY MOUTH DAILY. 90 tablet 1  . ADDERALL XR 30 MG 24 hr capsule Take 1 capsule (30 mg total) by mouth daily. 30 capsule 0  . ADDERALL XR 30 MG 24 hr capsule Take 1 capsule (30 mg total) by mouth daily. 30 capsule 0  . ADDERALL XR 30 MG 24 hr capsule Take 1 capsule (30 mg total) by mouth daily. 30 capsule 0   No facility-administered medications prior to visit.      EXAM:  BP 130/90 (BP Location: Right Arm, Patient Position: Sitting, Cuff Size: Normal)   Pulse 98   Temp 97.9 F (36.6 C) (Oral)   Ht 5' 7.5" (1.715 m)  Wt 167 lb 11.2 oz (76.1 kg)   BMI 25.88 kg/m   Body mass index is 25.88 kg/m.  GENERAL: vitals reviewed and listed above, alert, oriented, appears well hydrated and in no acute distress HEENT: atraumatic, conjunctiva  clear, no obvious abnormalities on inspection of external nose and ears NECK: no obvious masses on inspection palpation  LUNGS: clear to auscultation bilaterally, no wheezes, rales or rhonchi, good air movement CV: HRRR, no clubbing cyanosis or  peripheral edema nl cap refill  MS: moves all extremities without noticeable focal  abnormality PSYCH: pleasant and cooperative, no obvious depression or anxiety Lab Results  Component Value Date   WBC 5.6 08/16/2016   HGB 15.8 08/16/2016   HCT 45.3 08/16/2016   PLT 299.0 08/16/2016   GLUCOSE 99 08/16/2016   CHOL 222 (H) 08/16/2016   TRIG 168.0 (H) 08/16/2016   HDL 43.10 08/16/2016   LDLDIRECT 142.6 05/14/2013   LDLCALC 146 (H) 08/16/2016   ALT 24 08/16/2016    AST 19 08/16/2016   NA 138 08/16/2016   K 4.2 08/16/2016   CL 102 08/16/2016   CREATININE 0.92 08/16/2016   BUN 16 08/16/2016   CO2 31 08/16/2016   TSH 1.53 08/16/2016   PSA 1.11 08/16/2016   INR 1.04 05/07/2010   BP Readings from Last 3 Encounters:  03/06/17 130/90  09/06/16 (!) 138/98  02/19/16 132/90    ASSESSMENT AND PLAN:  Discussed the following assessment and plan:  Attention deficit hyperactivity disorder (ADHD), other type  Medication management  Essential hypertension Hypertension could be better controlled. Option increase the losartan HCTZ to every other day and a half every other day while he has it or switch to losartan 100 mg and add amlodipine 5 mg a day. Prescription printed he can check into the cost. There are many other options in the future also. Goals for heart health reviewed with patient. Should give [place] a blood pressure update when he needs a refill of his medication otherwise 6 month preventive visit checkup med evaluation. -Patient advised to return or notify health care team  if  new concerns arise.  Patient Instructions  Try taking a half of the losartan HCTZ alternating with one a day. If blood pressure is controlled we can continue However when running out we can switch to plain losartan plus amlodipine. Contact us when you're running out of her blood pressure medicine or plan. Newer goals are closer to 120/80 for long-term health. Preventive visit and checkup months with med check. Let us know your blood pressure readings in the meantime.    Standley Brooking. Zubayr Bednarczyk M.D.

## 2017-06-23 ENCOUNTER — Encounter: Payer: Self-pay | Admitting: Family Medicine

## 2017-06-23 ENCOUNTER — Telehealth: Payer: Self-pay | Admitting: *Deleted

## 2017-06-23 ENCOUNTER — Ambulatory Visit (INDEPENDENT_AMBULATORY_CARE_PROVIDER_SITE_OTHER): Payer: PRIVATE HEALTH INSURANCE | Admitting: Family Medicine

## 2017-06-23 VITALS — BP 122/80 | HR 83 | Temp 98.6°F | Ht 67.5 in | Wt 159.9 lb

## 2017-06-23 DIAGNOSIS — L255 Unspecified contact dermatitis due to plants, except food: Secondary | ICD-10-CM

## 2017-06-23 MED ORDER — ADDERALL XR 30 MG PO CP24: 30.0000 mg | ORAL_CAPSULE | Freq: Every day | ORAL | 0 refills | Status: DC

## 2017-06-23 MED ORDER — PREDNISONE 10 MG PO TABS: ORAL_TABLET | ORAL | 0 refills | Status: DC

## 2017-06-23 NOTE — Telephone Encounter (Signed)
Ok to refill today  Printed

## 2017-06-23 NOTE — Telephone Encounter (Signed)
Patient is in the office seeing Dr Maudie Mercury today for poison ivy and requested a refill on Adderall.  Message sent to Dr Regis Bill.

## 2017-06-23 NOTE — Progress Notes (Signed)
HPI:  Acute visit for poison ivy rash.  Started a few days ago. Climber and get poison ivy yearly. Patches of itchy rash on both upper and lower extremities and the L ear. Denies difficulty breathing, lesions on or near eyes or mucus membranes, fever, tick bites. Wants oral prednisone - reports responds well to this.  ROS: See pertinent positives and negatives per HPI.  Past Medical History:  Diagnosis Date  . ADD (attention deficit disorder with hyperactivity)    formal evaluation  . 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 Migraine   . MIGRAINE HEADACHE 08/30/2007   Qualifier: Diagnosis of  By: Scherrie Gerlach    . Nephrolithiasis    hx of episode 1/10 calcium stones  . OSA (obstructive sleep apnea)    mild, Apnealink 7 2011 RDI7, O2 nadir 83%  . Refusal of blood transfusions as patient is Jehovah's Witness   . Sarcoidosis    bronchoscopy 2006, mtx started 04/19/10 try off 11/29/10 (atypical cp/ ? worsening ct chest [facility]) off prednisone 7/11    Past Surgical History:  Procedure Laterality Date  . BRONCHOSCOPY  2006  . HERNIA REPAIR     age 13  . KIDNEY STONE SURGERY    . KNEE SURGERY  1985 and 1986    Family History  Problem Relation Age of Onset  . Stroke Unknown        Grandfather father older age  . Colon cancer Neg Hx   . Esophageal cancer Neg Hx   . Rectal cancer Neg Hx   . Stomach cancer Neg Hx     Social History   Social History  . Marital status: Married    Spouse name: N/A  . Number of children: N/A  . Years of education: N/A   Social History Main Topics  . Smoking status: Never Smoker  . Smokeless tobacco: Never Used  . Alcohol use 4.8 oz/week    8 Glasses of wine per week     Comment: socially  . Drug use: No  . Sexual activity: Not Asked   Other Topics Concern  . 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   Had to move ;last year and sell house because of  financial difficulties in business.   Better now.    M in law died last year was in [facility] and wife caretaker.    Family in [place]   No tobacco or ETS     Current Outpatient Prescriptions:  .  amLODipine (NORVASC) 5 MG tablet, Take 1 tablet (5 mg total) by mouth daily., Disp: 30 tablet, Rfl: 1 .  losartan (COZAAR) 100 MG tablet, Take 1 tablet (100 mg total) by mouth daily., Disp: 30 tablet, Rfl: 1 .  losartan-hydrochlorothiazide (HYZAAR) 100-12.5 MG tablet, TAKE 1 TABLET BY MOUTH DAILY., Disp: 90 tablet, Rfl: 1 .  ADDERALL XR 30 MG 24 hr capsule, Take 1 capsule (30 mg total) by mouth daily., Disp: 30 capsule, Rfl: 0 .  ADDERALL XR 30 MG 24 hr capsule, Take 1 capsule (30 mg total) by mouth daily., Disp: 30 capsule, Rfl: 0 .  ADDERALL XR 30 MG 24 hr capsule, Take 1 capsule (30 mg total) by mouth daily., Disp: 30 capsule, Rfl: 0 .  predniSONE (DELTASONE) 10 MG tablet, 5 tablets (50mg ) daily for 3 days, then 4 tablets (40 mg) daily for 3  days, then 3 tablets (30mg ) daily for 3 days, then 2 tablets (20 mg) daily for 3 days, then 1 tablet (10 mg) daily for 3 days., Disp: 45 tablet, Rfl: 0  EXAM:  Vitals:   06/23/17 1552  BP: 122/80  Pulse: 83  Temp: 98.6 F (37 C)    Body mass index is 24.67 kg/m.  GENERAL: vitals reviewed and listed above, alert, oriented, appears well hydrated and in no acute distress  HEENT: atraumatic, conjunttiva clear, no obvious abnormalities on inspection of external nose and ears  NECK: no obvious masses on inspection  LUNGS: clear to auscultation bilaterally, no wheezes, rales or rhonchi, good air movement  CV: HRRR, no peripheral edema  SKIN: scattered patches of erythematous papular and excoriated rash on both upper and lower extremities and the L ear.  MS: moves all extremities without noticeable abnormality  PSYCH: pleasant and cooperative, no obvious depression or anxiety  ASSESSMENT AND PLAN:  Discussed the following assessment and  plan:  Toxicodendron dermatitis  -we discussed possible serious and likely etiologies, workup and treatment options, treatment risks and return precautions - toxicodendron dermatitis and several chigger bites most likely -after this discussion, Arnav opted for oral prednisone taper -follow up advised as needed -of course, we advised Ustin  to return or notify a doctor immediately if symptoms worsen or persist or new concerns arise.    Patient Instructions  Use the prednisone as instructed.  I hope you are feeling better soon! Seek care immediately if worsening, new concerns or you are not improving with treatment.   Poison Ivy Dermatitis Poison ivy dermatitis is redness and soreness (inflammation) of the skin. It is caused by a chemical that is found on the leaves of the poison ivy plant. You may also have itching, a rash, and blisters. Symptoms often clear up in 1-2 weeks. You may get this condition by touching a poison ivy plant. You can also get it by touching something that has the chemical on it. This may include animals or objects that have come in contact with the plant. Follow these instructions at home: General instructions  Take or apply over-the-counter and prescription medicines only as told by your doctor.  If you touch poison ivy, wash your skin with soap and cold water right away.  Use hydrocortisone creams or calamine lotion as needed to help with itching.  Take oatmeal baths as needed. Use colloidal oatmeal. You can get this at a pharmacy or grocery store. Follow the instructions on the package.  Do not scratch or rub your skin.  While you have the rash, wash your clothes right after you wear them. Prevention  Know what poison ivy looks like so you can avoid it. This plant has three leaves with flowering branches on a single stem. The leaves are glossy. They have uneven edges that come to a point at the front.  If you have touched poison ivy, wash with soap  and water right away. Be sure to wash under your fingernails.  When hiking or camping, wear long pants, a long-sleeved shirt, tall socks, and hiking boots. You can also use a lotion on your skin that helps to prevent contact with the chemical on the plant.  If you think that your clothes or outdoor gear came in contact with poison ivy, rinse them off with a garden hose before you bring them inside your house. Contact a doctor if:  You have open sores in the rash area.  You have more redness,  swelling, or pain in the affected area.  You have redness that spreads beyond the rash area.  You have fluid, blood, or pus coming from the affected area.  You have a fever.  You have a rash over a large area of your body.  You have a rash on your eyes, mouth, or genitals.  Your rash does not get better after a few days. Get help right away if:  Your face swells or your eyes swell shut.  You have trouble breathing.  You have trouble swallowing. This information is not intended to replace advice given to you by your health care provider. Make sure you discuss any questions you have with your health care provider. Document Released: 01/07/2011 Document Revised: 05/12/2016 Document Reviewed: 05/13/2015 Elsevier Interactive Patient Education  2018 [place]

## 2017-06-23 NOTE — Patient Instructions (Signed)
Use the prednisone as instructed.  I hope you are feeling better soon! Seek care immediately if worsening, new concerns or you are not improving with treatment.   Poison Ivy Dermatitis Poison ivy dermatitis is redness and soreness (inflammation) of the skin. It is caused by a chemical that is found on the leaves of the poison ivy plant. You may also have itching, a rash, and blisters. Symptoms often clear up in 1-2 weeks. You may get this condition by touching a poison ivy plant. You can also get it by touching something that has the chemical on it. This may include animals or objects that have come in contact with the plant. Follow these instructions at home: General instructions  Take or apply over-the-counter and prescription medicines only as told by your doctor.  If you touch poison ivy, wash your skin with soap and cold water right away.  Use hydrocortisone creams or calamine lotion as needed to help with itching.  Take oatmeal baths as needed. Use colloidal oatmeal. You can get this at a pharmacy or grocery store. Follow the instructions on the package.  Do not scratch or rub your skin.  While you have the rash, wash your clothes right after you wear them. Prevention  Know what poison ivy looks like so you can avoid it. This plant has three leaves with flowering branches on a single stem. The leaves are glossy. They have uneven edges that come to a point at the front.  If you have touched poison ivy, wash with soap and water right away. Be sure to wash under your fingernails.  When hiking or camping, wear long pants, a long-sleeved shirt, tall socks, and hiking boots. You can also use a lotion on your skin that helps to prevent contact with the chemical on the plant.  If you think that your clothes or outdoor gear came in contact with poison ivy, rinse them off with a garden hose before you bring them inside your house. Contact a doctor if:  You have open sores in the rash  area.  You have more redness, swelling, or pain in the affected area.  You have redness that spreads beyond the rash area.  You have fluid, blood, or pus coming from the affected area.  You have a fever.  You have a rash over a large area of your body.  You have a rash on your eyes, mouth, or genitals.  Your rash does not get better after a few days. Get help right away if:  Your face swells or your eyes swell shut.  You have trouble breathing.  You have trouble swallowing. This information is not intended to replace advice given to you by your health care provider. Make sure you discuss any questions you have with your health care provider. Document Released: 01/07/2011 Document Revised: 05/12/2016 Document Reviewed: 05/13/2015 Elsevier Interactive Patient Education  [employer].

## 2017-09-05 NOTE — Progress Notes (Deleted)
No chief complaint on file.   HPI: Barry Silva 56 y.o. come in for Chronic disease management  Supposed to be a cpx  ROS: See pertinent positives and negatives per HPI.  Past Medical History:  Diagnosis Date  . ADD (attention deficit disorder with hyperactivity)    formal evaluation  . 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 Migraine   . MIGRAINE HEADACHE 08/30/2007   Qualifier: Diagnosis of  By: Scherrie Gerlach    . Nephrolithiasis    hx of episode 1/10 calcium stones  . OSA (obstructive sleep apnea)    mild, Apnealink 7 2011 RDI7, O2 nadir 83%  . Refusal of blood transfusions as patient is Jehovah's Witness   . Sarcoidosis    bronchoscopy 2006, mtx started 04/19/10 try off 11/29/10 (atypical cp/ ? worsening ct chest wlh) off prednisone 7/11    Family History  Problem Relation Age of Onset  . Stroke Unknown        Grandfather father older age  . Colon cancer Neg Hx   . Esophageal cancer Neg Hx   . Rectal cancer Neg Hx   . Stomach cancer Neg Hx     Social History   Social History  . Marital status: Married    Spouse name: N/A  . Number of children: N/A  . Years of education: N/A   Social History Main Topics  . Smoking status: Never Smoker  . Smokeless tobacco: Never Used  . Alcohol use 4.8 oz/week    8 Glasses of wine per week     Comment: socially  . Drug use: No  . Sexual activity: Not on file   Other Topics Concern  . 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   Had to move ;last year and sell house because of financial difficulties in business.   Better now.    M in law died last year was in [facility] and wife caretaker.    Family in [place]   No tobacco or ETS    Outpatient Medications Prior to Visit  Medication Sig Dispense Refill  . ADDERALL XR 30 MG 24 hr capsule Take 1 capsule (30 mg total) by mouth daily. 30 capsule 0  . ADDERALL XR 30 MG 24 hr capsule Take 1  capsule (30 mg total) by mouth daily. 30 capsule 0  . ADDERALL XR 30 MG 24 hr capsule Take 1 capsule (30 mg total) by mouth daily. 30 capsule 0  . amLODipine (NORVASC) 5 MG tablet Take 1 tablet (5 mg total) by mouth daily. 30 tablet 1  . losartan (COZAAR) 100 MG tablet Take 1 tablet (100 mg total) by mouth daily. 30 tablet 1  . losartan-hydrochlorothiazide (HYZAAR) 100-12.5 MG tablet TAKE 1 TABLET BY MOUTH DAILY. 90 tablet 1  . predniSONE (DELTASONE) 10 MG tablet 5 tablets (50mg ) daily for 3 days, then 4 tablets (40 mg) daily for 3 days, then 3 tablets (30mg ) daily for 3 days, then 2 tablets (20 mg) daily for 3 days, then 1 tablet (10 mg) daily for 3 days. 45 tablet 0   No facility-administered medications prior to visit.      EXAM:  There were no vitals taken for this visit.  There is no height or weight on file to calculate BMI.  GENERAL: vitals reviewed and listed above, alert, oriented, appears  well hydrated and in no acute distress HEENT: atraumatic, conjunctiva  clear, no obvious abnormalities on inspection of external nose and ears OP : no lesion edema or exudate  NECK: no obvious masses on inspection palpation  LUNGS: clear to auscultation bilaterally, no wheezes, rales or rhonchi, good air movement CV: HRRR, no clubbing cyanosis or  peripheral edema nl cap refill  MS: moves all extremities without noticeable focal  abnormality PSYCH: pleasant and cooperative, no obvious depression or anxiety Lab Results  Component Value Date   WBC 5.6 08/16/2016   HGB 15.8 08/16/2016   HCT 45.3 08/16/2016   PLT 299.0 08/16/2016   GLUCOSE 99 08/16/2016   CHOL 222 (H) 08/16/2016   TRIG 168.0 (H) 08/16/2016   HDL 43.10 08/16/2016   LDLDIRECT 142.6 05/14/2013   LDLCALC 146 (H) 08/16/2016   ALT 24 08/16/2016   AST 19 08/16/2016   NA 138 08/16/2016   K 4.2 08/16/2016   CL 102 08/16/2016   CREATININE 0.92 08/16/2016   BUN 16 08/16/2016   CO2 31 08/16/2016   TSH 1.53 08/16/2016   PSA  1.11 08/16/2016   INR 1.04 05/07/2010   BP Readings from Last 3 Encounters:  06/23/17 122/80  03/06/17 130/90  09/06/16 (!) 138/98    ASSESSMENT AND PLAN:  Discussed the following assessment and plan:  Attention deficit hyperactivity disorder (ADHD), other type  Essential hypertension  Medication management  -Patient advised to return or notify health care team  if  new concerns arise.  There are no Patient Instructions on file for this visit.   Standley Brooking. Izabela Ow M.D.

## 2017-09-06 ENCOUNTER — Ambulatory Visit: Payer: Self-pay | Admitting: Internal Medicine

## 2017-09-06 DIAGNOSIS — Z0289 Encounter for other administrative examinations: Secondary | ICD-10-CM

## 2017-10-04 ENCOUNTER — Telehealth: Payer: Self-pay | Admitting: Internal Medicine

## 2017-10-04 MED ORDER — ADDERALL XR 30 MG PO CP24: 30.0000 mg | ORAL_CAPSULE | Freq: Every day | ORAL | 0 refills | Status: DC

## 2017-10-04 NOTE — Telephone Encounter (Signed)
He was due for PV   That was missed   He can get OV for med check or cpx  Which ever is better for him   . In interim can rx  30 days of medication

## 2017-10-04 NOTE — Telephone Encounter (Signed)
30 day refilled, printed and placed up front to pick up OV for cpx scheduled for 10/16/17 at 1:15 Nothing further needed.

## 2017-10-04 NOTE — Telephone Encounter (Signed)
Pt needs new rx generic adderall xr 30 mg °

## 2017-10-04 NOTE — Telephone Encounter (Signed)
Refill request for Medication: Adderall 30mg   Last Filled: 06/23/17, #30 x 3 rxs Previous/Upcoming Appt: No Showed 09/06/17 appt  Please advise Dr Regis Bill, thanks.

## 2017-10-13 NOTE — Progress Notes (Signed)
Chief Complaint  Patient presents with  . Annual Exam    No new concerns. Pt reports popping tendon on bicep in June    HPI: Patient  Barry Silva  56 y.o. comes in today for [place] visit  And medication management ADHD taking Adderall most days.  Denies any significant side effects states it still is very helpful for him and helps himStays on track   And quicker  Getting through   .  Tasks of the day.  Denies any mood side effects Taking blood pressure medicine most days occasional mast not taking readings stressful day given the presentation has work 8 on the run.  States that they are usually running 120 over high 80s or 90.  No side effects of the medicine is off the diuretic at this time. History of sarcoid no coughing or respiratory symptoms.  Health Maintenance  Topic Date Due  . HIV Screening  03/[birth year]/1978  . TETANUS/TDAP  09/06/2017  . INFLUENZA VACCINE  03/18/2018 (Originally 07/19/2017)  . COLONOSCOPY  05/09/2019  . 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 Would like a tetanus vaccine declined the flu vaccine GEN/ HEENT: No fever, significant weight changes sweats headaches vision problems hearing changes, CV/ PULM; No chest pain shortness of breath cough, syncope,edema  change in exercise tolerance. GI /GU: No adominal pain, vomiting, change in bowel habits. No blood in the stool. No significant GU symptoms. SKIN/HEME: ,no acute skin rashes suspicious lesions or bleeding. No lymphadenopathy, nodules, masses.  NEURO/ PSYCH:  No neurologic signs  such as weakness numbness. No depression anxiety. IMM/ Allergy: No unusual infections.  Allergy .   REST of 12 system review negative except as per HPI   Past Medical History:  Diagnosis Date  . ADD (attention deficit disorder with hyperactivity)    formal evaluation  . 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 Migraine   . MIGRAINE HEADACHE 08/30/2007   Qualifier: Diagnosis of  By: Scherrie Gerlach    . Nephrolithiasis    hx of episode 1/10 calcium stones  . OSA (obstructive sleep apnea)    mild, Apnealink 7 2011 RDI7, O2 nadir 83%  . Refusal of blood transfusions as patient is Jehovah's Witness   . Sarcoidosis    bronchoscopy 2006, mtx started 04/19/10 try off 11/29/10 (atypical cp/ ? worsening ct chest wl[facility]) off prednisone 7/11    Past Surgical History:  Procedure Laterality Date  . BRONCHOSCOPY  2006  . HERNIA REPAIR     age 39  . KIDNEY STONE SURGERY    . KNEE SURGERY  1985 and 1986    Family History  Problem Relation Age of Onset  . Stroke Unknown        Grandfather father older age  . Colon cancer Neg Hx   . Esophageal cancer Neg Hx   . Rectal cancer Neg Hx   . Stomach cancer Neg Hx     Social History   Social History  . Marital status: Married  Spouse name: N/A  . Number of children: N/A  . Years of education: N/A   Social History Main Topics  . Smoking status: Never Smoker  . Smokeless tobacco: Never Used  . Alcohol use 4.8 oz/week    8 Glasses of wine per week     Comment: socially  . Drug use: No  . Sexual activity: Not Asked   Other Topics Concern  . 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   Had to move ;last year and sell house because of financial difficulties in business.   Better now.    M in law died last year was in [facility] and wife caretaker.    Family in [place]   No tobacco or ETS    Outpatient Medications Prior to Visit  Medication Sig Dispense Refill  .  ADDERALL XR 30 MG 24 hr capsule Take 1 capsule (30 mg total) by mouth daily. 30 capsule 0  . ADDERALL XR 30 MG 24 hr capsule Take 1 capsule (30 mg total) by mouth daily. 30 capsule 0  . amLODipine (NORVASC) 5 MG tablet Take 1 tablet (5 mg total) by mouth daily. 30 tablet 1  . losartan (COZAAR) 100 MG tablet Take 1 tablet (100 mg total) by mouth daily. 30 tablet 1  . ADDERALL XR 30 MG 24 hr capsule Take 1 capsule (30 mg total) by mouth daily. 30 capsule 0  . losartan-hydrochlorothiazide (HYZAAR) 100-12.5 MG tablet TAKE 1 TABLET BY MOUTH DAILY. (Patient not taking: Reported on 10/16/2017) 90 tablet 1  . predniSONE (DELTASONE) 10 MG tablet 5 tablets (27m) daily for 3 days, then 4 tablets (40 mg) daily for 3 days, then 3 tablets (367m daily for 3 days, then 2 tablets (20 mg) daily for 3 days, then 1 tablet (10 mg) daily for 3 days. (Patient not taking: Reported on 10/16/2017) 45 tablet 0   No facility-administered medications prior to visit.      EXAM:  BP (!) 142/88   Pulse (!) 101   Ht 5' 7"  (1.702 m)   Wt 161 lb 11.2 oz (73.3 kg)   BMI 25.33 kg/m   Body mass index is 25.33 kg/m. Wt Readings from Last 3 Encounters:  10/16/17 161 lb 11.2 oz (73.3 kg)  06/23/17 159 lb 14.4 oz (72.5 kg)  03/06/17 167 lb 11.2 oz (76.1 kg)    Physical Exam: Vital signs reviewed GERVU:YEBXs a well-developed well-nourished alert cooperative    who appearsr stated age in no acute distress.  HEENT: normocephalic atraumatic , Eyes: PERRL EOM's full, conjunctiva clear, Nares: paten,t no deformity discharge or tenderness., Ears: no deformity EAC's clear TMs with normal landmarks. Mouth: clear OP, no lesions, edema.  Moist mucous membranes. Dentition in adequate repair. NECK: supple without masses, thyromegaly or bruits. CHEST/PULM:  Clear to auscultation and percussion breath sounds equal no wheeze , rales or rhonchi. No chest wall deformities or tenderness. Breast: normal by inspection . No dimpling,  discharge, masses, tenderness or discharge . CV: PMI is nondisplaced, S1 S2 no gallops, murmurs, rubs. Peripheral pulses are full without delay.No JVD .  ABDOMEN: Bowel sounds normal nontender  No guard or rebound, no hepato splenomegal no CVA tenderness.  No hernia. Extremtities:  No clubbing cyanosis or edema, no acute joint swelling or redness no focal atrophy obvious rupture of proximal biceps on the left with good range of motion normal strength  grossly. NEURO:  Oriented x3, cranial nerves 3-12 appear to be intact, no obvious focal weakness,gait within normal limits no abnormal reflexes or asymmetrical SKIN: No acute rashes normal turgor, color, no bruising or petechiae. PSYCH: Oriented, good eye contact, no obvious depression anxiety, cognition and judgment appear normal. LN: no cervical axillary iadenopathy  Lab Results  Component Value Date   WBC 5.6 08/16/2016   HGB 15.8 08/16/2016   HCT 45.3 08/16/2016   PLT 299.0 08/16/2016   GLUCOSE 99 08/16/2016   CHOL 222 (H) 08/16/2016   TRIG 168.0 (H) 08/16/2016   HDL 43.10 08/16/2016   LDLDIRECT 142.6 05/14/2013   LDLCALC 146 (H) 08/16/2016   ALT 24 08/16/2016   AST 19 08/16/2016   NA 138 08/16/2016   K 4.2 08/16/2016   CL 102 08/16/2016   CREATININE 0.92 08/16/2016   BUN 16 08/16/2016   CO2 31 08/16/2016   TSH 1.53 08/16/2016   PSA 1.11 08/16/2016   INR 1.04 05/07/2010    BP Readings from Last 3 Encounters:  10/16/17 (!) 142/88  06/23/17 122/80  03/06/17 130/90   Wt Readings from Last 3 Encounters:  10/16/17 161 lb 11.2 oz (73.3 kg)  06/23/17 159 lb 14.4 oz (72.5 kg)  03/06/17 167 lb 11.2 oz (76.1 kg)     Lab results reviewed with patient   ASSESSMENT AND PLAN:  Discussed the following assessment and plan:  Visit for preventive health examination - Plan: Lipid panel, Basic metabolic panel, Hepatic function panel  Medication management - Plan: Lipid panel, Basic metabolic panel, Hepatic function  panel  Essential hypertension - Plan: Lipid panel, Basic metabolic panel, Hepatic function panel  Hyperlipidemia, unspecified hyperlipidemia type - Plan: Lipid panel, Basic metabolic panel, Hepatic function panel  Attention deficit hyperactivity disorder (ADHD), other type - Plan: Lipid panel, Basic metabolic panel, Hepatic function panel  Need for Td vaccine  Rupture of left proximal biceps tendon, sequela  Influenza vaccination declined by patient Discussed blood pressure control and adherence to make sure he is taking it every day even on busy days.  Follow-up in 3 months.  Consideration of increasing the amlodipine to 10 mg. Does have somewhat of a white coat effect. Benefit more than risk of the Adderall at this time. Discussed risk benefit of flu vaccine okay for tetanus update today. Shared Decision Making Will get psa for next year .  Patient Care Team: Panosh, Standley Brooking, MD as PCP - Shirlean Kelly, MD (Pulmonary Disease) Patient Instructions  Your blp needs to be  At goal .  Send in readings   When can take in relaxed atmosphere.   Stay on losartan and amlodipine and can increase to  10 mg per day if we need to   Get better control.     Labs today  [name] in 3-6 mos depending on bp readings    Preventive Care 40-64 Years, Male Preventive care refers to lifestyle choices and visits with your health care provider that can promote health and wellness. What does preventive care include?  A yearly physical exam. This is also called an annual well check.  Dental exams once or twice a year.  Routine eye exams. Ask your health care provider how often you should have your eyes checked.  Personal lifestyle choices, including: ? Daily care of your teeth and gums. ? Regular physical activity. ? Eating a healthy diet. ? Avoiding tobacco and drug use. ? Limiting alcohol use. ? Practicing safe sex. ? Taking low-dose aspirin every  day starting at age 72. What happens  during an annual well check? The services and screenings done by your health care provider during your annual well check will depend on your age, overall health, lifestyle risk factors, and family history of disease. Counseling Your health care provider may ask you questions about your:  Alcohol use.  Tobacco use.  Drug use.  Emotional well-being.  Home and relationship well-being.  Sexual activity.  Eating habits.  Work and work Statistician.  Screening You may have the following tests or measurements:  Height, weight, and BMI.  Blood pressure.  Lipid and cholesterol levels. These may be checked every 5 years, or more frequently if you are over 45 years old.  Skin check.  Lung cancer screening. You may have this screening every year starting at age 75 if you have a 30-pack-year history of smoking and currently smoke or have quit within the past 15 years.  Fecal occult blood test (FOBT) of the stool. You may have this test every year starting at age 51.  Flexible sigmoidoscopy or colonoscopy. You may have a sigmoidoscopy every 5 years or a colonoscopy every 10 years starting at age 58.  Prostate cancer screening. Recommendations will vary depending on your family history and other risks.  Hepatitis C blood test.  Hepatitis B blood test.  Sexually transmitted disease (STD) testing.  Diabetes screening. This is done by checking your blood sugar (glucose) after you have not eaten for a while (fasting). You may have this done every 1-3 years.  Discuss your test results, treatment options, and if necessary, the need for more tests with your health care provider. Vaccines Your health care provider may recommend certain vaccines, such as:  Influenza vaccine. This is recommended every year.  Tetanus, diphtheria, and acellular pertussis (Tdap, Td) vaccine. You may need a Td booster every 10 years.  Varicella vaccine. You may need this if you have not been  vaccinated.  Zoster vaccine. You may need this after age 41.  Measles, mumps, and rubella (MMR) vaccine. You may need at least one dose of MMR if you were born in 1957 or later. You may also need a second dose.  Pneumococcal 13-valent conjugate (PCV13) vaccine. You may need this if you have certain conditions and have not been vaccinated.  Pneumococcal polysaccharide (PPSV23) vaccine. You may need one or two doses if you smoke cigarettes or if you have certain conditions.  Meningococcal vaccine. You may need this if you have certain conditions.  Hepatitis A vaccine. You may need this if you have certain conditions or if you travel or work in places where you may be exposed to hepatitis A.  Hepatitis B vaccine. You may need this if you have certain conditions or if you travel or work in places where you may be exposed to hepatitis B.  Haemophilus influenzae type b (Hib) vaccine. You may need this if you have certain risk factors.  Talk to your health care provider about which screenings and vaccines you need and how often you need them. This information is not intended to replace advice given to you by your health care provider. Make sure you discuss any questions you have with your health care provider. Document Released: 01/01/2016 Document Revised: 08/24/2016 Document Reviewed: 10/06/2015 Elsevier Interactive Patient Education  2017 [place]son K. Panosh M.D.

## 2017-10-16 ENCOUNTER — Encounter: Payer: Self-pay | Admitting: Internal Medicine

## 2017-10-16 ENCOUNTER — Ambulatory Visit (INDEPENDENT_AMBULATORY_CARE_PROVIDER_SITE_OTHER): Payer: PRIVATE HEALTH INSURANCE | Admitting: Internal Medicine

## 2017-10-16 VITALS — BP 142/88 | HR 101 | Ht 67.0 in | Wt 161.7 lb

## 2017-10-16 DIAGNOSIS — Z2821 Immunization not carried out because of patient refusal: Secondary | ICD-10-CM

## 2017-10-16 DIAGNOSIS — S46212S Strain of muscle, fascia and tendon of other parts of biceps, left arm, sequela: Secondary | ICD-10-CM

## 2017-10-16 DIAGNOSIS — Z79899 Other long term (current) drug therapy: Secondary | ICD-10-CM | POA: Diagnosis not present

## 2017-10-16 DIAGNOSIS — I10 Essential (primary) hypertension: Secondary | ICD-10-CM

## 2017-10-16 DIAGNOSIS — Z0000 Encounter for general adult medical examination without abnormal findings: Secondary | ICD-10-CM

## 2017-10-16 DIAGNOSIS — Z23 Encounter for immunization: Secondary | ICD-10-CM | POA: Diagnosis not present

## 2017-10-16 DIAGNOSIS — F908 Attention-deficit hyperactivity disorder, other type: Secondary | ICD-10-CM | POA: Diagnosis not present

## 2017-10-16 DIAGNOSIS — E785 Hyperlipidemia, unspecified: Secondary | ICD-10-CM | POA: Diagnosis not present

## 2017-10-16 DIAGNOSIS — S46119A Strain of muscle, fascia and tendon of long head of biceps, unspecified arm, initial encounter: Secondary | ICD-10-CM | POA: Insufficient documentation

## 2017-10-16 LAB — HEPATIC FUNCTION PANEL
ALT: 38 U/L (ref 0–53)
AST: 25 U/L (ref 0–37)
Albumin: 4.3 g/dL (ref 3.5–5.2)
Alkaline Phosphatase: 82 U/L (ref 39–117)
BILIRUBIN DIRECT: 0.2 mg/dL (ref 0.0–0.3)
Total Bilirubin: 1.7 mg/dL — ABNORMAL HIGH (ref 0.2–1.2)
Total Protein: 6.6 g/dL (ref 6.0–8.3)

## 2017-10-16 LAB — BASIC METABOLIC PANEL
BUN: 11 mg/dL (ref 6–23)
CALCIUM: 9.4 mg/dL (ref 8.4–10.5)
CO2: 30 meq/L (ref 19–32)
Chloride: 100 mEq/L (ref 96–112)
Creatinine, Ser: 0.87 mg/dL (ref 0.40–1.50)
GFR: 96.98 mL/min (ref >60.00)
Glucose, Bld: 164 mg/dL — ABNORMAL HIGH (ref 70–99)
Potassium: 3.7 mEq/L (ref 3.5–5.1)
Sodium: 138 mEq/L (ref 135–145)

## 2017-10-16 LAB — LIPID PANEL
Cholesterol: 217 mg/dL — ABNORMAL HIGH (ref 0–200)
HDL: 52.6 mg/dL (ref >39.00)
LDL CALC: 135 mg/dL — AB (ref 0–99)
NONHDL: 164
Total CHOL/HDL Ratio: 4
Triglycerides: 145 mg/dL (ref 0.0–149.0)
VLDL: 29 mg/dL (ref 0.0–40.0)

## 2017-10-16 MED ORDER — ADDERALL XR 30 MG PO CP24
30.0000 mg | ORAL_CAPSULE | Freq: Every day | ORAL | 0 refills | Status: DC
Start: 2017-10-16 — End: 2017-12-06

## 2017-10-16 NOTE — Patient Instructions (Addendum)
Your blp needs to be  At goal .  Send in readings   When can take in relaxed atmosphere.   Stay on losartan and amlodipine and can increase to  10 mg per day if we need to   Get better control.     Labs today  [name] in 3-6 mos depending on bp readings    Preventive Care 40-64 Years, Male Preventive care refers to lifestyle choices and visits with your health care provider that can promote health and wellness. What does preventive care include?  A yearly physical exam. This is also called an annual well check.  Dental exams once or twice a year.  Routine eye exams. Ask your health care provider how often you should have your eyes checked.  Personal lifestyle choices, including: ? Daily care of your teeth and gums. ? Regular physical activity. ? Eating a healthy diet. ? Avoiding tobacco and drug use. ? Limiting alcohol use. ? Practicing safe sex. ? Taking low-dose aspirin every day starting at age 37. What happens during an annual well check? The services and screenings done by your health care provider during your annual well check will depend on your age, overall health, lifestyle risk factors, and family history of disease. Counseling Your health care provider may ask you questions about your:  Alcohol use.  Tobacco use.  Drug use.  Emotional well-being.  Home and relationship well-being.  Sexual activity.  Eating habits.  Work and work Statistician.  Screening You may have the following tests or measurements:  Height, weight, and BMI.  Blood pressure.  Lipid and cholesterol levels. These may be checked every 5 years, or more frequently if you are over 102 years old.  Skin check.  Lung cancer screening. You may have this screening every year starting at age 50 if you have a 30-pack-year history of smoking and currently smoke or have quit within the past 15 years.  Fecal occult blood test (FOBT) of the stool. You may have this test every year starting at age  74.  Flexible sigmoidoscopy or colonoscopy. You may have a sigmoidoscopy every 5 years or a colonoscopy every 10 years starting at age 32.  Prostate cancer screening. Recommendations will vary depending on your family history and other risks.  Hepatitis C blood test.  Hepatitis B blood test.  Sexually transmitted disease (STD) testing.  Diabetes screening. This is done by checking your blood sugar (glucose) after you have not eaten for a while (fasting). You may have this done every 1-3 years.  Discuss your test results, treatment options, and if necessary, the need for more tests with your health care provider. Vaccines Your health care provider may recommend certain vaccines, such as:  Influenza vaccine. This is recommended every year.  Tetanus, diphtheria, and acellular pertussis (Tdap, Td) vaccine. You may need a Td booster every 10 years.  Varicella vaccine. You may need this if you have not been vaccinated.  Zoster vaccine. You may need this after age 43.  Measles, mumps, and rubella (MMR) vaccine. You may need at least one dose of MMR if you were born in 1957 or later. You may also need a second dose.  Pneumococcal 13-valent conjugate (PCV13) vaccine. You may need this if you have certain conditions and have not been vaccinated.  Pneumococcal polysaccharide (PPSV23) vaccine. You may need one or two doses if you smoke cigarettes or if you have certain conditions.  Meningococcal vaccine. You may need this if you have certain conditions.  Hepatitis A vaccine. You may need this if you have certain conditions or if you travel or work in places where you may be exposed to hepatitis A.  Hepatitis B vaccine. You may need this if you have certain conditions or if you travel or work in places where you may be exposed to hepatitis B.  Haemophilus influenzae type b (Hib) vaccine. You may need this if you have certain risk factors.  Talk to your health care provider about which  screenings and vaccines you need and how often you need them. This information is not intended to replace advice given to you by your health care provider. Make sure you discuss any questions you have with your health care provider. Document Released: 01/01/2016 Document Revised: 08/24/2016 Document Reviewed: 10/06/2015 Elsevier Interactive Patient Education  2017 [employer].

## 2017-10-18 ENCOUNTER — Telehealth: Payer: Self-pay | Admitting: Internal Medicine

## 2017-10-24 NOTE — Telephone Encounter (Signed)
Refilled 10/20/17 Nothing further needed.

## 2017-10-25 ENCOUNTER — Other Ambulatory Visit: Payer: Self-pay | Admitting: Internal Medicine

## 2017-10-25 DIAGNOSIS — R739 Hyperglycemia, unspecified: Secondary | ICD-10-CM

## 2017-10-27 ENCOUNTER — Other Ambulatory Visit (INDEPENDENT_AMBULATORY_CARE_PROVIDER_SITE_OTHER): Payer: PRIVATE HEALTH INSURANCE

## 2017-10-27 DIAGNOSIS — R739 Hyperglycemia, unspecified: Secondary | ICD-10-CM | POA: Diagnosis not present

## 2017-10-27 LAB — HEMOGLOBIN A1C: Hgb A1c MFr Bld: 5.1 % (ref 4.6–6.5)

## 2017-10-27 LAB — GLUCOSE, POCT (MANUAL RESULT ENTRY): POC GLUCOSE: 88 mg/dL (ref 70–99)

## 2017-10-31 ENCOUNTER — Telehealth: Payer: Self-pay | Admitting: Internal Medicine

## 2017-10-31 NOTE — Telephone Encounter (Signed)
Pt given results of glucose and Hgb A1C

## 2017-12-05 ENCOUNTER — Other Ambulatory Visit: Payer: Self-pay | Admitting: Internal Medicine

## 2017-12-05 NOTE — Telephone Encounter (Signed)
Adderall refill. Last OV and refill on 10/16/17.

## 2017-12-05 NOTE — Telephone Encounter (Signed)
Copied from [place]. Topic: Quick Communication - See Telephone Encounter >> Dec 05, 2017 11:26 AM Bea Graff, NT wrote: CRM for notification. See Telephone encounter for: Needs refill of Adderall.  12/05/17.

## 2017-12-06 ENCOUNTER — Telehealth: Payer: Self-pay | Admitting: Family Medicine

## 2017-12-06 MED ORDER — ADDERALL XR 30 MG PO CP24: 30.0000 mg | ORAL_CAPSULE | Freq: Every day | ORAL | 0 refills | Status: DC

## 2017-12-06 NOTE — Telephone Encounter (Signed)
Done

## 2017-12-06 NOTE — Telephone Encounter (Signed)
Adderall last filled 10/16/17, #30 Last seen 10/16/17 Please advise Dr Sarajane Jews if able to authorize refill of this medication in Dr Velora Mediate absence. Thanks.

## 2017-12-07 NOTE — Telephone Encounter (Signed)
Pharmacy calling to ask if dr can change/resend his Rx to generic adderral. Just say "generic substitution permitted". Pt is cash pay and pt states this id ok for him because cheaper.

## 2017-12-07 NOTE — Telephone Encounter (Signed)
Dr Sarajane Jews please advise if able to re-send as generic. Thanks.

## 2017-12-08 MED ORDER — AMPHETAMINE-DEXTROAMPHET ER 30 MG PO CP24: 30.0000 mg | ORAL_CAPSULE | ORAL | 0 refills | Status: DC

## 2017-12-08 NOTE — Telephone Encounter (Signed)
Pt aware via voicemail that Rx has been filled.  Nothing further needed.

## 2017-12-08 NOTE — Addendum Note (Signed)
Addended by: Alysia Penna A on: 12/08/2017 05:07 PM   Modules accepted: Orders

## 2017-12-08 NOTE — Telephone Encounter (Signed)
The generic was sent in

## 2017-12-17 ENCOUNTER — Other Ambulatory Visit: Payer: Self-pay | Admitting: Internal Medicine

## 2018-01-10 ENCOUNTER — Other Ambulatory Visit: Payer: Self-pay | Admitting: Internal Medicine

## 2018-01-10 MED ORDER — ADDERALL XR 30 MG PO CP24: 30.0000 mg | ORAL_CAPSULE | Freq: Every day | ORAL | 0 refills | Status: DC

## 2018-01-10 NOTE — Telephone Encounter (Signed)
Last filled 06/23/18, #30 x 2 rx's printed Last seen 10/16/17 Please advise Dr Regis Bill, thanks.

## 2018-01-10 NOTE — Telephone Encounter (Signed)
Sent  To pharmacy  Of record [address]

## 2018-01-10 NOTE — Telephone Encounter (Signed)
Copied from [place]. Topic: Quick Communication - See Telephone Encounter >> Jan 10, 2018 12:16 PM Vernona Rieger wrote: CRM for notification. See Telephone encounter for:   01/10/18.  ADDERALL XR 30 MG 24 hr capsule  Call when ready for pick up [phone]

## 2018-01-15 NOTE — Telephone Encounter (Signed)
Pt aware meds refilled.  States that they were filled to the wrong pharmacy but he went ahead and picked this up.  Chart updated and [place] removed from chart.  Nothing further needed.

## 2018-01-21 NOTE — Progress Notes (Signed)
Chief Complaint  Patient presents with  . Follow-up    Pt states that he feels the BP meds are helping to keep his readings steady. Range is 130s/90s. Pt has current sinus infection x 3 days, taking OTC decongestant/antihistamine.    HPI: Barry Silva 57 y.o. come in for med eval  management  But also has sinus problem    Sinus  Saturday  Feb 2   Yellow  Drainage .  Travel in [place] and tried Human resources officer D>   Bad headache  Face and top of head .   Some help but continuing No fever .   But felt feverish  Onset  Flu like .    Had vertigo 3 weeks ago nl now    Bp seems ok    stady .   130/90  othewise 120 and low  No se of med amlodipine 5  and  Losartan 100   adhd     On 30 mg    Want to try decreasing dose as discussed in past  20 mg XR at next refill  ROS: See pertinent positives and negatives per HPI. No cp sob has cough with this illness  Past Medical History:  Diagnosis Date  . ADD (attention deficit disorder with hyperactivity)    formal evaluation  . 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 Migraine   . MIGRAINE HEADACHE 08/30/2007   Qualifier: Diagnosis of  By: Scherrie Gerlach    . Nephrolithiasis    hx of episode 1/10 calcium stones  . OSA (obstructive sleep apnea)    mild, Apnealink 7 2011 RDI7, O2 nadir 83%  . Refusal of blood transfusions as patient is Jehovah's Witness   . Sarcoidosis    bronchoscopy 2006, mtx started 04/19/10 try off 11/29/10 (atypical cp/ ? worsening ct chest [facility]) off prednisone 7/11    Family History  Problem Relation Age of Onset  . Stroke Unknown        Grandfather father older age  . Colon cancer Neg Hx   . Esophageal cancer Neg Hx   . Rectal cancer Neg Hx   . Stomach cancer Neg Hx     Social History   Socioeconomic History  . Marital status: Married    Spouse name: None  . Number of children: None  . Years of education: None  . Highest education level: None  Social Needs  . Financial resource strain: None    . Food insecurity - worry: None  . Food insecurity - inability: None  . Transportation needs - medical: None  . Transportation needs - non-medical: None  Occupational History  . None  Tobacco Use  . Smoking status: Never Smoker  . Smokeless tobacco: Never Used  Substance and Sexual Activity  . Alcohol use: Yes    Alcohol/week: 4.8 oz    Types: 8 Glasses of wine per week    Comment: socially  . Drug use: No  . Sexual activity: None  Other Topics Concern  . None  Social History Narrative   Married   HH of 2   Works  owns [employer]  Active physically .   Had to move ;last year and sell house because of financial difficulties in business.   Better now.    M in law died last year was in [facility] and wife caretaker.    Family in [place]   No tobacco or ETS    Outpatient Medications Prior  to Visit  Medication Sig Dispense Refill  . ADDERALL XR 30 MG 24 hr capsule Take 1 capsule (30 mg total) by mouth daily. 30 capsule 0  . ADDERALL XR 30 MG 24 hr capsule Take 1 capsule (30 mg total) by mouth daily. 30 capsule 0  . amLODipine (NORVASC) 5 MG tablet TAKE 1 TABLET BY MOUTH EVERY DAY 30 tablet 3  . amphetamine-dextroamphetamine (ADDERALL XR) 30 MG 24 hr capsule Take 1 capsule (30 mg total) by mouth every morning. 30 capsule 0  . losartan (COZAAR) 100 MG tablet TAKE 1 TABLET BY MOUTH EVERY DAY 30 tablet 3   No facility-administered medications prior to visit.      EXAM:  BP 118/82 (BP Location: Right Arm, Patient Position: Sitting, Cuff Size: Normal)   Pulse 97   Temp 98.7 F (37.1 C) (Oral)   Wt 162 lb (73.5 kg)   BMI 25.37 kg/m   Body mass index is 25.37 kg/m.  GENERAL: vitals reviewed and listed above, alert, oriented, appears well hydrated and in no acute distress very congested  ocass cough  HEENT: atraumatic, conjunctiva  clear, no obvious abnormalities on inspection of external nose and ears tm intact face mild tender maxilla area OP : no lesion edema or exudate  NECK:  no obvious masses on inspection palpation  LUNGS: clear to auscultation bilaterally, no wheezes, rales or rhonchi, good air movement CV: HRRR, no clubbing cyanosis or  peripheral edema nl cap refill  MS: moves all extremities without noticeable focal  abnormality PSYCH: pleasant and cooperative, no obvious depression or anxiety Lab Results  Component Value Date   WBC 5.6 08/16/2016   HGB 15.8 08/16/2016   HCT 45.3 08/16/2016   PLT 299.0 08/16/2016   GLUCOSE 164 (H) 10/16/2017   CHOL 217 (H) 10/16/2017   TRIG 145.0 10/16/2017   HDL 52.60 10/16/2017   LDLDIRECT 142.6 05/14/2013   LDLCALC 135 (H) 10/16/2017   ALT 38 10/16/2017   AST 25 10/16/2017   NA 138 10/16/2017   K 3.7 10/16/2017   CL 100 10/16/2017   CREATININE 0.87 10/16/2017   BUN 11 10/16/2017   CO2 30 10/16/2017   TSH 1.53 08/16/2016   PSA 1.11 08/16/2016   INR 1.04 05/07/2010   HGBA1C 5.1 10/27/2017   BP Readings from Last 3 Encounters:  01/22/18 118/82  10/16/17 (!) 142/88  06/23/17 122/80   Wt Readings from Last 3 Encounters:  01/22/18 162 lb (73.5 kg)  10/16/17 161 lb 11.2 oz (73.3 kg)  06/23/17 159 lb 14.4 oz (72.5 kg)     ASSESSMENT AND PLAN:  Discussed the following assessment and plan:  Essential hypertension  Medication management  Attention deficit hyperactivity disorder (ADHD), other type  Acute non-recurrent sinusitis, unspecified location - local measures  see advice    -Patient advised to return or notify health care team  if  new concerns arise.  Patient Instructions  Contact us  When you need refill and we can try a lower  Dose adderall XR  20 mg   Can try mucinex   Dm for cough suppression .   And saline nose spray .  And afrin nose spray if needed for night   fo only 3 nights to avoid   Rebound congestion . Can add  flonase or nasacort   .   Daily also to decrease congestion .   If not  Improving in the next 2-3 days can add antibiotic   For sinusitis  Treatment  Sinusitis, Adult Sinusitis is soreness and inflammation of your sinuses. Sinuses are hollow spaces in the bones around your face. Your sinuses are located:  Around your eyes.  In the middle of your forehead.  Behind your nose.  In your cheekbones.  Your sinuses and nasal passages are lined with a stringy fluid (mucus). Mucus normally drains out of your sinuses. When your nasal tissues become inflamed or swollen, the mucus can become trapped or blocked so air cannot flow through your sinuses. This allows bacteria, viruses, and funguses to grow, which leads to infection. Sinusitis can develop quickly and last for 7?10 days (acute) or for more than 12 weeks (chronic). Sinusitis often develops after a cold. What are the causes? This condition is caused by anything that creates swelling in the sinuses or stops mucus from draining, including:  Allergies.  Asthma.  Bacterial or viral infection.  Abnormally shaped bones between the nasal passages.  Nasal growths that contain mucus (nasal polyps).  Narrow sinus openings.  Pollutants, such as chemicals or irritants in the air.  A foreign object stuck in the nose.  A fungal infection. This is rare.  What increases the risk? The following factors may make you more likely to develop this condition:  Having allergies or asthma.  Having had a recent cold or respiratory tract infection.  Having structural deformities or blockages in your nose or sinuses.  Having a weak immune system.  Doing a lot of swimming or diving.  Overusing nasal sprays.  Smoking.  What are the signs or symptoms? The main symptoms of this condition are pain and a feeling of pressure around the affected sinuses. Other symptoms include:  Upper toothache.  Earache.  Headache.  Bad breath.  Decreased sense of smell and taste.  A cough that may get worse at night.  Fatigue.  Fever.  Thick drainage from your nose. The drainage is often  green and it may contain pus (purulent).  Stuffy nose or congestion.  Postnasal drip. This is when extra mucus collects in the throat or back of the nose.  Swelling and warmth over the affected sinuses.  Sore throat.  Sensitivity to light.  How is this diagnosed? This condition is diagnosed based on symptoms, a medical history, and a physical exam. To find out if your condition is acute or chronic, your health care provider may:  Look in your nose for signs of nasal polyps.  Tap over the affected sinus to check for signs of infection.  View the inside of your sinuses using an imaging device that has a light attached (endoscope).  If your health care provider suspects that you have chronic sinusitis, you may also:  Be tested for allergies.  Have a sample of mucus taken from your nose (nasal culture) and checked for bacteria.  Have a mucus sample examined to see if your sinusitis is related to an allergy.  If your sinusitis does not respond to treatment and it lasts longer than 8 weeks, you may have an MRI or CT scan to check your sinuses. These scans also help to determine how severe your infection is. In rare cases, a bone biopsy may be done to rule out more serious types of fungal sinus disease. How is this treated? Treatment for sinusitis depends on the cause and whether your condition is chronic or acute. If a virus is causing your sinusitis, your symptoms will go away on their own within 10 days. You may be given medicines to relieve your symptoms,  including:  Topical nasal decongestants. They shrink swollen nasal passages and let mucus drain from your sinuses.  Antihistamines. These drugs block inflammation that is triggered by allergies. This can help to ease swelling in your nose and sinuses.  Topical nasal corticosteroids. These are nasal sprays that ease inflammation and swelling in your nose and sinuses.  Nasal saline washes. These rinses can help to get rid of thick  mucus in your nose.  If your condition is caused by bacteria, you will be given an antibiotic medicine. If your condition is caused by a fungus, you will be given an antifungal medicine. Surgery may be needed to correct underlying conditions, such as narrow nasal passages. Surgery may also be needed to remove polyps. Follow these instructions at home: Medicines  Take, use, or apply over-the-counter and prescription medicines only as told by your health care provider. These may include nasal sprays.  If you were prescribed an antibiotic medicine, take it as told by your health care provider. Do not stop taking the antibiotic even if you start to feel better. Hydrate and Humidify  Drink enough water to keep your urine clear or pale yellow. Staying hydrated will help to thin your mucus.  Use a cool mist humidifier to keep the humidity level in your home above 50%.  Inhale steam for 10-15 minutes, 3-4 times a day or as told by your health care provider. You can do this in the bathroom while a hot shower is running.  Limit your exposure to cool or dry air. Rest  Rest as much as possible.  Sleep with your head raised (elevated).  Make sure to get enough sleep each night. General instructions  Apply a warm, moist washcloth to your face 3-4 times a day or as told by your health care provider. This will help with discomfort.  Wash your hands often with soap and water to reduce your exposure to viruses and other germs. If soap and water are not available, use hand sanitizer.  Do not smoke. Avoid being around people who are smoking (secondhand smoke).  Keep all follow-up visits as told by your health care provider. This is important. Contact a health care provider if:  You have a fever.  Your symptoms get worse.  Your symptoms do not improve within 10 days. Get help right away if:  You have a severe headache.  You have persistent vomiting.  You have pain or swelling around your  face or eyes.  You have vision problems.  You develop confusion.  Your neck is stiff.  You have trouble breathing. This information is not intended to replace advice given to you by your health care provider. Make sure you discuss any questions you have with your health care provider. Document Released: 12/05/2005 Document Revised: 07/31/2016 Document Reviewed: 09/30/2015 Elsevier Interactive Patient Education  2018 [place]. Sanjuanita Condrey M.D.

## 2018-01-22 ENCOUNTER — Ambulatory Visit (INDEPENDENT_AMBULATORY_CARE_PROVIDER_SITE_OTHER): Payer: PRIVATE HEALTH INSURANCE | Admitting: Internal Medicine

## 2018-01-22 ENCOUNTER — Encounter: Payer: Self-pay | Admitting: Internal Medicine

## 2018-01-22 VITALS — BP 118/82 | HR 97 | Temp 98.7°F | Wt 162.0 lb

## 2018-01-22 DIAGNOSIS — I10 Essential (primary) hypertension: Secondary | ICD-10-CM | POA: Diagnosis not present

## 2018-01-22 DIAGNOSIS — F908 Attention-deficit hyperactivity disorder, other type: Secondary | ICD-10-CM | POA: Diagnosis not present

## 2018-01-22 DIAGNOSIS — Z79899 Other long term (current) drug therapy: Secondary | ICD-10-CM

## 2018-01-22 DIAGNOSIS — J019 Acute sinusitis, unspecified: Secondary | ICD-10-CM | POA: Diagnosis not present

## 2018-01-22 MED ORDER — AMOXICILLIN 500 MG PO CAPS: 500.0000 mg | ORAL_CAPSULE | Freq: Three times a day (TID) | ORAL | 0 refills | Status: DC

## 2018-01-22 NOTE — Patient Instructions (Addendum)
Contact us  When you need refill and we can try a lower  Dose adderall XR  20 mg   Can try mucinex   Dm for cough suppression .   And saline nose spray .  And afrin nose spray if needed for night   fo only 3 nights to avoid   Rebound congestion . Can add  flonase or nasacort   .   Daily also to decrease congestion .   If not  Improving in the next 2-3 days can add antibiotic   For sinusitis  Treatment      Sinusitis, Adult Sinusitis is soreness and inflammation of your sinuses. Sinuses are hollow spaces in the bones around your face. Your sinuses are located:  Around your eyes.  In the middle of your forehead.  Behind your nose.  In your cheekbones.  Your sinuses and nasal passages are lined with a stringy fluid (mucus). Mucus normally drains out of your sinuses. When your nasal tissues become inflamed or swollen, the mucus can become trapped or blocked so air cannot flow through your sinuses. This allows bacteria, viruses, and funguses to grow, which leads to infection. Sinusitis can develop quickly and last for 7?10 days (acute) or for more than 12 weeks (chronic). Sinusitis often develops after a cold. What are the causes? This condition is caused by anything that creates swelling in the sinuses or stops mucus from draining, including:  Allergies.  Asthma.  Bacterial or viral infection.  Abnormally shaped bones between the nasal passages.  Nasal growths that contain mucus (nasal polyps).  Narrow sinus openings.  Pollutants, such as chemicals or irritants in the air.  A foreign object stuck in the nose.  A fungal infection. This is rare.  What increases the risk? The following factors may make you more likely to develop this condition:  Having allergies or asthma.  Having had a recent cold or respiratory tract infection.  Having structural deformities or blockages in your nose or sinuses.  Having a weak immune system.  Doing a lot of swimming or  diving.  Overusing nasal sprays.  Smoking.  What are the signs or symptoms? The main symptoms of this condition are pain and a feeling of pressure around the affected sinuses. Other symptoms include:  Upper toothache.  Earache.  Headache.  Bad breath.  Decreased sense of smell and taste.  A cough that may get worse at night.  Fatigue.  Fever.  Thick drainage from your nose. The drainage is often green and it may contain pus (purulent).  Stuffy nose or congestion.  Postnasal drip. This is when extra mucus collects in the throat or back of the nose.  Swelling and warmth over the affected sinuses.  Sore throat.  Sensitivity to light.  How is this diagnosed? This condition is diagnosed based on symptoms, a medical history, and a physical exam. To find out if your condition is acute or chronic, your health care provider may:  Look in your nose for signs of nasal polyps.  Tap over the affected sinus to check for signs of infection.  View the inside of your sinuses using an imaging device that has a light attached (endoscope).  If your health care provider suspects that you have chronic sinusitis, you may also:  Be tested for allergies.  Have a sample of mucus taken from your nose (nasal culture) and checked for bacteria.  Have a mucus sample examined to see if your sinusitis is related to an allergy.  If your sinusitis does not respond to treatment and it lasts longer than 8 weeks, you may have an MRI or CT scan to check your sinuses. These scans also help to determine how severe your infection is. In rare cases, a bone biopsy may be done to rule out more serious types of fungal sinus disease. How is this treated? Treatment for sinusitis depends on the cause and whether your condition is chronic or acute. If a virus is causing your sinusitis, your symptoms will go away on their own within 10 days. You may be given medicines to relieve your symptoms,  including:  Topical nasal decongestants. They shrink swollen nasal passages and let mucus drain from your sinuses.  Antihistamines. These drugs block inflammation that is triggered by allergies. This can help to ease swelling in your nose and sinuses.  Topical nasal corticosteroids. These are nasal sprays that ease inflammation and swelling in your nose and sinuses.  Nasal saline washes. These rinses can help to get rid of thick mucus in your nose.  If your condition is caused by bacteria, you will be given an antibiotic medicine. If your condition is caused by a fungus, you will be given an antifungal medicine. Surgery may be needed to correct underlying conditions, such as narrow nasal passages. Surgery may also be needed to remove polyps. Follow these instructions at home: Medicines  Take, use, or apply over-the-counter and prescription medicines only as told by your health care provider. These may include nasal sprays.  If you were prescribed an antibiotic medicine, take it as told by your health care provider. Do not stop taking the antibiotic even if you start to feel better. Hydrate and Humidify  Drink enough water to keep your urine clear or pale yellow. Staying hydrated will help to thin your mucus.  Use a cool mist humidifier to keep the humidity level in your home above 50%.  Inhale steam for 10-15 minutes, 3-4 times a day or as told by your health care provider. You can do this in the bathroom while a hot shower is running.  Limit your exposure to cool or dry air. Rest  Rest as much as possible.  Sleep with your head raised (elevated).  Make sure to get enough sleep each night. General instructions  Apply a warm, moist washcloth to your face 3-4 times a day or as told by your health care provider. This will help with discomfort.  Wash your hands often with soap and water to reduce your exposure to viruses and other germs. If soap and water are not available, use hand  sanitizer.  Do not smoke. Avoid being around people who are smoking (secondhand smoke).  Keep all follow-up visits as told by your health care provider. This is important. Contact a health care provider if:  You have a fever.  Your symptoms get worse.  Your symptoms do not improve within 10 days. Get help right away if:  You have a severe headache.  You have persistent vomiting.  You have pain or swelling around your face or eyes.  You have vision problems.  You develop confusion.  Your neck is stiff.  You have trouble breathing. This information is not intended to replace advice given to you by your health care provider. Make sure you discuss any questions you have with your health care provider. Document Released: 12/05/2005 Document Revised: 07/31/2016 Document Reviewed: 09/30/2015 Elsevier Interactive Patient Education  [employer].

## 2018-02-15 ENCOUNTER — Other Ambulatory Visit: Payer: Self-pay | Admitting: Internal Medicine

## 2018-02-15 NOTE — Telephone Encounter (Signed)
Copied from [place] [phone]. Topic: Quick Communication - Rx Refill/Question >> Feb 15, 2018  3:55 PM Percell Belt A wrote: Medication: ADDERALL XR 30 MG 24 hr capsule [[id number]] --PT STATE THAT THIS MEDS WAS REDUCED TO 20MG  .  He would like Adderall XR 20mg    Has the patient contacted their pharmacy? No    (Agent: If no, request that the patient contact the pharmacy for the refill.)   Preferred Pharmacy (with phone number [facility] street [facility] [address]    Agent: Please be advised that RX refills may take up to 3 business days. We ask that you follow-up with your pharmacy.

## 2018-02-16 MED ORDER — AMPHETAMINE-DEXTROAMPHET ER 20 MG PO CP24: 20.0000 mg | ORAL_CAPSULE | Freq: Every day | ORAL | 0 refills | Status: DC

## 2018-02-16 NOTE — Telephone Encounter (Signed)
Please advise Dr Panosh, thanks.   

## 2018-02-16 NOTE — Telephone Encounter (Signed)
Send in 20 xr electronically

## 2018-02-16 NOTE — Telephone Encounter (Signed)
LOV 01/22/18 Dr. Regis Bill [facility] [address]

## 2018-02-21 NOTE — Telephone Encounter (Signed)
Pt aware. Nothing further needed 

## 2018-03-28 ENCOUNTER — Other Ambulatory Visit: Payer: Self-pay | Admitting: Internal Medicine

## 2018-03-28 NOTE — Telephone Encounter (Signed)
Copied from [place] [phone]. Topic: Quick Communication - Rx Refill/Question >> Mar 28, 2018  3:15 PM Synthia Innocent wrote: Medication: amphetamine-dextroamphetamine (ADDERALL XR) 20 MG 24 hr capsule Has the patient contacted their pharmacy? Yes.   (Agent: If no, request that the patient contact the pharmacy for the refill.) Preferred Pharm[facility] (with phone number [facility] [address] Agent: Please be advised that RX refills may take up to 3 business days. We ask that you follow-up with your pharmacy.

## 2018-03-28 NOTE — Telephone Encounter (Signed)
Last OV: 01/22/18 PCP: Panosh [facility]acy: [id number] [place] [address], [place] (Phone) [phone] (Fax)

## 2018-03-30 NOTE — Telephone Encounter (Signed)
Patient calling to inquire about his refill. States he has a few days left of pills, but had not heard anything back since he called on 03/28/18.

## 2018-04-02 MED ORDER — AMPHETAMINE-DEXTROAMPHET ER 20 MG PO CP24: 20.0000 mg | ORAL_CAPSULE | Freq: Every day | ORAL | 0 refills | Status: DC

## 2018-04-02 NOTE — Telephone Encounter (Signed)
Sent in today 

## 2018-04-05 NOTE — Telephone Encounter (Signed)
Pt aware that Rx has been refilled.  Nothing further needed.  

## 2018-04-29 ENCOUNTER — Other Ambulatory Visit: Payer: Self-pay | Admitting: Internal Medicine

## 2018-05-07 ENCOUNTER — Other Ambulatory Visit: Payer: Self-pay | Admitting: Internal Medicine

## 2018-05-07 NOTE — Telephone Encounter (Signed)
Adderall refill Last OV:01/22/18 Last refill:04/02/18 30 cap/0 refill KPT:[id number] [facility]acy: [id number] [address], [place] (Phone) [phone] (Fax)

## 2018-05-07 NOTE — Telephone Encounter (Signed)
Copied from [place] [phone]. Topic: Quick Communication - Rx Refill/Question >> May 07, 2018 10:10 AM Selinda Flavin B, NT wrote: Medication: amphetamine-dextroamphetamine (ADDERALL XR) 20 MG 24 hr capsule  Has the patient contacted their pharmacy? Yes.   (Agent: If no, request that the patient contact the pharmacy for the refill.) (Agent: If yes, when and what did the pharmacy advise?)  Preferred Pharmacy (with phone number or street name): [id number] [place], [place]: Please be advised that RX refills may take up to 3 business days. We ask that you follow-up with your pharmacy.

## 2018-05-08 MED ORDER — AMPHETAMINE-DEXTROAMPHET ER 20 MG PO CP24: 20.0000 mg | ORAL_CAPSULE | Freq: Every day | ORAL | 0 refills | Status: DC

## 2018-05-08 NOTE — Telephone Encounter (Signed)
Please advise Dr Panosh, thanks.   

## 2018-06-04 ENCOUNTER — Other Ambulatory Visit: Payer: Self-pay | Admitting: Internal Medicine

## 2018-06-04 NOTE — Telephone Encounter (Unsigned)
Copied from [place] [phone]. Topic: Quick Communication - Rx Refill/Question >> Jun 04, 2018 10:13 AM Antonieta Iba C wrote: Medication: amphetamine-dextroamphetamine (ADDERALL XR) 20 MG 24 hr capsule  Has the patient contacted their pharmacy? Yes  (Agent: If no, request that the patient contact the pharmacy for the refill.) (Agent: If yes, when and what did the pharmacy advise?)  Preferred Pharmacy (with phone number or street name): [id number] [address], [place] [phone] (Phone) [phone] (Fax)      Agent: Please be advised that RX refills may take up to 3 business days. We ask that you follow-up with your pharmacy.

## 2018-06-04 NOTE — Telephone Encounter (Signed)
Refill of Adderall  LOV 01/22/18  Dr. Regis Bill  LRF 01/10/18  #30  0 refills  Barry Silva

## 2018-06-07 NOTE — Telephone Encounter (Signed)
We had  Decreased to 20 xr last refill    Please contact patient about how he is doing and why we got a request for  higher dose 30 xr  There should be  a 25 mg xr  That we could try also  .  To decide which mg dose to send in

## 2018-06-08 MED ORDER — AMPHETAMINE-DEXTROAMPHET ER 25 MG PO CP24: 25.0000 mg | ORAL_CAPSULE | ORAL | 0 refills | Status: DC

## 2018-06-08 NOTE — Telephone Encounter (Signed)
Sent in electronically .  

## 2018-06-08 NOTE — Telephone Encounter (Signed)
I spoke with pt, he is requesting to try the 25 mg, has been on 20 mg, not sure why the pharmacy sent over a request for 30 mg, patient asked that we update his medication list after sending in new dose[facility]

## 2018-06-08 NOTE — Telephone Encounter (Signed)
I left a voice message for pt to return my call.  

## 2018-06-08 NOTE — Telephone Encounter (Signed)
Pt calling to check status on his refill of adderall and states he is now out of medication.

## 2018-07-05 ENCOUNTER — Other Ambulatory Visit: Payer: Self-pay | Admitting: Internal Medicine

## 2018-07-05 NOTE — Telephone Encounter (Signed)
Copied from [place] [phone]. Topic: General - Other >> Jul 05, 2018 10:59 AM Lennox Solders wrote: Reason for CRM:pt is calling and would like new rx generic adderall 30 mg.

## 2018-07-05 NOTE — Telephone Encounter (Signed)
Please advise 

## 2018-07-06 NOTE — Telephone Encounter (Signed)
Sent to PCP to advise 

## 2018-07-06 NOTE — Telephone Encounter (Signed)
Med list says  25 mg  And request for 30 mg please conact paitne and advise  clarify request

## 2018-07-09 MED ORDER — AMPHETAMINE-DEXTROAMPHET ER 25 MG PO CP24: 25.0000 mg | ORAL_CAPSULE | ORAL | 0 refills | Status: DC

## 2018-07-09 NOTE — Telephone Encounter (Signed)
Patient called in and stated that he made a mistake and he meant to request the 25 mg dose that was prescribed last month. Patient also states that he did not miss a call from our office. Dr. Regis Bill, can you send this electronically? Thanks!

## 2018-07-09 NOTE — Addendum Note (Signed)
Addended byBurnis Medin on: 07/09/2018 06:52 PM   Modules accepted: Orders

## 2018-07-09 NOTE — Telephone Encounter (Signed)
Sent in electronically .  

## 2018-07-09 NOTE — Telephone Encounter (Signed)
I tried to reach pt and no answer, we need to clarify the correct dose. I will forward back to the pharmacy with a note for patient to clarify.

## 2018-07-10 ENCOUNTER — Telehealth: Payer: Self-pay | Admitting: Internal Medicine

## 2018-07-10 MED ORDER — AMPHETAMINE-DEXTROAMPHET ER 25 MG PO CP24: 25.0000 mg | ORAL_CAPSULE | ORAL | 0 refills | Status: DC

## 2018-07-10 NOTE — Addendum Note (Signed)
Addended byBurnis Medin on: 07/10/2018 04:43 PM   Modules accepted: Orders

## 2018-07-10 NOTE — Telephone Encounter (Signed)
Sent in electronically . To different pharmacy

## 2018-07-10 NOTE — Telephone Encounter (Signed)
Barry Silva from [facility] called to have medication refill sent to different pharmacy because they are out of stock.  Pres[facility]  [id number] [address], [place] [phone] (Phone) [phone] (Fax)

## 2018-07-10 NOTE — Telephone Encounter (Signed)
Copied from [place] [id number]. Topic: Quick Communication - Rx Refill/Question >> Jul 10, 2018  2:36 PM Judyann Munson wrote: Medication: amphetamine-dextroamphetamine (ADDERALL XR) 25 MG 24 hr capsule   Has the patient contacted their pharmacy? Yes  Preferred Pharmacy (with phone number or street name):[id number] [address], [place] [phone] (Phone) [phone] (Fax)  [facility]   Agent: Please be advised that RX refills may take up to 3 business days. We ask that you follow-up with your pharmacy.

## 2018-07-10 NOTE — Addendum Note (Signed)
Addended by: Virl Cagey on: 07/10/2018 04:09 PM   Modules accepted: Orders

## 2018-07-10 NOTE — Addendum Note (Signed)
Addended by: Virl Cagey on: 07/10/2018 12:10 PM   Modules accepted: Orders

## 2018-07-10 NOTE — Telephone Encounter (Signed)
Duplicate message. See 07/05/18 refill encounter. Ashtyn made aware and she will have Dr. Regis Bill resend script.

## 2018-07-10 NOTE — Telephone Encounter (Signed)
Pharmacy Rx was sent to yesterday - [facility] is out of the Rx Pt is requesting this be [facility] to [facility] -- already selected in chart and Rx is pending.  Please advise Dr Regis Bill, thanks.

## 2018-07-10 NOTE — Telephone Encounter (Signed)
Pt aware that Rx sent to pharmacy Nothing further needed.

## 2018-07-10 NOTE — Telephone Encounter (Signed)
Please advise Dr Panosh, thanks.   

## 2018-07-10 NOTE — Telephone Encounter (Signed)
I already sent one  Disp 30 in yesterday   Please advice why getting this message again.

## 2018-08-07 ENCOUNTER — Other Ambulatory Visit: Payer: Self-pay | Admitting: Internal Medicine

## 2018-08-07 NOTE — Telephone Encounter (Signed)
Copied from [place] [id number]. Topic: Quick Communication - Rx Refill/Question >> Aug 07, 2018  8:47 AM Reyne Dumas L wrote: Medication: amphetamine-dextroamphetamine (ADDERALL XR) 25 MG 24 hr capsule  Has the patient contacted their pharmacy? No - controlled substance (Agent: If no, request that the patient contact the pharmacy for the refill.) (Agent: If yes, when and what did the pharmacy advise?)  Preferred Pharmacy (with phone number or street name): [id number] [address], [place] [phone] (Phone) [phone] (Fax)  Agent: Please be advised that RX refills may take up to 3 business days. We ask that you follow-up with your pharmacy.

## 2018-08-07 NOTE — Telephone Encounter (Signed)
Adderall refill Last Refill:07/10/18 # 30 Last OV: 01/22/18 PCP: Dr Regis Bill [id number]

## 2018-08-07 NOTE — Telephone Encounter (Signed)
Dr. Regis Bill, please advise on refill. Thanks

## 2018-08-08 MED ORDER — AMPHETAMINE-DEXTROAMPHET ER 25 MG PO CP24: 25.0000 mg | ORAL_CAPSULE | ORAL | 0 refills | Status: DC

## 2018-08-08 NOTE — Telephone Encounter (Signed)
Sent in electronically .  

## 2018-09-04 ENCOUNTER — Encounter: Payer: Self-pay | Admitting: Internal Medicine

## 2018-09-04 ENCOUNTER — Other Ambulatory Visit: Payer: Self-pay | Admitting: Internal Medicine

## 2018-09-04 NOTE — Telephone Encounter (Signed)
Rx refill request: Adderall XR 25 mg      Last filled: 08/08/18  LOV: 01/22/18  PCP: Panosh  Pharmacy: verified

## 2018-09-04 NOTE — Telephone Encounter (Signed)
Copied from [place] [id number]. Topic: General - Other >> Sep 04, 2018  2:35 PM Janace Aris A wrote: Medication: amphetamine-dextroamphetamine (ADDERALL XR) 25 MG 24 hr capsule    Has the patient contacted their pharmacy? Yes  (Agent: If yes, when and what did the pharmacy advise? To contact PCP, no order received.   Preferred Pharmacy (with phone number or street name): [id number] [address], [place]  [phone] (Phone) [phone] (Fax)

## 2018-09-05 MED ORDER — AMPHETAMINE-DEXTROAMPHET ER 25 MG PO CP24: 25.0000 mg | ORAL_CAPSULE | ORAL | 0 refills | Status: DC

## 2018-09-05 NOTE — Telephone Encounter (Signed)
Sent in electronically . Ov duefor refills

## 2018-09-05 NOTE — Telephone Encounter (Signed)
Please advise Dr Panosh, thanks.   

## 2018-10-25 ENCOUNTER — Emergency Department (HOSPITAL_COMMUNITY): Payer: 59

## 2018-10-25 ENCOUNTER — Encounter (HOSPITAL_COMMUNITY): Payer: Self-pay

## 2018-10-25 ENCOUNTER — Other Ambulatory Visit: Payer: Self-pay

## 2018-10-25 ENCOUNTER — Observation Stay (HOSPITAL_COMMUNITY)
Admission: EM | Admit: 2018-10-25 | Discharge: 2018-10-27 | Disposition: A | Discharge status: Home Health | Payer: 59 | Attending: Student | Admitting: Student

## 2018-10-25 DIAGNOSIS — S82872A Displaced pilon fracture of left tibia, initial encounter for closed fracture: Secondary | ICD-10-CM | POA: Diagnosis not present

## 2018-10-25 DIAGNOSIS — Z79899 Other long term (current) drug therapy: Secondary | ICD-10-CM | POA: Insufficient documentation

## 2018-10-25 DIAGNOSIS — S92001A Unspecified fracture of right calcaneus, initial encounter for closed fracture: Secondary | ICD-10-CM

## 2018-10-25 DIAGNOSIS — G4733 Obstructive sleep apnea (adult) (pediatric): Secondary | ICD-10-CM | POA: Insufficient documentation

## 2018-10-25 DIAGNOSIS — S92061A Displaced intraarticular fracture of right calcaneus, initial encounter for closed fracture: Secondary | ICD-10-CM | POA: Insufficient documentation

## 2018-10-25 DIAGNOSIS — Z885 Allergy status to narcotic agent status: Secondary | ICD-10-CM | POA: Insufficient documentation

## 2018-10-25 DIAGNOSIS — W19XXXA Unspecified fall, initial encounter: Secondary | ICD-10-CM

## 2018-10-25 DIAGNOSIS — W1789XA Other fall from one level to another, initial encounter: Secondary | ICD-10-CM | POA: Diagnosis present

## 2018-10-25 DIAGNOSIS — T148XXA Other injury of unspecified body region, initial encounter: Secondary | ICD-10-CM

## 2018-10-25 DIAGNOSIS — Z419 Encounter for procedure for purposes other than remedying health state, unspecified: Secondary | ICD-10-CM

## 2018-10-25 DIAGNOSIS — S82302A Unspecified fracture of lower end of left tibia, initial encounter for closed fracture: Secondary | ICD-10-CM

## 2018-10-25 LAB — COMPREHENSIVE METABOLIC PANEL
ALT: 25 U/L (ref 0–44)
AST: 33 U/L (ref 15–41)
Albumin: 4.2 g/dL (ref 3.5–5.0)
Alkaline Phosphatase: 64 U/L (ref 38–126)
Anion gap: 8 (ref 5–15)
BUN: 18 mg/dL (ref 6–20)
CO2: 27 mmol/L (ref 22–32)
CREATININE: 0.98 mg/dL (ref 0.61–1.24)
Calcium: 9.1 mg/dL (ref 8.9–10.3)
Chloride: 105 mmol/L (ref 98–111)
Glucose, Bld: 92 mg/dL (ref 70–99)
POTASSIUM: 3.8 mmol/L (ref 3.5–5.1)
Sodium: 140 mmol/L (ref 135–145)
Total Bilirubin: 1.6 mg/dL — ABNORMAL HIGH (ref 0.3–1.2)
Total Protein: 6.6 g/dL (ref 6.5–8.1)

## 2018-10-25 LAB — CBC WITH DIFFERENTIAL/PLATELET
Abs Immature Granulocytes: 0.13 10*3/uL — ABNORMAL HIGH (ref 0.00–0.07)
BASOS ABS: 0.1 10*3/uL (ref 0.0–0.1)
Basophils Relative: 1 %
EOS ABS: 0 10*3/uL (ref 0.0–0.5)
EOS PCT: 0 %
HEMATOCRIT: 44.7 % (ref 39.0–52.0)
Hemoglobin: 15.2 g/dL (ref 13.0–17.0)
Immature Granulocytes: 1 %
Lymphocytes Relative: 7 %
Lymphs Abs: 0.9 10*3/uL (ref 0.7–4.0)
MCH: 30.6 pg (ref 26.0–34.0)
MCHC: 34 g/dL (ref 30.0–36.0)
MCV: 90.1 fL (ref 80.0–100.0)
MONO ABS: 1.1 10*3/uL — AB (ref 0.1–1.0)
Monocytes Relative: 8 %
Neutro Abs: 11.2 10*3/uL — ABNORMAL HIGH (ref 1.7–7.7)
Neutrophils Relative %: 83 %
Platelets: 259 10*3/uL (ref 150–400)
RBC: 4.96 MIL/uL (ref 4.22–5.81)
RDW: 11.9 % (ref 11.5–15.5)
WBC: 13.5 10*3/uL — AB (ref 4.0–10.5)
nRBC: 0 % (ref 0.0–0.2)

## 2018-10-25 MED ORDER — HYDROMORPHONE HCL 1 MG/ML IJ SOLN
0.5000 mg | Freq: Once | INTRAMUSCULAR | Status: AC
  Administered 2018-10-25: 0.5 mg via INTRAVENOUS
  Filled 2018-10-25: qty 1

## 2018-10-25 MED ORDER — HYDROMORPHONE HCL 1 MG/ML IJ SOLN
1.0000 mg | Freq: Once | INTRAMUSCULAR | Status: AC
  Administered 2018-10-25: 1 mg via INTRAVENOUS
  Filled 2018-10-25: qty 1

## 2018-10-25 NOTE — ED Notes (Signed)
Attempted to apply ice to right and left legs, but patient insisted that the left leg did not need it. Ice applied to right heel and calf per patient request.

## 2018-10-25 NOTE — ED Notes (Signed)
Patient transported to X-ray 

## 2018-10-25 NOTE — ED Notes (Signed)
Patient orthopedic surgeon is Dr. Marchia Bond at [facility]

## 2018-10-25 NOTE — ED Provider Notes (Signed)
[facility] DEPT Provider Note   CSN: [id number] Arrival date & time: 10/25/18  2157     History   Chief Complaint Chief Complaint  Patient presents with  . Fall  . Leg Pain    HPI Barry Silva is a 57 y.o. male.  Patient with history of HTN, migraine presents with lower extremity injury after fall while indoor rock climbing that occurred approximately one hour ago. He feels he was about 25 feet in the air when he lost his grip and fell to a standing position, then back on buttocks. He did not hit his head or lose consciousness. No neck/chest/abdominal pain, SOB, nausea. He complains of pain to the right heel and left distal lower leg only. No complaint of back pain. He has been unable to weight bear. No wound or bleeding. No numbness.   The history is provided by the patient. No language interpreter was used.  Fall  Pertinent negatives include no chest pain, no headaches and no shortness of breath.  Leg Pain   Pertinent negatives include no numbness.    Past Medical History:  Diagnosis Date  . ADD (attention deficit disorder with hyperactivity)    formal evaluation  . 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 Migraine   . MIGRAINE HEADACHE 08/30/2007   Qualifier: Diagnosis of  By: Scherrie Gerlach    . Nephrolithiasis    hx of episode 1/10 calcium stones  . OSA (obstructive sleep apnea)    mild, Apnealink 7 2011 RDI7, O2 nadir 83%  . Refusal of blood transfusions as patient is Jehovah's Witness   . Sarcoidosis    bronchoscopy 2006, mtx started 04/19/10 try off 11/29/10 (atypical cp/ ? worsening ct chest wlh) off prednisone 7/11    Patient Active Problem List   Diagnosis Date Noted  . Biceps rupture, proximal 10/16/2017  . 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 Abnormal LFTs 12/01/2014  . Left inguinal hernia 11/08/2014  . Encounter for preventive health examination 05/30/2014  . Cough 11/28/2013  . Colon cancer  screening 11/28/2013  . Hypokalemia 05/27/2013  . Tinnitus 05/17/2013  . Allergic contact dermatitis due to [place] 02/25/2013  . Medication management 01/21/2013  . Routine general medical examination at a health care facility 05/22/2012  . Hypertension 03/25/2012  . High risk medication use 03/25/2012  . Stress 06/11/2011  . Sarcoidosis   . OSA (obstructive sleep apnea)   . Nephrolithiasis   . Refusal of blood transfusions as patient is Jehovah's Witness   . OBSTRUCTIVE SLEEP APNEA 05/18/2010  . OTHER SYMPTOMS REFERABLE TO HAND JOINT 07/04/2008  . UNSPECIFIED DISEASE OF NAIL 11/23/2007  . RHINITIS 10/04/2007  . Attention deficit disorder 09/03/2007  . ABNORMAL RESULT, FUNCTION STUDY, EKG 09/03/2007  . ELEVATED BLOOD PRESSURE WITHOUT DIAGNOSIS OF HYPERTENSION 09/03/2007  . NEPHROLITHIASIS, HX OF 09/03/2007  . Sarcoidosis 08/30/2007    Past Surgical History:  Procedure Laterality Date  . BRONCHOSCOPY  2006  . HERNIA REPAIR     age 88  . KIDNEY STONE SURGERY    . [place] Medications    Prior to Admission medications   Medication Sig Start Date End Date Taking? Authorizing Provider  amLODipine (NORVASC) 5 MG tablet TAKE 1 TABLET BY MOUTH EVERY DAY 05/01/18   Panosh, Standley Brooking, MD  amoxicillin (AMOXIL) 500 MG capsule Take 1 capsule (500 mg total) by mouth  3 (three) times daily. For sinustiis 01/22/18   Panosh, Standley Brooking, MD  amphetamine-dextroamphetamine (ADDERALL XR) 25 MG 24 hr capsule Take 1 capsule by mouth every morning. OV needed for  further refills 09/05/18   Panosh, Standley Brooking, MD  losartan (COZAAR) 100 MG tablet TAKE 1 TABLET BY MOUTH EVERY DAY 05/01/18   Panosh, Standley Brooking, MD    Family History Family History  Problem Relation Age of Onset  . Stroke Unknown        Grandfather father older age  . Colon cancer Neg Hx   . Esophageal cancer Neg Hx   . Rectal cancer Neg Hx   . Stomach cancer Neg Hx     Social History Social History    Tobacco Use  . Smoking status: Never Smoker  . Smokeless tobacco: Never Used  Substance Use Topics  . Alcohol use: Yes    Alcohol/week: 8.0 standard drinks    Types: 8 Glasses of wine per week    Comment: socially  . Drug use: No     Allergies   Morphine and related   Review of Systems Review of Systems  Constitutional: Negative for diaphoresis.  Respiratory: Negative.  Negative for shortness of breath.   Cardiovascular: Negative.  Negative for chest pain.  Gastrointestinal: Negative.  Negative for abdominal distention, nausea and vomiting.  Musculoskeletal: Negative for back pain and neck pain.       See HPI  Skin: Negative.  Negative for color change and wound.  Neurological: Negative.  Negative for syncope, weakness, numbness and headaches.     Physical Exam Updated Vital Signs BP (!) 149/100 (BP Location: Right Arm)   Pulse 84   Temp 97.7 F (36.5 C) (Oral)   Resp 15   Ht 5\' 7"  (1.702 m)   Wt 72.6 kg   SpO2 100%   BMI 25.06 kg/m   Physical Exam  Constitutional: He is oriented to person, place, and time. He appears well-developed and well-nourished.  HENT:  Head: Normocephalic.  Neck: Normal range of motion. Neck supple.  Cardiovascular: Normal rate and regular rhythm.  Pulmonary/Chest: Effort normal and breath sounds normal. He exhibits no tenderness.  Abdominal: Soft. Bowel sounds are normal. There is no tenderness. There is no rebound and no guarding.  Musculoskeletal:  Right foot without deformity. There is moderate swelling without discoloration to posterior heel. Left distal lower leg/ankle tenderness. No significant swelling. FROM all digits bilateral feet. No calf tenderness. Knees with no swelling or tenderness. No midline cervical or other spinal tenderness or swelling.   Neurological: He is alert and oriented to person, place, and time. No sensory deficit.  Skin: Skin is warm and dry. No rash noted.  No open wound, bleeding, or significant  ecchymosis.  Psychiatric: He has a normal mood and affect.     ED Treatments / Results  Labs (all labs ordered are listed, but only abnormal results are displayed) Labs Reviewed - No data to display  EKG None  Radiology No results found.  Procedures Procedures (including critical care time)  Medications Ordered in ED Medications - No data to display   Initial Impression / Assessment and Plan / ED Course  I have reviewed the triage vital signs and the nursing notes.  Pertinent labs & imaging results that were available during my care of the patient were reviewed by me and considered in my medical decision making (see chart for details).  Clinical Course as of Oct 26 2307  Thu Oct 25, 2018  2308 DG Foot Complete Right [LM]  2308 DG Ankle Complete Left [LM]    Clinical Course User Index [LM] Tacy Learn, PA-C    Patient to ED after fall from height of about 25 feet while indoor rock climbing, landing in a standing position, falling to a sitting position. He complains of bilateral LE pain.   No concern for additional injuries on exam. Patient is not intoxicated and felt a reliable historian.   Imaging confirms right calcaneal fracture and left distal tibia fracture. Pain and swelling progress to become severe in the ED, better with medications and splinting. VSS.   Discussed with dr. Griffin Basil (on-call for patient's orthopedist Dr. Mardelle Matte) who accepts the patient for admission. Will transfer to [facility] at his request. The patient is aware that Dr. Griffin Basil will be his attending orthopedist and is agreeable.   Patient is stable for transfer to [facility] for further orthopedic treatment.   Final Clinical Impressions(s) / ED Diagnoses   Final diagnoses:  None   1. Right calcaneal fracture 2. Left distal tibia fracture 3. Fall  ED Discharge Orders    None       Charlann Lange, PA-C 10/27/18 1610    Drenda Freeze, MD 10/27/18 [phone]

## 2018-10-25 NOTE — ED Notes (Addendum)
Patient states that the pain in the right heel is a 7/10 and feels like a dull ache and the pain in the left leg is an 8/10 and feels aching/sharp. Patient has a rod in his left leg.

## 2018-10-25 NOTE — ED Triage Notes (Signed)
Pt fell about 25 feet and landed on his feet and fell backwards on his buttocks on a padded surface at a rock climbing facility Pt complains of left lower leg pain and right heel pain No LOC and no head injury EMS gave 170mcgs of fentanyl and doesn't complain of neck pain

## 2018-10-26 ENCOUNTER — Observation Stay (HOSPITAL_COMMUNITY): Payer: 59 | Admitting: Anesthesiology

## 2018-10-26 ENCOUNTER — Emergency Department (HOSPITAL_COMMUNITY): Payer: 59

## 2018-10-26 ENCOUNTER — Encounter (HOSPITAL_COMMUNITY)
Admission: EM | Disposition: A | Discharge status: Home Health | Payer: Self-pay | Source: Home / Self Care | Attending: Emergency Medicine

## 2018-10-26 ENCOUNTER — Observation Stay (HOSPITAL_COMMUNITY): Payer: 59

## 2018-10-26 ENCOUNTER — Encounter (HOSPITAL_COMMUNITY): Payer: Self-pay | Admitting: Anesthesiology

## 2018-10-26 DIAGNOSIS — W1789XA Other fall from one level to another, initial encounter: Secondary | ICD-10-CM | POA: Diagnosis present

## 2018-10-26 HISTORY — PX: EXTERNAL FIXATION LEG: SHX1549

## 2018-10-26 LAB — MRSA PCR SCREENING: MRSA by PCR: NEGATIVE

## 2018-10-26 LAB — NO BLOOD PRODUCTS

## 2018-10-26 SURGERY — EXTERNAL FIXATION, LOWER EXTREMITY
Anesthesia: General | Laterality: Bilateral

## 2018-10-26 MED ORDER — 0.9 % SODIUM CHLORIDE (POUR BTL) OPTIME
TOPICAL | Status: DC | PRN
  Administered 2018-10-26: 1000 mL

## 2018-10-26 MED ORDER — CEFAZOLIN SODIUM-DEXTROSE 2-4 GM/100ML-% IV SOLN
2.0000 g | INTRAVENOUS | Status: AC
  Administered 2018-10-26: 2 g via INTRAVENOUS
  Filled 2018-10-26: qty 100

## 2018-10-26 MED ORDER — METHOCARBAMOL 500 MG PO TABS: 500.0000 mg | ORAL_TABLET | Freq: Four times a day (QID) | ORAL | 0 refills | Status: DC | PRN

## 2018-10-26 MED ORDER — PROMETHAZINE HCL 25 MG/ML IJ SOLN: 6.2500 mg | INTRAMUSCULAR | Status: DC | PRN

## 2018-10-26 MED ORDER — ONDANSETRON HCL 4 MG/2ML IJ SOLN
INTRAMUSCULAR | Status: DC | PRN
  Administered 2018-10-26: 4 mg via INTRAVENOUS

## 2018-10-26 MED ORDER — DOCUSATE SODIUM 100 MG PO CAPS
100.0000 mg | ORAL_CAPSULE | Freq: Two times a day (BID) | ORAL | Status: DC
  Administered 2018-10-26 – 2018-10-27 (×3): 100 mg via ORAL
  Filled 2018-10-26 (×4): qty 1

## 2018-10-26 MED ORDER — HYDROMORPHONE HCL 1 MG/ML IJ SOLN
1.0000 mg | INTRAMUSCULAR | Status: DC | PRN
  Administered 2018-10-26 (×3): 1 mg via INTRAVENOUS
  Filled 2018-10-26 (×4): qty 1

## 2018-10-26 MED ORDER — PROPOFOL 10 MG/ML IV BOLUS
INTRAVENOUS | Status: DC | PRN
  Administered 2018-10-26: 150 mg via INTRAVENOUS

## 2018-10-26 MED ORDER — DEXAMETHASONE SODIUM PHOSPHATE 10 MG/ML IJ SOLN
INTRAMUSCULAR | Status: AC
  Filled 2018-10-26: qty 1

## 2018-10-26 MED ORDER — DEXAMETHASONE SODIUM PHOSPHATE 10 MG/ML IJ SOLN
INTRAMUSCULAR | Status: DC | PRN
  Administered 2018-10-26: 10 mg via INTRAVENOUS

## 2018-10-26 MED ORDER — ENOXAPARIN SODIUM 40 MG/0.4ML ~~LOC~~ SOLN: 40.0000 mg | SUBCUTANEOUS | 0 refills | Status: DC

## 2018-10-26 MED ORDER — LIDOCAINE 2% (20 MG/ML) 5 ML SYRINGE
INTRAMUSCULAR | Status: DC | PRN
  Administered 2018-10-26: 100 mg via INTRAVENOUS

## 2018-10-26 MED ORDER — OXYCODONE HCL 5 MG PO TABS: 5.0000 mg | ORAL_TABLET | ORAL | 0 refills | Status: DC | PRN

## 2018-10-26 MED ORDER — LIDOCAINE 2% (20 MG/ML) 5 ML SYRINGE
INTRAMUSCULAR | Status: AC
  Filled 2018-10-26: qty 5

## 2018-10-26 MED ORDER — PROPOFOL 10 MG/ML IV BOLUS
INTRAVENOUS | Status: AC
  Filled 2018-10-26: qty 20

## 2018-10-26 MED ORDER — HYDROMORPHONE HCL 1 MG/ML IJ SOLN
INTRAMUSCULAR | Status: DC | PRN
  Administered 2018-10-26: 0.5 mg via INTRAVENOUS

## 2018-10-26 MED ORDER — MIDAZOLAM HCL 2 MG/2ML IJ SOLN
INTRAMUSCULAR | Status: AC
  Filled 2018-10-26: qty 2

## 2018-10-26 MED ORDER — AMLODIPINE BESYLATE 5 MG PO TABS
5.0000 mg | ORAL_TABLET | Freq: Every day | ORAL | Status: DC
  Administered 2018-10-26 – 2018-10-27 (×2): 5 mg via ORAL
  Filled 2018-10-26 (×2): qty 1

## 2018-10-26 MED ORDER — CEFAZOLIN SODIUM-DEXTROSE 2-4 GM/100ML-% IV SOLN
INTRAVENOUS | Status: AC
  Filled 2018-10-26: qty 100

## 2018-10-26 MED ORDER — MEPERIDINE HCL 50 MG/ML IJ SOLN: 6.2500 mg | INTRAMUSCULAR | Status: DC | PRN

## 2018-10-26 MED ORDER — DIPHENHYDRAMINE HCL 12.5 MG/5ML PO ELIX: 12.5000 mg | ORAL_SOLUTION | ORAL | Status: DC | PRN

## 2018-10-26 MED ORDER — LACTATED RINGERS IV SOLN
INTRAVENOUS | Status: DC
  Administered 2018-10-26 (×2): via INTRAVENOUS

## 2018-10-26 MED ORDER — ENOXAPARIN SODIUM 40 MG/0.4ML ~~LOC~~ SOLN
40.0000 mg | SUBCUTANEOUS | Status: DC
  Administered 2018-10-27: 40 mg via SUBCUTANEOUS
  Filled 2018-10-26: qty 0.4

## 2018-10-26 MED ORDER — ACETAMINOPHEN 500 MG PO TABS
1000.0000 mg | ORAL_TABLET | Freq: Four times a day (QID) | ORAL | Status: DC
  Administered 2018-10-26 – 2018-10-27 (×5): 1000 mg via ORAL
  Filled 2018-10-26 (×6): qty 2

## 2018-10-26 MED ORDER — KETOROLAC TROMETHAMINE 30 MG/ML IJ SOLN: 30.0000 mg | Freq: Once | INTRAMUSCULAR | Status: DC | PRN

## 2018-10-26 MED ORDER — ROCURONIUM BROMIDE 50 MG/5ML IV SOSY
PREFILLED_SYRINGE | INTRAVENOUS | Status: AC
  Filled 2018-10-26: qty 5

## 2018-10-26 MED ORDER — METHOCARBAMOL 500 MG PO TABS
500.0000 mg | ORAL_TABLET | Freq: Four times a day (QID) | ORAL | Status: DC | PRN
  Administered 2018-10-26 – 2018-10-27 (×4): 500 mg via ORAL
  Filled 2018-10-26 (×5): qty 1

## 2018-10-26 MED ORDER — FENTANYL CITRATE (PF) 250 MCG/5ML IJ SOLN
INTRAMUSCULAR | Status: DC | PRN
  Administered 2018-10-26 (×2): 100 ug via INTRAVENOUS
  Administered 2018-10-26: 50 ug via INTRAVENOUS

## 2018-10-26 MED ORDER — OXYCODONE HCL 5 MG PO TABS
5.0000 mg | ORAL_TABLET | ORAL | Status: DC | PRN
  Administered 2018-10-26 – 2018-10-27 (×6): 10 mg via ORAL
  Filled 2018-10-26 (×6): qty 2

## 2018-10-26 MED ORDER — ONDANSETRON HCL 4 MG/2ML IJ SOLN
INTRAMUSCULAR | Status: AC
  Filled 2018-10-26: qty 2

## 2018-10-26 MED ORDER — SUGAMMADEX SODIUM 200 MG/2ML IV SOLN
INTRAVENOUS | Status: AC
  Filled 2018-10-26: qty 2

## 2018-10-26 MED ORDER — HYDROMORPHONE HCL 1 MG/ML IJ SOLN: 0.2500 mg | INTRAMUSCULAR | Status: DC | PRN

## 2018-10-26 MED ORDER — METHOCARBAMOL 1000 MG/10ML IJ SOLN
500.0000 mg | Freq: Four times a day (QID) | INTRAVENOUS | Status: DC | PRN
  Filled 2018-10-26: qty 5

## 2018-10-26 MED ORDER — LACTATED RINGERS IV SOLN
INTRAVENOUS | Status: DC
  Administered 2018-10-26: 01:00:00 via INTRAVENOUS

## 2018-10-26 MED ORDER — PHENYLEPHRINE 40 MCG/ML (10ML) SYRINGE FOR IV PUSH (FOR BLOOD PRESSURE SUPPORT)
PREFILLED_SYRINGE | INTRAVENOUS | Status: DC | PRN
  Administered 2018-10-26: 80 ug via INTRAVENOUS

## 2018-10-26 MED ORDER — FENTANYL CITRATE (PF) 250 MCG/5ML IJ SOLN
INTRAMUSCULAR | Status: AC
  Filled 2018-10-26: qty 5

## 2018-10-26 MED ORDER — HYDROMORPHONE HCL 1 MG/ML IJ SOLN
INTRAMUSCULAR | Status: AC
  Filled 2018-10-26: qty 0.5

## 2018-10-26 MED ORDER — ROCURONIUM BROMIDE 50 MG/5ML IV SOSY
PREFILLED_SYRINGE | INTRAVENOUS | Status: DC | PRN
  Administered 2018-10-26: 50 mg via INTRAVENOUS

## 2018-10-26 MED ORDER — LOSARTAN POTASSIUM 50 MG PO TABS
100.0000 mg | ORAL_TABLET | Freq: Every day | ORAL | Status: DC
  Administered 2018-10-26 – 2018-10-27 (×2): 100 mg via ORAL
  Filled 2018-10-26 (×2): qty 2

## 2018-10-26 MED ORDER — ONDANSETRON HCL 4 MG/2ML IJ SOLN
4.0000 mg | Freq: Four times a day (QID) | INTRAMUSCULAR | Status: DC | PRN
  Administered 2018-10-26 (×2): 4 mg via INTRAVENOUS
  Filled 2018-10-26 (×2): qty 2

## 2018-10-26 MED ORDER — MORPHINE SULFATE (PF) 2 MG/ML IV SOLN
2.0000 mg | INTRAVENOUS | Status: DC | PRN
  Administered 2018-10-26 (×2): 2 mg via INTRAVENOUS
  Filled 2018-10-26 (×3): qty 1

## 2018-10-26 MED ORDER — MIDAZOLAM HCL 2 MG/2ML IJ SOLN
INTRAMUSCULAR | Status: DC | PRN
  Administered 2018-10-26: 2 mg via INTRAVENOUS

## 2018-10-26 MED ORDER — SUGAMMADEX SODIUM 200 MG/2ML IV SOLN
INTRAVENOUS | Status: DC | PRN
  Administered 2018-10-26: 145 mg via INTRAVENOUS

## 2018-10-26 MED ORDER — ONDANSETRON HCL 4 MG PO TABS: 4.0000 mg | ORAL_TABLET | Freq: Four times a day (QID) | ORAL | Status: DC | PRN

## 2018-10-26 SURGICAL SUPPLY — 52 items
BANDAGE ACE 4X5 VEL STRL LF (GAUZE/BANDAGES/DRESSINGS) ×5 IMPLANT
BANDAGE ACE 6X5 VEL STRL LF (GAUZE/BANDAGES/DRESSINGS) ×3 IMPLANT
BNDG CMPR MED 10X6 ELC LF (GAUZE/BANDAGES/DRESSINGS) ×1
BNDG COHESIVE 4X5 TAN STRL (GAUZE/BANDAGES/DRESSINGS) ×3 IMPLANT
BNDG ELASTIC 6X10 VLCR STRL LF (GAUZE/BANDAGES/DRESSINGS) ×2 IMPLANT
BNDG GAUZE ELAST 4 BULKY (GAUZE/BANDAGES/DRESSINGS) ×6 IMPLANT
BRUSH SCRUB SURG 4.25 DISP (MISCELLANEOUS) ×6 IMPLANT
CHLORAPREP W/TINT 26ML (MISCELLANEOUS) ×5 IMPLANT
CLAMP COMBI 8.0/11.0 LRG/MED (Clamp) ×6 IMPLANT
CLAMP COMBO MED CLIP-ON SELF (Clamp) ×12 IMPLANT
CLAMP LG MULTI PIN (Clamp) ×2 IMPLANT
CLAMP ROD ATTACHMENT (Clamp) ×2 IMPLANT
CLOSURE WOUND 1/2 X4 (GAUZE/BANDAGES/DRESSINGS)
COVER SURGICAL LIGHT HANDLE (MISCELLANEOUS) ×6 IMPLANT
COVER WAND RF STERILE (DRAPES) ×3 IMPLANT
DRAPE C-ARM 42X72 X-RAY (DRAPES) IMPLANT
DRAPE C-ARMOR (DRAPES) ×3 IMPLANT
DRAPE IMP U-DRAPE 54X76 (DRAPES) ×6 IMPLANT
DRAPE ORTHO SPLIT 77X108 STRL (DRAPES) ×6
DRAPE SURG ORHT 6 SPLT 77X108 (DRAPES) ×2 IMPLANT
DRAPE U-SHAPE 47X51 STRL (DRAPES) ×3 IMPLANT
DRSG MEPITEL 4X7.2 (GAUZE/BANDAGES/DRESSINGS) IMPLANT
ELECT REM PT RETURN 9FT ADLT (ELECTROSURGICAL) ×3
ELECTRODE REM PT RTRN 9FT ADLT (ELECTROSURGICAL) ×1 IMPLANT
GAUZE SPONGE 4X4 12PLY STRL (GAUZE/BANDAGES/DRESSINGS) ×3 IMPLANT
GLOVE BIO SURGEON STRL SZ7.5 (GLOVE) ×12 IMPLANT
GLOVE BIOGEL PI IND STRL 7.5 (GLOVE) ×1 IMPLANT
GLOVE BIOGEL PI INDICATOR 7.5 (GLOVE) ×2
GOWN STRL REUS W/ TWL LRG LVL3 (GOWN DISPOSABLE) ×2 IMPLANT
GOWN STRL REUS W/TWL LRG LVL3 (GOWN DISPOSABLE) ×6
KIT BASIN OR (CUSTOM PROCEDURE TRAY) ×3 IMPLANT
KIT TURNOVER KIT B (KITS) ×3 IMPLANT
MANIFOLD NEPTUNE II (INSTRUMENTS) ×3 IMPLANT
NEEDLE 22X1 1/2 (OR ONLY) (NEEDLE) IMPLANT
NS IRRIG 1000ML POUR BTL (IV SOLUTION) ×3 IMPLANT
PACK ORTHO EXTREMITY (CUSTOM PROCEDURE TRAY) ×3 IMPLANT
PAD ARMBOARD 7.5X6 YLW CONV (MISCELLANEOUS) ×6 IMPLANT
PAD CAST 4YDX4 CTTN HI CHSV (CAST SUPPLIES) IMPLANT
PADDING CAST COTTON 4X4 STRL (CAST SUPPLIES) ×3
PADDING CAST COTTON 6X4 STRL (CAST SUPPLIES) ×4 IMPLANT
PIN SHANZ TRANSFIX 6.0X225 (PIN) ×3 IMPLANT
ROD CARBON FIBER 8.0X160MM (Rod) ×4 IMPLANT
ROD CRBN FBR LRG EX-FX 11X400 (Rod) ×4 IMPLANT
SCREW SHANZ 4.0X100 (EXFIX) ×6 IMPLANT
SCREW SHANZ 5.0X175MM (Screw) ×6 IMPLANT
SPONGE LAP 18X18 X RAY DECT (DISPOSABLE) ×3 IMPLANT
STAPLER VISISTAT 35W (STAPLE) ×3 IMPLANT
STRIP CLOSURE SKIN 1/2X4 (GAUZE/BANDAGES/DRESSINGS) IMPLANT
SUT PROLENE 0 CT (SUTURE) IMPLANT
TOWEL OR 17X24 6PK STRL BLUE (TOWEL DISPOSABLE) ×6 IMPLANT
TOWEL OR 17X26 10 PK STRL BLUE (TOWEL DISPOSABLE) ×6 IMPLANT
UNDERPAD 30X30 (UNDERPADS AND DIAPERS) ×3 IMPLANT

## 2018-10-26 NOTE — ED Notes (Signed)
[facility] contacted for transport  

## 2018-10-26 NOTE — Anesthesia Preprocedure Evaluation (Signed)
Anesthesia Evaluation  Patient identified by MRN, date of birth, ID band Patient awake    Reviewed: Allergy & Precautions, NPO status , Patient's Chart, lab work & pertinent test results  Airway Mallampati: I       Dental no notable dental hx. (+) Teeth Intact   Pulmonary sleep apnea ,    Pulmonary exam normal breath sounds clear to auscultation       Cardiovascular hypertension, Pt. on medications Normal cardiovascular exam Rhythm:Regular Rate:Normal     Neuro/Psych    GI/Hepatic negative GI ROS, Neg liver ROS,   Endo/Other    Renal/GU   negative genitourinary   Musculoskeletal   Abdominal Normal abdominal exam  (+)   Peds  Hematology negative hematology ROS (+)   Anesthesia Other Findings   Reproductive/Obstetrics                             Anesthesia Physical Anesthesia Plan  ASA: II  Anesthesia Plan: General   Post-op Pain Management:    Induction: Intravenous  PONV Risk Score and Plan: 3 and Ondansetron, Dexamethasone and Midazolam  Airway Management Planned: Oral ETT  Additional Equipment:   Intra-op Plan:   Post-operative Plan: Extubation in OR  Informed Consent: I have reviewed the patients History and Physical, chart, labs and discussed the procedure including the risks, benefits and alternatives for the proposed anesthesia with the patient or authorized representative who has indicated his/her understanding and acceptance.   Dental advisory given  Plan Discussed with: CRNA  Anesthesia Plan Comments:         Anesthesia Quick Evaluation

## 2018-10-26 NOTE — H&P (Signed)
[facility] H&P  Patient ID: Barry Silva MRN: [id number] DOB/AGE: 55-Nov-1962 55 y.o.  Reason for Consult:Left pilon fracture and right calcaneus Referring Physician: Dr. Ophelia Charter, MD Barry Silva  HPI: Barry Silva is an 57 y.o. male who is being seen in consultation at the request of Dr. Griffin Basil for evaluation of left pilon fracture and right calcaneus fracture.  The patient sustained a fall while at an indoor rockclimbing facility.  He states that he was about 25 feet in the air when he lost his grip and fell to a standing position back on his backside.  He denies any injury to his head.  Complaining of left ankle pain and right heel pain.  Was found to have a left pilon fracture and right calcaneus fracture.  He was transferred to [facility] for evaluation and treatment.  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  He had a significant injury to his left lower extremity with a femur fracture in the 1980s for which she underwent multiple surgeries and procedures.  He has limited range of motion and also leg length discrepancy due to this.  Past Medical History:  Diagnosis Date  . ADD (attention deficit disorder with hyperactivity)    formal evaluation  . 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 Migraine   . MIGRAINE HEADACHE 08/30/2007   Qualifier: Diagnosis of  By: Scherrie Gerlach    . Nephrolithiasis    hx of episode 1/10 calcium stones  . OSA (obstructive sleep apnea)    mild, Apnealink 7 2011 RDI7, O2 nadir 83%  . Refusal of blood transfusions as patient is Jehovah's Witness   . Sarcoidosis    bronchoscopy 2006, mtx started 04/19/10 try off 11/29/10 (atypical cp/ ? worsening ct chest wlh) off prednisone 7/11    Past Surgical History:  Procedure Laterality Date  . BRONCHOSCOPY  2006  . HERNIA REPAIR     age 30  . KIDNEY STONE SURGERY    . KNEE SURGERY  1985 and 1986    Family History  Problem Relation Age of Onset  . Stroke  Unknown        Grandfather father older age  . Colon cancer Neg Hx   . Esophageal cancer Neg Hx   . Rectal cancer Neg Hx   . Stomach cancer Neg Hx     Social History:  reports that he has never smoked. He has never used smokeless tobacco. He reports that he drinks about 8.0 standard drinks of alcohol per week. He reports that he does not use drugs.  Allergies:  Allergies  Allergen Reactions  . Morphine And Related Nausea Only    Medications:  No current facility-administered medications on file prior to encounter.    Current Outpatient Medications on File Prior to Encounter  Medication Sig Dispense Refill  . amLODipine (NORVASC) 5 MG tablet TAKE 1 TABLET BY MOUTH EVERY DAY 30 tablet 3  . amphetamine-dextroamphetamine (ADDERALL XR) 25 MG 24 hr capsule Take 1 capsule by mouth every morning. OV needed for  further refills 30 capsule 0  . losartan (COZAAR) 100 MG tablet TAKE 1 TABLET BY MOUTH EVERY DAY 30 tablet 3  . vitamin C (ASCORBIC ACID) 500 MG tablet Take 500 mg by mouth daily.    [name] amoxicillin (AMOXIL) 500 MG capsule Take 1 capsule (500 mg total) by mouth 3 (three) times daily. For sinustiis (Patient not taking: Reported on 10/25/2018) 21  capsule 0    ROS: Constitutional: No fever or chills Vision: No changes in vision ENT: No difficulty swallowing CV: No chest pain Pulm: No SOB or wheezing GI: No nausea or vomiting GU: No urgency or inability to hold urine Skin: No poor wound healing Neurologic: No numbness or tingling Psychiatric: No depression or anxiety Heme: No bruising Allergic: No reaction to medications or food   Exam: Blood pressure (!) 165/106, pulse 80, temperature 98.4 F (36.9 C), temperature source Oral, resp. rate 16, height 5\' 7"  (1.702 m), weight 72.6 kg, SpO2 92 %. General:NAD Orientation:AAOx3 Mood and Affect: Cooperative and pleasant Gait: Unable to assess due to fractures Coordination and balance: Within normal limits  Left lower extremity:  Reveals significant swelling over the ankle.  No skin wrinkling present.  No skin blistering.  No open wounds that are visible.  Patient has active dorsiflexion plantarflexion of his toes.  Intact sensation the dorsum and plantar aspect of his foot.  Is warm well-perfused foot with 2+ DP pulses.  No obvious deformity about his knee or hip.  He has previous surgical scars from his femur surgery.  Compartments are soft and compressible.  Right lower extremity: Splint is in place it was cut down to evaluate the skin.  He has notable swelling over his lateral aspect of his heel with some ecchymosis.  He has intact motor and sensory function he is able to wiggle his toes and has intact sensation to the dorsum and plantar aspect of his foot.  Is warm well-perfused foot.  No obvious deformity or instability about his knee or his hip.   Medical Decision Making: Imaging: X-rays and CT scan of the left ankle show an intra-articular pilon fracture with anterior subluxation of the talus.  X-rays and CT scan of the right calcaneus/heel show a intra-articulatar Sanders 2/3 with significant shortening and varus.  Labs:  Results for orders placed or performed during the hospital encounter of 10/25/18 (from the past 24 hour(s))  CBC with Differential     Status: Abnormal   Collection Time: 10/25/18 10:44 PM  Result Value Ref Range   WBC 13.5 (H) 4.0 - 10.5 K/uL   RBC 4.96 4.22 - 5.81 MIL/uL   Hemoglobin 15.2 13.0 - 17.0 g/dL   HCT 44.7 39.0 - 52.0 %   MCV 90.1 80.0 - 100.0 fL   MCH 30.6 26.0 - 34.0 pg   MCHC 34.0 30.0 - 36.0 g/dL   RDW 11.9 11.5 - 15.5 %   Platelets 259 150 - 400 K/uL   nRBC 0.0 0.0 - 0.2 %   Neutrophils Relative % 83 %   Neutro Abs 11.2 (H) 1.7 - 7.7 K/uL   Lymphocytes Relative 7 %   Lymphs Abs 0.9 0.7 - 4.0 K/uL   Monocytes Relative 8 %   Monocytes Absolute 1.1 (H) 0.1 - 1.0 K/uL   Eosinophils Relative 0 %   Eosinophils Absolute 0.0 0.0 - 0.5 K/uL   Basophils Relative 1 %    Basophils Absolute 0.1 0.0 - 0.1 K/uL   Immature Granulocytes 1 %   Abs Immature Granulocytes 0.13 (H) 0.00 - 0.07 K/uL  Comprehensive metabolic panel     Status: Abnormal   Collection Time: 10/25/18 10:44 PM  Result Value Ref Range   Sodium 140 135 - 145 mmol/L   Potassium 3.8 3.5 - 5.1 mmol/L   Chloride 105 98 - 111 mmol/L   CO2 27 22 - 32 mmol/L   Glucose, Bld 92 70 -  99 mg/dL   BUN 18 6 - 20 mg/dL   Creatinine, Ser 0.98 0.61 - 1.24 mg/dL   Calcium 9.1 8.9 - 10.3 mg/dL   Total Protein 6.6 6.5 - 8.1 g/dL   Albumin 4.2 3.5 - 5.0 g/dL   AST 33 15 - 41 U/L   ALT 25 0 - 44 U/L   Alkaline Phosphatase 64 38 - 126 U/L   Total Bilirubin 1.6 (H) 0.3 - 1.2 mg/dL   GFR calc non Af Amer >60 >60 mL/min   GFR calc Af Amer >60 >60 mL/min   Anion gap 8 5 - 15  MRSA PCR Screening     Status: None   Collection Time: 10/26/18  6:29 AM  Result Value Ref Range   MRSA by PCR NEGATIVE NEGATIVE    Medical history and chart was reviewed  Assessment/Plan: 57 year old male with a left intra-articular pilon with anterior subluxation as well as a right intra-articular calcaneus fracture.  I recommend proceeding with closed reduction and external fixation of the left pilon fracture.  We will also evaluate the skin for possible open reduction internal fixation of right calcaneus versus delayed fixation after placement in a splint.  Risks and benefits were discussed with the patient and his wife.Risks discussed included bleeding, bleeding causing a hematoma, infection, malunion, nonunion, damage to surrounding nerves and blood vessels, pain, hardware prominence or irritation, hardware failure, stiffness, post-traumatic arthritis, and DVT/PE.  Patient agrees to proceed surgery and consent was obtained.  Shona Needles, MD [facility] [phone] (phone)

## 2018-10-26 NOTE — Progress Notes (Signed)
   10/26/18 1556  PT Visit Information  Last PT Received On 10/26/18    Patient suffers from BLE fractures and postoperative NWB orders bilat which impairs their ability to perform daily activities like walking and transfering in the home.  A walker alone will not resolve the issues with performing activities of daily living. A wheelchair will allow patient to safely perform daily activities.  The patient can self propel in the home or has a caregiver who can provide assistance.   3:56 PM, 10/26/18 Etta Grandchild, PT, DPT Physical Therapist - [place] [phone] (Pager)  [phone] (Office)

## 2018-10-26 NOTE — Progress Notes (Signed)
Patient refusing SCD's due to pain issues

## 2018-10-26 NOTE — ED Notes (Signed)
Patient transported to X-ray 

## 2018-10-26 NOTE — Anesthesia Procedure Notes (Signed)
Procedure Name: Intubation Date/Time: 10/26/2018 8:35 AM Performed by: Renato Shin, CRNA Pre-anesthesia Checklist: Patient identified, Emergency Drugs available, Suction available and Patient being monitored Patient Re-evaluated:Patient Re-evaluated prior to induction Oxygen Delivery Method: Circle system utilized Preoxygenation: Pre-oxygenation with 100% oxygen Induction Type: IV induction Ventilation: Mask ventilation without difficulty Laryngoscope Size: Miller and 3 Grade View: Grade I Tube type: Oral Tube size: 7.5 mm Number of attempts: 1 Airway Equipment and Method: Stylet Placement Confirmation: ETT inserted through vocal cords under direct vision,  positive ETCO2,  CO2 detector and breath sounds checked- equal and bilateral Secured at: 21 cm Tube secured with: Tape Dental Injury: Teeth and Oropharynx as per pre-operative assessment

## 2018-10-26 NOTE — Care Management Note (Signed)
Case Management Note  Patient Details  Name: Barry Silva MRN: [id number] Date of Birth: 06-15-1961  Subjective/Objective:    BLE fractures, NWB                Action/Plan: Spoke to pt and wife, Barry Silva at bedside. [place] for DME. Wife is requesting full electric hospital bed if price is reasonable. Wife made aware that full electric is an Engineer, building services. Pt will receive light weight manual wheelchair, hospital bed, 3n1, overbed table, trapeze, and hoyer lift. Faxed orders to [facility]   Expected Discharge Date:                  Expected Discharge Plan:  [place]  In-House Referral:  NA  Discharge planning Services  CM Consult  Post Acute Care Choice:  Home Health Choice offered to:  Patient  DME Arranged:  3-N-1, Lightweight manual wheelchair with seat cushion, Overbed table, Trapeze, Hospital bed DME Agency:  Other - Comment  HH Arranged:  PT, OT HH Agency:     Status of Service:  In process, will continue to follow  If discussed at Long Length of Stay Meetings, dates discussed:    Additional Comments:  Erenest Rasher, RN 10/26/2018, 5:45 PM

## 2018-10-26 NOTE — Anesthesia Postprocedure Evaluation (Signed)
Anesthesia Post Note  Patient: Barry Silva  Procedure(s) Performed: LEFT LEGEXTERNAL FIXATION .Marland KitchenSPLINT CHANGE RIGHT LEG (Bilateral )     Patient location during evaluation: PACU Anesthesia Type: General Level of consciousness: sedated Pain management: pain level controlled Vital Signs Assessment: post-procedure vital signs reviewed and stable Respiratory status: spontaneous breathing Cardiovascular status: stable Postop Assessment: no apparent nausea or vomiting Anesthetic complications: no    Last Vitals:  Vitals:   10/26/18 1012 10/26/18 1015  BP: (!) 137/91   Pulse:  78  Resp:  16  Temp:    SpO2:  99%    Last Pain:  Vitals:   10/26/18 1015  TempSrc:   PainSc: 0-No pain   Pain Goal:                 Huston Foley

## 2018-10-26 NOTE — ED Notes (Signed)
[facility] at bedside 

## 2018-10-26 NOTE — H&P (View-Only) (Signed)
[facility] (OTS) H&P  Patient ID: Barry Silva MRN: [id number] DOB/AGE: 55-Apr-1962 55 y.o.  Reason for Consult:Left pilon fracture and right calcaneus Referring Physician: Dr. Ophelia Charter, MD Raliegh Ip  HPI: Barry Silva is an 58 y.o. male who is being seen in consultation at the request of Dr. Griffin Basil for evaluation of left pilon fracture and right calcaneus fracture.  The patient sustained a fall while at an indoor rockclimbing facility.  He states that he was about 25 feet in the air when he lost his grip and fell to a standing position back on his backside.  He denies any injury to his head.  Complaining of left ankle pain and right heel pain.  Was found to have a left pilon fracture and right calcaneus fracture.  He was transferred to [facility] for evaluation and treatment.  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  He had a significant injury to his left lower extremity with a femur fracture in the 1980s for which she underwent multiple surgeries and procedures.  He has limited range of motion and also leg length discrepancy due to this.  Past Medical History:  Diagnosis Date  . ADD (attention deficit disorder with hyperactivity)    formal evaluation  . 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 Migraine   . MIGRAINE HEADACHE 08/30/2007   Qualifier: Diagnosis of  By: Scherrie Gerlach    . Nephrolithiasis    hx of episode 1/10 calcium stones  . OSA (obstructive sleep apnea)    mild, Apnealink 7 2011 RDI7, O2 nadir 83%  . Refusal of blood transfusions as patient is Jehovah's Witness   . Sarcoidosis    bronchoscopy 2006, mtx started 04/19/10 try off 11/29/10 (atypical cp/ ? worsening ct chest wlh) off prednisone 7/11    Past Surgical History:  Procedure Laterality Date  . BRONCHOSCOPY  2006  . HERNIA REPAIR     age 52  . KIDNEY STONE SURGERY    . KNEE SURGERY  1985 and 1986    Family History  Problem Relation Age of Onset  . Stroke  Unknown        Grandfather father older age  . Colon cancer Neg Hx   . Esophageal cancer Neg Hx   . Rectal cancer Neg Hx   . Stomach cancer Neg Hx     Social History:  reports that he has never smoked. He has never used smokeless tobacco. He reports that he drinks about 8.0 standard drinks of alcohol per week. He reports that he does not use drugs.  Allergies:  Allergies  Allergen Reactions  . Morphine And Related Nausea Only    Medications:  No current facility-administered medications on file prior to encounter.    Current Outpatient Medications on File Prior to Encounter  Medication Sig Dispense Refill  . amLODipine (NORVASC) 5 MG tablet TAKE 1 TABLET BY MOUTH EVERY DAY 30 tablet 3  . amphetamine-dextroamphetamine (ADDERALL XR) 25 MG 24 hr capsule Take 1 capsule by mouth every morning. OV needed for  further refills 30 capsule 0  . losartan (COZAAR) 100 MG tablet TAKE 1 TABLET BY MOUTH EVERY DAY 30 tablet 3  . vitamin C (ASCORBIC ACID) 500 MG tablet Take 500 mg by mouth daily.    [name] amoxicillin (AMOXIL) 500 MG capsule Take 1 capsule (500 mg total) by mouth 3 (three) times daily. For sinustiis (Patient not taking: Reported on 10/25/2018) 21  capsule 0    ROS: Constitutional: No fever or chills Vision: No changes in vision ENT: No difficulty swallowing CV: No chest pain Pulm: No SOB or wheezing GI: No nausea or vomiting GU: No urgency or inability to hold urine Skin: No poor wound healing Neurologic: No numbness or tingling Psychiatric: No depression or anxiety Heme: No bruising Allergic: No reaction to medications or food   Exam: Blood pressure (!) 165/106, pulse 80, temperature 98.4 F (36.9 C), temperature source Oral, resp. rate 16, height 5\' 7"  (1.702 m), weight 72.6 kg, SpO2 92 %. General:NAD Orientation:AAOx3 Mood and Affect: Cooperative and pleasant Gait: Unable to assess due to fractures Coordination and balance: Within normal limits  Left lower extremity:  Reveals significant swelling over the ankle.  No skin wrinkling present.  No skin blistering.  No open wounds that are visible.  Patient has active dorsiflexion plantarflexion of his toes.  Intact sensation the dorsum and plantar aspect of his foot.  Is warm well-perfused foot with 2+ DP pulses.  No obvious deformity about his knee or hip.  He has previous surgical scars from his femur surgery.  Compartments are soft and compressible.  Right lower extremity: Splint is in place it was cut down to evaluate the skin.  He has notable swelling over his lateral aspect of his heel with some ecchymosis.  He has intact motor and sensory function he is able to wiggle his toes and has intact sensation to the dorsum and plantar aspect of his foot.  Is warm well-perfused foot.  No obvious deformity or instability about his knee or his hip.   Medical Decision Making: Imaging: X-rays and CT scan of the left ankle show an intra-articular pilon fracture with anterior subluxation of the talus.  X-rays and CT scan of the right calcaneus/heel show a intra-articulatar Sanders 2/3 with significant shortening and varus.  Labs:  Results for orders placed or performed during the hospital encounter of 10/25/18 (from the past 24 hour(s))  CBC with Differential     Status: Abnormal   Collection Time: 10/25/18 10:44 PM  Result Value Ref Range   WBC 13.5 (H) 4.0 - 10.5 K/uL   RBC 4.96 4.22 - 5.81 MIL/uL   Hemoglobin 15.2 13.0 - 17.0 g/dL   HCT 44.7 39.0 - 52.0 %   MCV 90.1 80.0 - 100.0 fL   MCH 30.6 26.0 - 34.0 pg   MCHC 34.0 30.0 - 36.0 g/dL   RDW 11.9 11.5 - 15.5 %   Platelets 259 150 - 400 K/uL   nRBC 0.0 0.0 - 0.2 %   Neutrophils Relative % 83 %   Neutro Abs 11.2 (H) 1.7 - 7.7 K/uL   Lymphocytes Relative 7 %   Lymphs Abs 0.9 0.7 - 4.0 K/uL   Monocytes Relative 8 %   Monocytes Absolute 1.1 (H) 0.1 - 1.0 K/uL   Eosinophils Relative 0 %   Eosinophils Absolute 0.0 0.0 - 0.5 K/uL   Basophils Relative 1 %    Basophils Absolute 0.1 0.0 - 0.1 K/uL   Immature Granulocytes 1 %   Abs Immature Granulocytes 0.13 (H) 0.00 - 0.07 K/uL  Comprehensive metabolic panel     Status: Abnormal   Collection Time: 10/25/18 10:44 PM  Result Value Ref Range   Sodium 140 135 - 145 mmol/L   Potassium 3.8 3.5 - 5.1 mmol/L   Chloride 105 98 - 111 mmol/L   CO2 27 22 - 32 mmol/L   Glucose, Bld 92 70 -  99 mg/dL   BUN 18 6 - 20 mg/dL   Creatinine, Ser 0.98 0.61 - 1.24 mg/dL   Calcium 9.1 8.9 - 10.3 mg/dL   Total Protein 6.6 6.5 - 8.1 g/dL   Albumin 4.2 3.5 - 5.0 g/dL   AST 33 15 - 41 U/L   ALT 25 0 - 44 U/L   Alkaline Phosphatase 64 38 - 126 U/L   Total Bilirubin 1.6 (H) 0.3 - 1.2 mg/dL   GFR calc non Af Amer >60 >60 mL/min   GFR calc Af Amer >60 >60 mL/min   Anion gap 8 5 - 15  MRSA PCR Screening     Status: None   Collection Time: 10/26/18  6:29 AM  Result Value Ref Range   MRSA by PCR NEGATIVE NEGATIVE    Medical history and chart was reviewed  Assessment/Plan: 57 year old male with a left intra-articular pilon with anterior subluxation as well as a right intra-articular calcaneus fracture.  I recommend proceeding with closed reduction and external fixation of the left pilon fracture.  We will also evaluate the skin for possible open reduction internal fixation of right calcaneus versus delayed fixation after placement in a splint.  Risks and benefits were discussed with the patient and his wife.Risks discussed included bleeding, bleeding causing a hematoma, infection, malunion, nonunion, damage to surrounding nerves and blood vessels, pain, hardware prominence or irritation, hardware failure, stiffness, post-traumatic arthritis, and DVT/PE.  Patient agrees to proceed surgery and consent was obtained.  Shona Needles, MD [facility] [phone] (phone)

## 2018-10-26 NOTE — Op Note (Signed)
OrthopaedicSurgeryOperativeNote (MEQ:[id number]) Date of Surgery: 10/26/2018  Admit Date: 10/25/2018   Diagnoses: Pre-Op Diagnoses: Left comminuted pilon fracture dislocation Right Sanders 3 calcaneus fracture   Post-Op Diagnosis: Same  Procedures: 1. CPT 20692-Application of external fixation left ankle 2. CPT 27825-Closed reduction left pilon fracture 3. CPT 29515-Application of short leg splint right leg  Surgeons: Primary: Shona Needles, MD   Location:[facility] OR ROOM 03   AnesthesiaGeneral   Antibiotics:Ancef 2g preop   Tourniquettime:* No tourniquets in log * .  [id number]:48 mL   Complications:None  Specimens:None  Implants: Implant Name Type Inv. Item Serial No. Manufacturer Lot No. LRB No. Used Action  MEDIUM CLAMP COMBINATION - [id number] Clamp MEDIUM CLAMP COMBINATION  SYNTHES MAXILLOFACIAL  Left 4 Implanted  CLAMP COMBI 8.0/11.0 LRG/MED - [id number] Clamp CLAMP COMBI 8.0/11.0 LRG/MED  SYNTHES TRAUMA  Left 2 Implanted  ROD CARBON FIBER 8.0X160MM - [id number] Rod ROD CARBON FIBER 8.0X160MM  SYNTHES MAXILLOFACIAL  Left 2 Implanted  CLAMP LG MULTI PIN - [id number] Clamp CLAMP LG MULTI PIN  SYNTHES TRAUMA  Left 1 Implanted  CLAMP ROD ATTACHMENT - [id number] Clamp CLAMP ROD ATTACHMENT  SYNTHES TRAUMA  Left 1 Implanted  ROD CARBON FIBER - [id number] Rod ROD CARBON FIBER  SYNTHES TRAUMA  Left 2 Implanted    IndicationsforSurgery: 57 year old male otherwise healthy fell about 20 to 25 feet off a rockclimbing wall and landed on his bilateral lower extremities.  He had immediate pain and deformity was brought to the emergency room where x-rays showed a left intra-articular pilon fracture with subluxation of the talus.  He also had an intra-articular Sanders 3 calcaneus fracture.  I felt that proceeding to the operating room for closed reduction and external fixation of the left ankle was most appropriate as he was too swollen for definitive fixation.  I also  felt that he was too swollen for definitive fixation for his calcaneus.  As result we recommended proceeding with a short leg splint placement.  Risks and benefits were discussed with the patient including bleeding, infection, posttraumatic arthritis, stiffness, nerve and blood vessel injury, even the possibility of DVT or compartment syndrome.  The patient wished to proceed with surgery and consent was obtained.  Operative Findings: 1.  Placement of spanning external fixation of the left ankle with closed reduction of pilon fracture fracture.  Placement of Synthes large and medium size external fixation 2.  Application of short leg splint to right lower extremity.  Procedure: The patient was identified in the preoperative holding area. Consent was confirmed with the patient and their family and all questions were answered. The operative extremity was marked after confirmation with the patient. They were then brought back to the operating room by our anesthesia colleagues. They were carefully transferred over to a radiolucent flat top table and placed under general anesthesia.The splint was cut down and there was significant swelling and I felt that an incision would be inappropriate at this point and that external fixation would be most appropriate as how unstable his ankle joint was. The operative extremity was then prepped and draped in usual sterile fashion. A preoperative timeout was performed to verify the patient, the procedure, and the extremity. Preoperative antibiotics were dosed.  Using a 6-hole pin clamp as a guide I made two percutaneous incisions and predrilled the tibia and placed 5.32mm threaded half pins, gaining excellent purchase. A lateral fluoroscopic view was obtained to confirm adequate length through the far cortex. A small incision was made over the medial calcaneus a  6.21mm calcaneal transfixation pin was placed through the heel and lateral fluoro was used to confirm placement.  Fluoroscopy was then used to visualize the base of the 1st and 5th metatarsals and a percutaneous incision was made over each. A 2.11mm drill bit was used to predrill both cortices of each bone and a 4.63mm pin was placed. Pin to bar clamps were placed and a medium ex-fix bar was placed connecting the metatarsal pins to the transfixion pin in the calcaneus on both the medial and lateral side. Large bars were used to connect the tibial pin clamp to the medium bars and a reduction maneuver was performed.   The talus was reduced underneath the plafond and all of the clamps were tightened. A lateral view was obtained to show adequate reduction of the talus and the clamps were final tightened. The pin sites were dressed with kerlix and the drapes were broken down.  I then proceeded to place a well-padded short leg splint to the right lower extremity.  The patient was then awoken from anesthesia and taken to the PACU in stable condition.  Post Op Plan/Instructions: The patient will be nonweightbearing bilateral lower extremities.  He will likely discharge home and return to clinic next week for evaluation of his skin to make sure that his swelling is appropriate for surgical incision.  He will be placed on Lovenox for DVT prophylaxis.  No postoperative antibiotics are needed.  I was present and performed the entire surgery.  Katha Hamming, MD [facility]

## 2018-10-26 NOTE — ED Notes (Signed)
ED TO INPATIENT HANDOFF REPORT  Name/Age/Gender Barry Silva 57 y.o. male  Code Status    Code Status Orders  (From admission, onward)         Start     Ordered   10/26/18 0006  Full code  Continuous     10/26/18 0007        Code Status History    This patient has a current code status but no historical code status.    Advance Directive Documentation     Most Recent Value  Type of Advance Directive  Healthcare Power of Attorney  Pre-existing out of facility DNR order (yellow form or pink MOST form)  -  "MOST" Form in Place?  -      Home/SNF/Other Home  Chief Complaint Fall; Leg Pain  Level of Care/Admitting Diagnosis ED Disposition    ED Disposition Condition [place]: [place] [100100]  Level of Care: Med-Surg [16]  Diagnosis: Fall [290176]  Admitting Physician: Hiram Gash [[id number]]  Attending Physician: Hiram Gash [[id number]]  Bed request comments: 5n  PT Class (Do Not Modify): Observation [104]  PT Acc Code (Do Not Modify): Observation [10022]       Medical History Past Medical History:  Diagnosis Date  . ADD (attention deficit disorder with hyperactivity)    formal evaluation  . 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 Migraine   . MIGRAINE HEADACHE 08/30/2007   Qualifier: Diagnosis of  By: Scherrie Gerlach    . Nephrolithiasis    hx of episode 1/10 calcium stones  . OSA (obstructive sleep apnea)    mild, Apnealink 7 2011 RDI7, O2 nadir 83%  . Refusal of blood transfusions as patient is Jehovah's Witness   . Sarcoidosis    bronchoscopy 2006, mtx started 04/19/10 try off 11/29/10 (atypical cp/ ? worsening ct chest wlh) off prednisone 7/11    Allergies Allergies  Allergen Reactions  . Morphine And Related Nausea Only    IV Location/Drains/Wounds Patient Lines/Drains/Airways Status   Active Line/Drains/Airways    Name:   Placement date:   Placement time:   Site:   Days:   Peripheral IV 10/25/18 Left Forearm   10/25/18    2241    Forearm   1          Labs/Imaging Results for orders placed or performed during the hospital encounter of 10/25/18 (from the past 48 hour(s))  CBC with Differential     Status: Abnormal   Collection Time: 10/25/18 10:44 PM  Result Value Ref Range   WBC 13.5 (H) 4.0 - 10.5 K/uL   RBC 4.96 4.22 - 5.81 MIL/uL   Hemoglobin 15.2 13.0 - 17.0 g/dL   HCT 44.7 39.0 - 52.0 %   MCV 90.1 80.0 - 100.0 fL   MCH 30.6 26.0 - 34.0 pg   MCHC 34.0 30.0 - 36.0 g/dL   RDW 11.9 11.5 - 15.5 %   Platelets 259 150 - 400 K/uL   nRBC 0.0 0.0 - 0.2 %   Neutrophils Relative % 83 %   Neutro Abs 11.2 (H) 1.7 - 7.7 K/uL   Lymphocytes Relative 7 %   Lymphs Abs 0.9 0.7 - 4.0 K/uL   Monocytes Relative 8 %   Monocytes Absolute 1.1 (H) 0.1 - 1.0 K/uL   Eosinophils Relative 0 %   Eosinophils Absolute 0.0 0.0 - 0.5 K/uL   Basophils Relative 1 %  Basophils Absolute 0.1 0.0 - 0.1 K/uL   Immature Granulocytes 1 %   Abs Immature Granulocytes 0.13 (H) 0.00 - 0.07 K/uL    Comment: Performed at [facility], [place] [address] [place], [place] [address]  Comprehensive metabolic panel     Status: Abnormal   Collection Time: 10/25/18 10:44 PM  Result Value Ref Range   Sodium 140 135 - 145 mmol/L   Potassium 3.8 3.5 - 5.1 mmol/L   Chloride 105 98 - 111 mmol/L   CO2 27 22 - 32 mmol/L   Glucose, Bld 92 70 - 99 mg/dL   BUN 18 6 - 20 mg/dL   Creatinine, Ser 0.98 0.61 - 1.24 mg/dL   Calcium 9.1 8.9 - 10.3 mg/dL   Total Protein 6.6 6.5 - 8.1 g/dL   Albumin 4.2 3.5 - 5.0 g/dL   AST 33 15 - 41 U/L   ALT 25 0 - 44 U/L   Alkaline Phosphatase 64 38 - 126 U/L   Total Bilirubin 1.6 (H) 0.3 - 1.2 mg/dL   GFR calc non Af Amer >60 >60 mL/min   GFR calc Af Amer >60 >60 mL/min    Comment: (NOTE) The eGFR has been calculated using the CKD EPI equation. This calculation has not been validated in all clinical situations. eGFR's persistently <60 mL/min  signify possible Chronic Kidney Disease.    Anion gap 8 5 - 15    Comment: Performed at [facility], [place] [address] [place], [place] [address]   Dg Ankle Complete Left  Result Date: 10/25/2018 CLINICAL DATA:  Fall EXAM: LEFT ANKLE COMPLETE - 3+ VIEW COMPARISON:  None. FINDINGS: Small plantar calcaneal spur. Acute comminuted fracture involving the distal metaphysis of the tibia with extension to the articular surface. Acute comminuted intra-articular fracture anterior surface of the tibia. Mild anterior subluxation of talar dome with respect to the distal tibia. Widening of the lateral mortise. IMPRESSION: Acute comminuted and displaced intra-articular fracture involving the distal tibia with widening of the lateral mortise. Mild anterior subluxation of the talar dome with respect to the distal tibia. Electronically Signed   By: Donavan Foil M.D.   On: 10/25/2018 23:25   Dg Foot Complete Right  Result Date: 10/25/2018 CLINICAL DATA:  Fall during rock climbing EXAM: RIGHT FOOT COMPLETE - 3+ VIEW COMPARISON:  None. FINDINGS: Acute, severely comminuted diffuse calcaneal fracture with impaction. Extension of fracture lucency to the talocalcaneal articulation. IMPRESSION: Acute severely comminuted diffuse calcaneal fracture Electronically Signed   By: Donavan Foil M.D.   On: 10/25/2018 23:21    Pending Labs Unresulted Labs (From admission, onward)    Start     Ordered   10/26/18 0006  HIV antibody (Routine Testing)  Once,   R     10/26/18 0007          Vitals/Pain Today's Vitals   10/25/18 2310 10/25/18 2324 10/25/18 2330 10/26/18 0010  BP: (!) 143/99  (!) 165/94   Pulse: 83  83   Resp: 12  (!) 24   Temp:      TempSrc:      SpO2: 99%  100%   Weight:      Height:      PainSc:  8   8     Isolation Precautions No active isolations  Medications Medications  lactated ringers infusion (has no administration in time range)  acetaminophen (TYLENOL) tablet 1,000  mg (has no administration in time range)  oxyCODONE (Oxy IR/ROXICODONE) immediate  release tablet 5-10 mg (has no administration in time range)  morphine 2 MG/ML injection 2 mg (has no administration in time range)  methocarbamol (ROBAXIN) tablet 500 mg (has no administration in time range)    Or  methocarbamol (ROBAXIN) 500 mg in dextrose 5 % 50 mL IVPB (has no administration in time range)  diphenhydrAMINE (BENADRYL) 12.5 MG/5ML elixir 12.5-25 mg (has no administration in time range)  docusate sodium (COLACE) capsule 100 mg (has no administration in time range)  ondansetron (ZOFRAN) tablet 4 mg (has no administration in time range)    Or  ondansetron (ZOFRAN) injection 4 mg (has no administration in time range)  HYDROmorphone (DILAUDID) injection 0.5 mg (0.5 mg Intravenous Given 10/25/18 2245)  HYDROmorphone (DILAUDID) injection 1 mg (1 mg Intravenous Given 10/25/18 2330)    Mobility non-ambulatory

## 2018-10-26 NOTE — Transfer of Care (Signed)
Immediate Anesthesia Transfer of Care Note  Patient: Barry Silva  Procedure(s) Performed: EXTERNAL FIXATION LEG (Left )  Patient Location: PACU  Anesthesia Type:General  Level of Consciousness: awake, drowsy and patient cooperative  Airway & Oxygen Therapy: Patient Spontanous Breathing and Patient connected to nasal cannula oxygen  Post-op Assessment: Report given to RN and Post -op Vital signs reviewed and stable  Post vital signs: Reviewed and stable  Last Vitals:  Vitals Value Taken Time  BP 146/94 10/26/2018  9:41 AM  Temp    Pulse 83 10/26/2018  9:42 AM  Resp 9 10/26/2018  9:42 AM  SpO2 100 % 10/26/2018  9:42 AM  Vitals shown include unvalidated device data.  Last Pain:  Vitals:   10/26/18 0207  TempSrc: Oral  PainSc:          Complications: No apparent anesthesia complications

## 2018-10-26 NOTE — Evaluation (Signed)
Physical Therapy Evaluation Patient Details Name: Barry Silva MRN: [id number] DOB: January 09, 1961 Today's Date: 10/26/2018   History of Present Illness  Barry Silva is a 57yo male who comes to [facility] after a ~45foot fall while rock climbing at the gym, sustaining Rt calcaneal fracture, and left tibial fracture. Pt underwent Rt ORIF and Left OREF, expected to undergo further surgical intervention once swelling has decreased. Pt is NWB bilat.   Clinical Impression  Pt admitted with above diagnosis. Pt currently with functional limitations due to the deficits listed below (see "PT Problem List"). Upon entry, pt in bed, wife and friend present. The pt is awake and agreeable to participate, but somnolent, and requesting pain meds prior to mobilization. Pt performs a pivot in bed and posterior transfer into chair with PT and wife assiting with lines/external fixators for safety. Functional mobility assessment demonstrates increased effort/time requirements, poor tolerance, and need for physical assistance, whereas the patient performed these at a higher level of independence PTA. Pt will benefit from skilled PT intervention to increase independence and safety with basic mobility in preparation for discharge to the venue listed below.    Patient suffers from BLE fractures and postoperative NWB orders bilat which impairs their ability to perform daily activities like walking and transfering in the home.  A walker alone will not resolve the issues with performing activities of daily living. A wheelchair will allow patient to safely perform daily activities.  The patient can self propel in the home or has a caregiver who can provide assistance.         Follow Up Recommendations Home health PT;Supervision - Intermittent    Equipment Recommendations  Wheelchair cushion (measurements PT);Hospital bed;Wheelchair (measurements PT)(bilat elevated leg rests )    Recommendations for Other Services OT consult      Precautions / Restrictions Precautions Precautions: None Precaution Comments: external fixators on LLE  Restrictions Weight Bearing Restrictions: Yes RLE Weight Bearing: Non weight bearing LLE Weight Bearing: Non weight bearing      Mobility  Bed Mobility Overal bed mobility: Needs Assistance Bed Mobility: (Semirecumbent 90 degree rotation in bed. )           General bed mobility comments: MinA guarding/lift assist of BLE for safety   Transfers Overall transfer level: Needs assistance   Transfers: Comptroller transfers: Min assist;+2 safety/equipment   General transfer comment: Pt moving well, +2 for line/leads and external fixators.   Ambulation/Gait                Stairs            Wheelchair Mobility    Modified Rankin (Stroke Patients Only)       Balance                                             Pertinent Vitals/Pain Pain Assessment: 0-10 Pain Score: 7  Pain Location: Rt heel; left leg not as bad (5/10)  Pain Intervention(s): Limited activity within patient's tolerance;Premedicated before session;Patient requesting pain meds-RN notified;Monitored during session;RN gave pain meds during session    Page expects to be discharged to:: Private residence Living Arrangements: Spouse/significant other(SIL ) Available Help at Discharge: Family Type of Home: House(2 story townhome) Home Access: Stairs to enter   Technical brewer of Steps: 1 (partial step)  Home Layout:  Two level;1/2 bath on main level(wife planning on chaning first floor into sleeping area) Home Equipment: None      Prior Function Level of Independence: Independent               Hand Dominance   Dominant Hand: Right    Extremity/Trunk Assessment   Upper Extremity Assessment Upper Extremity Assessment: Overall WFL for tasks assessed    Lower Extremity Assessment Lower  Extremity Assessment: Overall WFL for tasks assessed       Communication      Cognition Arousal/Alertness: Suspect due to medications(somnnolent ) Behavior During Therapy: WFL for tasks assessed/performed Overall Cognitive Status: Within Functional Limits for tasks assessed                                        General Comments      Exercises     Assessment/Plan    PT Assessment Patient needs continued PT services  PT Problem List Decreased range of motion;Decreased mobility;Decreased activity tolerance       PT Treatment Interventions DME instruction;Functional mobility training;Patient/family education;Therapeutic activities;Therapeutic exercise;Wheelchair mobility training    PT Goals (Current goals can be found in the Care Plan section)  Acute Rehab PT Goals Patient Stated Goal: manage pain, return to home in [facility]  PT Goal Formulation: With patient Time For Goal Achievement: 11/09/18 Potential to Achieve Goals: Good    Frequency 7X/week   Barriers to discharge Inaccessible home environment 1 step to enter; bed/bath on second floor.     Co-evaluation               AM-PAC PT "6 Clicks" Daily Activity  Outcome Measure Difficulty turning over in bed (including adjusting bedclothes, sheets and blankets)?: A Little Difficulty moving from lying on back to sitting on the side of the bed? : A Little Difficulty sitting down on and standing up from a chair with arms (e.g., wheelchair, bedside commode, etc,.)?: Unable Help needed moving to and from a bed to chair (including a wheelchair)?: Total Help needed walking in hospital room?: Total Help needed climbing 3-5 steps with a railing? : Total 6 Click Score: 10    End of Session   Activity Tolerance: Patient tolerated treatment well;Patient limited by lethargy;Patient limited by pain Patient left: in chair;with call bell/phone within reach;with family/visitor present Nurse Communication: Mobility  status;Patient requests pain meds PT Visit Diagnosis: Difficulty in walking, not elsewhere classified (R26.2)    Time: 8937-3428 PT Time Calculation (min) (ACUTE ONLY): 31 min   Charges:   PT Evaluation $PT Eval Low Complexity: 1 Low PT Treatments $Therapeutic Activity: 8-22 mins       3:52 PM, 10/26/18 Etta Grandchild, PT, DPT Physical Therapist - [place] [phone] (Pager)  [phone] (Office)      Barry Silva 10/26/2018, 3:50 PM

## 2018-10-26 NOTE — Discharge Instructions (Signed)
Orthopaedic Trauma Service Discharge Instructions   General Discharge Instructions  WEIGHT BEARING STATUS: Nonweightbearing to bilateral lower extremities  RANGE OF MOTION/ACTIVITY:Keep both legs elevated as much as possible  Wound Care:See below  DVT/PE prophylaxis:Lovenox 40 mg once daily to prevent blood clots  Diet: as you were eating previously.  Can use over the counter stool softeners and bowel preparations, such as Miralax, to help with bowel movements.  Narcotics can be constipating.  Be sure to drink plenty of fluids  PAIN MEDICATION USE AND EXPECTATIONS  You have likely been given narcotic medications to help control your pain.  After a traumatic event that results in an fracture (broken bone) with or without surgery, it is ok to use narcotic pain medications to help control one's pain.  We understand that everyone responds to pain differently and each individual patient will be evaluated on a regular basis for the continued need for narcotic medications. Ideally, narcotic medication use should last no more than 6-8 weeks (coinciding with fracture healing).   As a patient it is your responsibility as well to monitor narcotic medication use and report the amount and frequency you use these medications when you come to your office visit.   We would also advise that if you are using narcotic medications, you should take a dose prior to therapy to maximize you participation.  IF YOU ARE ON NARCOTIC MEDICATIONS IT IS NOT PERMISSIBLE TO OPERATE A MOTOR VEHICLE (MOTORCYCLE/CAR/TRUCK/MOPED) OR HEAVY MACHINERY DO NOT MIX NARCOTICS WITH OTHER CNS (CENTRAL NERVOUS SYSTEM) DEPRESSANTS SUCH AS ALCOHOL   STOP SMOKING OR USING NICOTINE PRODUCTS!!!!  As discussed nicotine severely impairs your body's ability to heal surgical and traumatic wounds but also impairs bone healing.  Wounds and bone heal by forming microscopic blood vessels (angiogenesis) and nicotine is a vasoconstrictor (essentially,  shrinks blood vessels).  Therefore, if vasoconstriction occurs to these microscopic blood vessels they essentially disappear and are unable to deliver necessary nutrients to the healing tissue.  This is one modifiable factor that you can do to dramatically increase your chances of healing your injury.    (This means no smoking, no nicotine gum, patches, etc)  DO NOT USE NONSTEROIDAL ANTI-INFLAMMATORY DRUGS (NSAID'S)  Using products such as Advil (ibuprofen), Aleve (naproxen), Motrin (ibuprofen) for additional pain control during fracture healing can delay and/or prevent the healing response.  If you would like to take over the counter (OTC) medication, Tylenol (acetaminophen) is ok.  However, some narcotic medications that are given for pain control contain acetaminophen as well. Therefore, you should not exceed more than 4000 mg of tylenol in a day if you do not have liver disease.  Also note that there are may OTC medicines, such as cold medicines and allergy medicines that my contain tylenol as well.  If you have any questions about medications and/or interactions please ask your doctor/PA or your pharmacist.      ICE AND ELEVATE INJURED/OPERATIVE EXTREMITY  Using ice and elevating the injured extremity above your heart can help with swelling and pain control.  Icing in a pulsatile fashion, such as 20 minutes on and 20 minutes off, can be followed.    Do not place ice directly on skin. Make sure there is a barrier between to skin and the ice pack.    Using frozen items such as frozen peas works well as the conform nicely to the are that needs to be iced.  USE AN ACE WRAP OR TED HOSE FOR SWELLING CONTROL  In  addition to icing and elevation, Ace wraps or TED hose are used to help limit and resolve swelling.  It is recommended to use Ace wraps or TED hose until you are informed to stop.    When using Ace Wraps start the wrapping distally (farthest away from the body) and wrap proximally (closer to the  body)   Example: If you had surgery on your leg or thing and you do not have a splint on, start the ace wrap at the toes and work your way up to the thigh        If you had surgery on your upper extremity and do not have a splint on, start the ace wrap at your fingers and work your way up to the upper arm  IF YOU ARE IN A SPLINT OR CAST DO NOT [place]   If your splint gets wet for any reason please contact the office immediately. You may shower in your splint or cast as long as you keep it dry.  This can be done by wrapping in a cast cover or garbage back (or similar)  Do Not stick any thing down your splint or cast such as pencils, money, or hangers to try and scratch yourself with.  If you feel itchy take benadryl as prescribed on the bottle for itching  IF YOU ARE IN A CAM BOOT (BLACK BOOT)  You may remove boot periodically. Perform daily dressing changes as noted below.  Wash the liner of the boot regularly and wear a sock when wearing the boot. It is recommended that you sleep in the boot until told otherwise  CALL THE OFFICE WITH ANY QUESTIONS OR CONCERNS: [phone]    Discharge Pin Site Instructions  Dress pins daily with Kerlix roll starting on POD 2. Wrap the Kerlix so that it tamps the skin down around the pin-skin interface to prevent/limit motion of the skin relative to the pin.  (Pin-skin motion is the primary cause of pain and infection related to external fixator pin sites).  Remove any crust or coagulum that may obstruct drainage with a saline moistened gauze or soap and water.  After POD 3, if there is no discernable drainage on the pin site dressing, the interval for change can by increased to every other day.  You may shower with the fixator, cleaning all pin sites gently with soap and water.  If you have a surgical wound this needs to be completely dry and without drainage before showering.  The extremity can be lifted by the fixator to facilitate wound  care and transfers.  Notify the office/Doctor if you experience increasing drainage, redness, or pain from a pin site, or if you notice purulent (thick, snot-like) drainage.

## 2018-10-27 NOTE — Care Management Note (Signed)
Case Management Note  Patient Details  Name: Barry Silva MRN: [id number] Date of Birth: [birth year]  Subjective/Objective:    BLE fractures, NWB                Action/Plan: Spoke to pt and wife, Barry Silva at bedside. [place] for DME. Wife is requesting full electric hospital bed if price is reasonable. Wife made aware that full electric is an Engineer, building services. Pt will receive light weight manual wheelchair, hospital bed, 3n1, overbed table, trapeze, and hoyer lift. Faxed orders to [facility].   10-27-2018 Spoke to pt and all DME has been delivered to home. Offered choice for [facility]. Pt agreeable to [facility] for HHPT. Contacted [facility] with new referral.     Expected Discharge Date:                  Expected Discharge Plan:  [place]  In-House Referral:  NA  Discharge planning Services  CM Consult  Post Acute Care Choice:  Home Health Choice offered to:  Patient  DME Arranged:  3-N-1, Lightweight manual wheelchair with seat cushion, Overbed table, Trapeze, Hospital bed DME Agency:  Other - Comment  HH Arranged:  PT, OT [place] Agency:  [place]  Status of Service:  Completed, signed off  If discussed at [place] of Stay Meetings, dates discussed:    Additional Comments:  Erenest Rasher, RN 10/27/2018, 12:05 PM

## 2018-10-27 NOTE — Progress Notes (Addendum)
Addendum: I was contacted from the floor by the nursing team.  The patient desires discharge today.  He mobilized well with PT/OT and pain is controlled.  Discharge orders placed.  Post op Rx's made previously by Dr. Doreatha Martin.  Pain medication and DVT prophylaxis Rx were sent electronically to his pharmacy.  Charna Elizabeth Martensen III, PA-C 10/27/2018 4:59 PM   Subjective: Patient reports pain as moderate to severe but improving.  Tolerating diet.  Urinating.  No CP, SOB.  Early mobilization with therapy.  Will need wheelchair due to bilateral LE (NWB) injuries.    Objective:   VITALS:   Vitals:   10/26/18 1208 10/26/18 1345 10/26/18 2055 10/27/18 0522  BP: (!) 151/105 (!) 156/100 (!) 137/91 127/84  Pulse: 84  87 86  Resp: 19  19 20   Temp:   98.6 F (37 C) 98.9 F (37.2 C)  TempSrc:   Oral Oral  SpO2: 94%  98% 94%  Weight:      Height:       CBC Latest Ref Rng & Units 10/25/2018 08/16/2016 08/31/2015  WBC 4.0 - 10.5 K/uL 13.5(H) 5.6 6.5  Hemoglobin 13.0 - 17.0 g/dL 15.2 15.8 16.8  Hematocrit 39.0 - 52.0 % 44.7 45.3 48.7  Platelets 150 - 400 K/uL 259 299.0 322.0   BMP Latest Ref Rng & Units 10/25/2018 10/16/2017 08/16/2016  Glucose 70 - 99 mg/dL 92 164(H) 99  BUN 6 - 20 mg/dL 18 11 16   Creatinine 0.61 - 1.24 mg/dL 0.98 0.87 0.92  Sodium 135 - 145 mmol/L 140 138 138  Potassium 3.5 - 5.1 mmol/L 3.8 3.7 4.2  Chloride 98 - 111 mmol/L 105 100 102  CO2 22 - 32 mmol/L 27 30 31   Calcium 8.9 - 10.3 mg/dL 9.1 9.4 9.0   Intake/Output      11/08 0701 - 11/09 0700 11/09 0701 - 11/10 0700   P.O. 30    I.V. (mL/kg) 934.8 (12.9)    Total Intake(mL/kg) 964.8 (13.3)    Urine (mL/kg/hr) 2070 (1.2)    Blood 50    Total Output 2120    Net -1155.2            Physical Exam: General: NAD.  Upright in bed on arrival.  Calm, pleasant, conversant. Resp: No increased wob Cardio: regular rate and rhythm ABD soft Neurologically intact MSK RLE: Splint intact and in good condition.  Toes  warm.  EHL, FHL intact.  Sensation intact throughout. LLE: External fixator in place and stable.  Pin sites without drainage.  Foot warm with moderate expected swelling.  EHL/FHL intact.  Sensation intact at baseline throughout with some persistent lateral fine sensation loss since remote femur injury.  Assessment: 1 Day Post-Op  S/P Procedure(s) (LRB): LEFT LEGEXTERNAL FIXATION .Marland KitchenSPLINT CHANGE RIGHT LEG (Bilateral) by Dr. Doreatha Martin on 10/26/2018  Active Problems:   Fall   Left ankle closed, comminuted pilon Fracture, status post external fixation Moderate expected swelling.  Sensation at baseline. Stable Nonweightbearing LLE Follow-up Tuesday in the office swelling/skin check  Right closed Sanders 3 calcaneus fracture, status post splinting Stable  Plan: Continue therapy work to assist with mobilization with BLE NWB. Incentive Spirometry Elevate and Apply ice  Weightbearing: NWB BLE Insicional and dressing care: Ex-fix, dressings/splint left intact until follow-up Orthopedic device(s): LLE external fixator Showering: Keep dressing dry VTE prophylaxis: Lovenox, SCDs, ambulation Pain control: Scheduled Tylenol.  Narcotics for breakthrough. Follow - up plan: Tuesday in the office with Dr. Doreatha Martin  Dispo: Remain inpatient  for pain control and mobilization.  Likely discharge tomorrow 10/28/2018.  Please call if pain well controlled, patient desires discharge today, and is confident in his mobilization progress.  Anticipated LOS equal to or greater than 2 midnights due to  -Pain control and expected need for hospital services (PT, OT, Nursing) required for safe discharge.   Prudencio Burly III, PA-C 10/27/2018, 9:49 AM

## 2018-10-27 NOTE — Evaluation (Signed)
Occupational Therapy Evaluation and Discharge Patient Details Name: Barry Silva MRN: [id number] DOB: 04/05/1961 Today's Date: 10/27/2018    History of Present Illness Barry Silva is a 57yo male who comes to [facility] after a ~37foot fall while rock climbing at the gym, sustaining Rt calcaneal fracture, and left tibial fracture. Pt underwent Rt ORIF and Left OREF, expected to undergo further surgical intervention once swelling has decreased. Pt is NWB bilat.    Clinical Impression   PTA, pt was independent with ADL and functional mobility. During session today he was able to demonstrate transfers from bed to w/c, w/c mobility to bathroom, and transfer from w/c to commode all with min guard assist. He requires min assist for LB ADL at this time. Pt will have 24 hour assistance at home from brother and wife and he demonstrates the ability to safely direct his care. Pt educated concerning all precautions and safety modifications. He verbalizes and demonstrates understanding. No further acute OT needs identified. Recommend drop-arm 3-in-1 BSC for home use.     Follow Up Recommendations  No OT follow up;Supervision/Assistance - 24 hour    Equipment Recommendations  3 in 1 bedside commode    Recommendations for Other Services       Precautions / Restrictions Precautions Precautions: None Precaution Comments: external fixators on LLE  Restrictions Weight Bearing Restrictions: Yes RLE Weight Bearing: Non weight bearing LLE Weight Bearing: Non weight bearing      Mobility Bed Mobility Overal bed mobility: Independent                Transfers Overall transfer level: Needs assistance   Transfers: Lateral/Scoot Transfers       Anterior-Posterior transfers: Min guard   General transfer comment: Guarding for safety.     Balance                                           ADL either performed or assessed with clinical judgement   ADL Overall ADL's : Needs  assistance/impaired Eating/Feeding: Set up;Sitting   Grooming: Set up;Sitting   Upper Body Bathing: Set up;Sitting   Lower Body Bathing: Minimal assistance;Sitting/lateral leans   Upper Body Dressing : Set up;Sitting   Lower Body Dressing: Minimal assistance;Sitting/lateral leans   Toilet Transfer: Min guard;BSC Toilet Transfer Details (indicate cue type and reason): lateral scoot from w/c to toilet and back with BSC over toilet Toileting- Clothing Manipulation and Hygiene: Min guard;Sitting/lateral lean       Functional mobility during ADLs: Min guard;Wheelchair General ADL Comments: Pt able to complete transfers from bed to w/c to toilet with min guard assist.      Vision Baseline Vision/History: No visual deficits Patient Visual Report: No change from baseline Vision Assessment?: No apparent visual deficits     Perception     Praxis      Pertinent Vitals/Pain Pain Assessment: Faces Faces Pain Scale: Hurts a little bit Pain Location: No increase in pain with mobility Pain Intervention(s): Limited activity within patient's tolerance;Monitored during session;Repositioned     Hand Dominance Right   Extremity/Trunk Assessment Upper Extremity Assessment Upper Extremity Assessment: Overall WFL for tasks assessed   Lower Extremity Assessment Lower Extremity Assessment: Defer to PT evaluation       Communication     Cognition Arousal/Alertness: Awake/alert Behavior During Therapy: WFL for tasks assessed/performed Overall Cognitive Status: Within Functional Limits for tasks assessed  General Comments  Pt educated concerning safety in the home and modification to remove door from hinges for increased space in doorways    Exercises     Shoulder Instructions      Home Living Family/patient expects to be discharged to:: Private residence Living Arrangements: Spouse/significant other Available Help at Discharge:  Family Type of Home: House(2 story townhome) Home Access: Stairs to enter Technical brewer of Steps: 1 (partial step)    Home Layout: Two level;1/2 bath on main level               Home Equipment: None          Prior Functioning/Environment Level of Independence: Independent                 OT Problem List: Decreased strength;Decreased range of motion;Decreased activity tolerance;Impaired balance (sitting and/or standing);Decreased safety awareness;Decreased knowledge of use of DME or AE;Decreased knowledge of precautions      OT Treatment/Interventions:      OT Goals(Current goals can be found in the care plan section) Acute Rehab OT Goals Patient Stated Goal: manage pain, return to home in [place]  OT Goal Formulation: With patient  OT Frequency:     Barriers to D/C:            Co-evaluation PT/OT/SLP Co-Evaluation/Treatment: Yes Reason for Co-Treatment: Necessary to address cognition/behavior during functional activity;For patient/therapist safety;To address functional/ADL transfers   OT goals addressed during session: ADL's and self-care      AM-PAC PT "6 Clicks" Daily Activity     Outcome Measure Help from another person eating meals?: None Help from another person taking care of personal grooming?: None Help from another person toileting, which includes using toliet, bedpan, or urinal?: A Little Help from another person bathing (including washing, rinsing, drying)?: A Little Help from another person to put on and taking off regular upper body clothing?: None Help from another person to put on and taking off regular lower body clothing?: A Little 6 Click Score: 21   End of Session Equipment Utilized During Treatment: (w/c)  Activity Tolerance: Patient tolerated treatment well Patient left: in bed;with call bell/phone within reach  OT Visit Diagnosis: Other abnormalities of gait and mobility (R26.89);Pain Pain - part of body: Leg                 Time: 0100-7121 OT Time Calculation (min): 23 min Charges:  OT General Charges $OT Visit: 1 Visit OT Evaluation $OT Eval Moderate Complexity: [place] Office [place] A Evalina Tabak 10/27/2018, 3:56 PM

## 2018-10-27 NOTE — Progress Notes (Signed)
Physical Therapy Treatment Patient Details Name: Barry Silva MRN: [id number] DOB: December 21, 1960 Today's Date: 10/27/2018    History of Present Illness Barry Silva is a 57yo male who comes to [facility] after a ~7foot fall while rock climbing at the gym, sustaining Rt calcaneal fracture, and left tibial fracture. Pt underwent Rt ORIF and Left OREF, expected to undergo further surgical intervention once swelling has decreased. Pt is NWB bilat.     PT Comments    Patient's tolerance to treatment today was good.  Patient was in bed upon PT arrival with no family present.  Patient demonstrated good technique with lateral scooting from bed to [facility] and commode with minimal VC for hand placement provided by therapist.  Patient demonstrated good problem solving skills throughout treatment today.  Patient would continue to benefit from acute PT in order to address mobility deficits prior to recommended home health PT based on current functional status.   Follow Up Recommendations  Home health PT;Supervision - Intermittent     Equipment Recommendations  Wheelchair cushion (measurements PT);Hospital bed;Wheelchair (measurements PT)(B elevating leg rests)    Recommendations for Other Services OT consult     Precautions / Restrictions Precautions Precautions: None Precaution Comments: external fixators on LLE  Restrictions Weight Bearing Restrictions: Yes RLE Weight Bearing: Non weight bearing LLE Weight Bearing: Non weight bearing    Mobility  Bed Mobility Overal bed mobility: Independent                Transfers Overall transfer level: Needs assistance   Transfers: Lateral/Scoot Transfers       Anterior-Posterior transfers: Min guard   General transfer comment: Guarding for safety.   Ambulation/Gait                 Stairs             Wheelchair Mobility    Modified Rankin (Stroke Patients Only)       Balance Overall balance assessment: No apparent balance  deficits (not formally assessed)                                          Cognition Arousal/Alertness: Awake/alert Behavior During Therapy: WFL for tasks assessed/performed Overall Cognitive Status: Within Functional Limits for tasks assessed                                        Exercises      General Comments General comments (skin integrity, edema, etc.): Pt educated concerning safety in the home and modification to remove door from hinges for increased space in doorways      Pertinent Vitals/Pain Pain Assessment: Faces Faces Pain Scale: Hurts a little bit Pain Location: No increase in pain with mobility Pain Descriptors / Indicators: Discomfort Pain Intervention(s): Monitored during session;Limited activity within patient's tolerance    Home Living Family/patient expects to be discharged to:: Private residence Living Arrangements: Spouse/significant other Available Help at Discharge: Family Type of Home: House(2 story townhome) Home Access: Stairs to enter   Home Layout: Two level;1/2 bath on main level Home Equipment: None      Prior Function Level of Independence: Independent          PT Goals (current goals can now be found in the care plan section) Acute Rehab PT Goals Patient  Stated Goal: manage pain, return to home in [place]  Potential to Achieve Goals: Good Progress towards PT goals: Progressing toward goals    Frequency    7X/week      PT Plan Current plan remains appropriate    Co-evaluation PT/OT/SLP Co-Evaluation/Treatment: Yes Reason for Co-Treatment: Necessary to address cognition/behavior during functional activity;To address functional/ADL transfers PT goals addressed during session: Mobility/safety with mobility OT goals addressed during session: ADL's and self-care      AM-PAC PT "6 Clicks" Daily Activity  Outcome Measure  Difficulty turning over in bed (including adjusting bedclothes, sheets and  blankets)?: None Difficulty moving from lying on back to sitting on the side of the bed? : None Difficulty sitting down on and standing up from a chair with arms (e.g., wheelchair, bedside commode, etc,.)?: A Little Help needed moving to and from a bed to chair (including a wheelchair)?: A Little Help needed walking in hospital room?: Total Help needed climbing 3-5 steps with a railing? : Total 6 Click Score: 16    End of Session Equipment Utilized During Treatment: Gait belt Activity Tolerance: Patient tolerated treatment well;Patient limited by lethargy;Patient limited by pain Patient left: in chair;with call bell/phone within reach;with family/visitor present Nurse Communication: Mobility status;Patient requests pain meds PT Visit Diagnosis: Difficulty in walking, not elsewhere classified (R26.2)     Time: 1610-9604 PT Time Calculation (min) (ACUTE ONLY): 23 min  Charges:  $Therapeutic Activity: 8-22 mins                     [address], SPTA   Barry Silva 10/27/2018, 4:24 PM

## 2018-10-28 NOTE — Discharge Summary (Signed)
Discharge Summary  Patient ID: Barry Silva MRN: [id number] DOB/AGE: 1961/04/08 57 y.o.  Admit date: 10/25/2018 Discharge date: 10/28/2018  Admission Diagnoses:  Fall  Discharge Diagnoses:  Principal Problem:   Fall   Past Medical History:  Diagnosis Date  . ADD (attention deficit disorder with hyperactivity)    formal evaluation  . 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 Migraine   . MIGRAINE HEADACHE 08/30/2007   Qualifier: Diagnosis of  By: Scherrie Gerlach    . Nephrolithiasis    hx of episode 1/10 calcium stones  . OSA (obstructive sleep apnea)    mild, Apnealink 7 2011 RDI7, O2 nadir 83%  . Refusal of blood transfusions as patient is Jehovah's Witness   . Sarcoidosis    bronchoscopy 2006, mtx started 04/19/10 try off 11/29/10 (atypical cp/ ? worsening ct chest [facility]) off prednisone 7/11    Surgeries: Procedure(s): LEFT LEGEXTERNAL FIXATION .Marland KitchenSPLINT CHANGE RIGHT LEG on 10/26/2018   Consultants (if any): Treatment Team:  Haddix, Thomasene Lot, MD  Discharged Condition: Improved  Hospital Course: Barry Silva is an 57 y.o. male who was admitted 10/25/2018 with a diagnosis of Fall, Left Ankle pilon fracture and Right Calcaneus fracture.  He went to the operating room on 10/26/2018 and underwent the above named procedures.    He was given perioperative antibiotics:  Anti-infectives (From admission, onward)   Start     Dose/Rate Route Frequency Ordered Stop   10/26/18 0745  ceFAZolin (ANCEF) IVPB 2g/100 mL premix     2 g 200 mL/hr over 30 Minutes Intravenous To ShortStay Surgical 10/26/18 0734 10/26/18 0910   10/26/18 0735  ceFAZolin (ANCEF) 2-4 GM/100ML-% IVPB    Note to Pharmacy:  [place] Lewis, Lindsi   : cabinet override      10/26/18 0735 10/26/18 0840    .  He was given sequential compression devices, early ambulation, and Lovenox for DVT prophylaxis.  He remained inpatient for pain control, mobilization, and coordination of necessary equipment to aid in  his safe mobilization.  He benefited maximally from the hospital stay and there were no complications.    Recent vital signs:  Vitals:   10/27/18 0522 10/27/18 1428  BP: 127/84 130/73  Pulse: 86 80  Resp: 20 12  Temp: 98.9 F (37.2 C) 98.7 F (37.1 C)  SpO2: 94% 98%    Recent laboratory studies:  Lab Results  Component Value Date   HGB 15.2 10/25/2018   HGB 15.8 08/16/2016   HGB 16.8 08/31/2015   Lab Results  Component Value Date   WBC 13.5 (H) 10/25/2018   PLT 259 10/25/2018   Lab Results  Component Value Date   INR 1.04 05/07/2010   Lab Results  Component Value Date   NA 140 10/25/2018   K 3.8 10/25/2018   CL 105 10/25/2018   CO2 27 10/25/2018   BUN 18 10/25/2018   CREATININE 0.98 10/25/2018   GLUCOSE 92 10/25/2018    Discharge Medications:   Allergies as of 10/27/2018      Reactions   Morphine And Related Nausea Only      Medication List    STOP taking these medications   amoxicillin 500 MG capsule Commonly known as:  AMOXIL     TAKE these medications   amLODipine 5 MG tablet Commonly known as:  NORVASC TAKE 1 TABLET BY MOUTH EVERY DAY   amphetamine-dextroamphetamine 25 MG 24 hr capsule Commonly known as:  ADDERALL XR Take 1  capsule by mouth every morning. OV needed for  further refills   enoxaparin 40 MG/0.4ML injection Commonly known as:  LOVENOX Inject 0.4 mLs (40 mg total) into the skin daily.   losartan 100 MG tablet Commonly known as:  COZAAR TAKE 1 TABLET BY MOUTH EVERY DAY   methocarbamol 500 MG tablet Commonly known as:  ROBAXIN Take 1 tablet (500 mg total) by mouth every 6 (six) hours as needed for muscle spasms.   oxyCODONE 5 MG immediate release tablet Commonly known as:  Oxy IR/ROXICODONE Take 1 tablet (5 mg total) by mouth every 4 (four) hours as needed for breakthrough pain.   vitamin C 500 MG tablet Commonly known as:  ASCORBIC ACID Take 500 mg by mouth daily.       Diagnostic Studies: Dg Lumbar Spine  Complete  Result Date: 10/26/2018 CLINICAL DATA:  Fall while rock climbing.  Additional encounter. EXAM: LUMBAR SPINE - COMPLETE 4+ VIEW COMPARISON:  CT of the abdomen and pelvis 08/18/2015 FINDINGS: Five non rib-bearing lumbar type vertebral bodies are present. Superior endplate Schmorl's node at L2 is stable. Vertebral body heights are otherwise within normal limits. A moderate facet degenerative changes are present at L4-5 and to a greater extent at L5-S1. Visualized pelvis is within normal limits. IMPRESSION: 1. No acute abnormality. 2. Remote superior endplate Schmorl's node at L2. 3. Facet degenerative changes at L4-5 and L5-S1. Electronically Signed   By: San Morelle M.D.   On: 10/26/2018 00:34   Dg Tibia/fibula Left  Result Date: 10/26/2018 CLINICAL DATA:  Placement of external fixator for the comminuted fracture involving the distal LEFT tibia. EXAM: OPERATIVE LEFT TIBIA AND FIBULA - 2 VIEW COMPARISON:  LEFT ankle x-rays yesterday. CT of the LEFT ankle earlier same day. FINDINGS: Numerous spot images from the C-arm fluoroscopic device were obtained at multiple times during the placement of the external fixation device. The comminuted LEFT tibia fibula fracture is again noted with improved, now anatomic alignment of the ankle joint. The radiologic technologist documented 45 seconds of fluoroscopy time period IMPRESSION: External fixator placement. Anatomic alignment of the ankle joint currently. Electronically Signed   By: Evangeline Dakin M.D.   On: 10/26/2018 09:45   Dg Ankle Complete Left  Result Date: 10/26/2018 CLINICAL DATA:  Post external fixation LEFT lower leg ankle fracture EXAM: LEFT ANKLE COMPLETE - 3+ VIEW COMPARISON:  10/26/2018 intraoperative images FINDINGS: External fixation device has been applied with anchors at the proximal tibia, calcaneus, and metatarsals. Mildly displaced comminuted distal LEFT tibial metaphyseal fracture extending intra-articular at ankle joint,  alignment improved since 10/25/2018. Slight widening of the ankle joint. Fibula appears intact. IMPRESSION: Improved alignment of comminuted displaced distal LEFT tibial metaphyseal fracture with intra-articular extension at ankle joint post external fixation. Electronically Signed   By: Lavonia Dana M.D.   On: 10/26/2018 10:46   Dg Ankle Complete Left  Result Date: 10/25/2018 CLINICAL DATA:  Fall EXAM: LEFT ANKLE COMPLETE - 3+ VIEW COMPARISON:  None. FINDINGS: Small plantar calcaneal spur. Acute comminuted fracture involving the distal metaphysis of the tibia with extension to the articular surface. Acute comminuted intra-articular fracture anterior surface of the tibia. Mild anterior subluxation of talar dome with respect to the distal tibia. Widening of the lateral mortise. IMPRESSION: Acute comminuted and displaced intra-articular fracture involving the distal tibia with widening of the lateral mortise. Mild anterior subluxation of the talar dome with respect to the distal tibia. Electronically Signed   By: Donavan Foil M.D.   On:  10/25/2018 23:25   Ct Ankle Right Wo Contrast  Result Date: 10/26/2018 CLINICAL DATA:  Right heel pain after a fall. EXAM: CT OF THE RIGHT ANKLE WITHOUT CONTRAST TECHNIQUE: Multidetector CT imaging of the right ankle was performed according to the standard protocol. Multiplanar CT image reconstructions were also generated. COMPARISON:  None. FINDINGS: Bones/Joint/Cartilage Severely comminuted and depressed fractures of the calcaneus involving the anterior, body, and posterior calcaneus. Fracture lines extend to the anterior, middle, and posterior facets with significant cortical depression and rotation of the articular surface of the posterior facet. Fracture lines extend anteriorly to the calcaneal cuboidal and calcaneal navicular articulations. Talus and distal tarsal bones appear intact. Ligaments Suboptimally assessed by CT. Muscles and Tendons Limited visualization but  appear intact. Soft tissues Soft tissue edema about the hindfoot. IMPRESSION: Severely comminuted and depressed fractures of the calcaneus as described. Electronically Signed   By: Lucienne Capers M.D.   On: 10/26/2018 01:27   Ct Ankle Left Wo Contrast  Result Date: 10/26/2018 CLINICAL DATA:  Fall from 25 feet at rock climbing facility. Ankle fracture. EXAM: CT OF THE LEFT ANKLE WITHOUT CONTRAST TECHNIQUE: Multidetector CT imaging of the left ankle was performed according to the standard protocol. Multiplanar CT image reconstructions were also generated. COMPARISON:  Left ankle radiographs of the same day. FINDINGS: Bones/Joint/Cartilage Comminuted distal tibia fracture is again seen. A posterior fragment is displaced 11 mm. The talus is displaced anteriorly relative to the tibia. A lateral scratched at the lateral aspect of the distal tibia is displaced 11 mm. Comminuted fragments are present. And anteromedial fragment is inverted. The talus is intact.  Calcaneus is also intact. Ligaments Suboptimally assessed by CT. Muscles and Tendons Edematous changes are present. Soft tissues Subcutaneous edema is noted. IMPRESSION: 1. Comminuted fracture of the distal tibia with fragments displaced posterior and medial. 2. Talar dome intact. 3. Anterior displacement of the talus relative to the distal fibula, suggesting ligamentous disruption. 4. Extensive soft tissue swelling, lateral greater than medial. 5. Calcaneus intact. Electronically Signed   By: San Morelle M.D.   On: 10/26/2018 01:30   Dg Foot Complete Right  Result Date: 10/25/2018 CLINICAL DATA:  Fall during rock climbing EXAM: RIGHT FOOT COMPLETE - 3+ VIEW COMPARISON:  None. FINDINGS: Acute, severely comminuted diffuse calcaneal fracture with impaction. Extension of fracture lucency to the talocalcaneal articulation. IMPRESSION: Acute severely comminuted diffuse calcaneal fracture Electronically Signed   By: Donavan Foil M.D.   On: 10/25/2018  23:21   Dg C-arm 1-60 Min  Result Date: 10/26/2018 CLINICAL DATA:  Placement of external fixator for the comminuted fracture involving the distal LEFT tibia. EXAM: OPERATIVE LEFT TIBIA AND FIBULA - 2 VIEW COMPARISON:  LEFT ankle x-rays yesterday. CT of the LEFT ankle earlier same day. FINDINGS: Numerous spot images from the C-arm fluoroscopic device were obtained at multiple times during the placement of the external fixation device. The comminuted LEFT tibia fibula fracture is again noted with improved, now anatomic alignment of the ankle joint. The radiologic technologist documented 45 seconds of fluoroscopy time period IMPRESSION: External fixator placement. Anatomic alignment of the ankle joint currently. Electronically Signed   By: Evangeline Dakin M.D.   On: 10/26/2018 09:45    Disposition: Discharge disposition: 01-Home or Self Care       Discharge Instructions    Discharge patient   Complete by:  As directed    Discharge disposition:  01-Home or Self Care   Discharge patient date:  10/27/2018  Follow-up Information    Haddix, Thomasene Lot, MD. Schedule an appointment as soon as possible for a visit on 10/31/2018.   Specialty:  Orthopedic Surgery Contact information: [place] [address] [facility] Follow up.   Specialty:  Cecilia Why:  [place] will call to arrange visit Contact information: [address] [place] [address] [phone]            Signed: Prudencio Burly III PA-C 10/28/2018, 8:07 AM

## 2018-10-29 ENCOUNTER — Encounter: Payer: Self-pay | Admitting: Student

## 2018-10-29 ENCOUNTER — Encounter (HOSPITAL_COMMUNITY): Payer: Self-pay | Admitting: Student

## 2018-10-29 DIAGNOSIS — S82872A Displaced pilon fracture of left tibia, initial encounter for closed fracture: Secondary | ICD-10-CM | POA: Insufficient documentation

## 2018-10-29 DIAGNOSIS — S92061A Displaced intraarticular fracture of right calcaneus, initial encounter for closed fracture: Secondary | ICD-10-CM | POA: Insufficient documentation

## 2018-10-30 ENCOUNTER — Ambulatory Visit: Payer: Self-pay | Admitting: Student

## 2018-10-30 DIAGNOSIS — S92001A Unspecified fracture of right calcaneus, initial encounter for closed fracture: Secondary | ICD-10-CM

## 2018-10-30 DIAGNOSIS — S82302A Unspecified fracture of lower end of left tibia, initial encounter for closed fracture: Secondary | ICD-10-CM | POA: Insufficient documentation

## 2018-10-31 ENCOUNTER — Other Ambulatory Visit: Payer: Self-pay | Admitting: Internal Medicine

## 2018-11-01 ENCOUNTER — Other Ambulatory Visit: Payer: Self-pay

## 2018-11-01 ENCOUNTER — Encounter (HOSPITAL_COMMUNITY): Payer: Self-pay | Admitting: *Deleted

## 2018-11-02 ENCOUNTER — Ambulatory Visit (HOSPITAL_COMMUNITY): Payer: 59 | Admitting: Anesthesiology

## 2018-11-02 ENCOUNTER — Ambulatory Visit (HOSPITAL_COMMUNITY): Payer: 59

## 2018-11-02 ENCOUNTER — Encounter (HOSPITAL_COMMUNITY): Payer: Self-pay | Admitting: Surgery

## 2018-11-02 ENCOUNTER — Observation Stay (HOSPITAL_COMMUNITY): Payer: 59

## 2018-11-02 ENCOUNTER — Encounter (HOSPITAL_COMMUNITY)
Admission: RE | Disposition: A | Discharge status: Home / Self Care | Payer: Self-pay | Source: Ambulatory Visit | Attending: Student

## 2018-11-02 ENCOUNTER — Observation Stay (HOSPITAL_COMMUNITY)
Admission: RE | Admit: 2018-11-02 | Discharge: 2018-11-05 | Disposition: A | Discharge status: Home / Self Care | Payer: 59 | Source: Ambulatory Visit | Attending: Student | Admitting: Student

## 2018-11-02 DIAGNOSIS — Z419 Encounter for procedure for purposes other than remedying health state, unspecified: Secondary | ICD-10-CM

## 2018-11-02 DIAGNOSIS — S92001A Unspecified fracture of right calcaneus, initial encounter for closed fracture: Principal | ICD-10-CM | POA: Insufficient documentation

## 2018-11-02 DIAGNOSIS — Z531 Procedure and treatment not carried out because of patient's decision for reasons of belief and group pressure: Secondary | ICD-10-CM | POA: Insufficient documentation

## 2018-11-02 DIAGNOSIS — Z79899 Other long term (current) drug therapy: Secondary | ICD-10-CM | POA: Insufficient documentation

## 2018-11-02 DIAGNOSIS — Y998 Other external cause status: Secondary | ICD-10-CM | POA: Diagnosis not present

## 2018-11-02 DIAGNOSIS — W1789XA Other fall from one level to another, initial encounter: Secondary | ICD-10-CM | POA: Diagnosis not present

## 2018-11-02 DIAGNOSIS — D869 Sarcoidosis, unspecified: Secondary | ICD-10-CM | POA: Diagnosis not present

## 2018-11-02 DIAGNOSIS — G4733 Obstructive sleep apnea (adult) (pediatric): Secondary | ICD-10-CM | POA: Diagnosis not present

## 2018-11-02 DIAGNOSIS — I10 Essential (primary) hypertension: Secondary | ICD-10-CM | POA: Insufficient documentation

## 2018-11-02 DIAGNOSIS — S82302A Unspecified fracture of lower end of left tibia, initial encounter for closed fracture: Secondary | ICD-10-CM

## 2018-11-02 DIAGNOSIS — S82872A Displaced pilon fracture of left tibia, initial encounter for closed fracture: Secondary | ICD-10-CM | POA: Diagnosis present

## 2018-11-02 HISTORY — DX: Adverse effect of unspecified anesthetic, initial encounter: T41.45XA

## 2018-11-02 HISTORY — DX: Essential (primary) hypertension: I10

## 2018-11-02 HISTORY — DX: Other complications of anesthesia, initial encounter: T88.59XA

## 2018-11-02 HISTORY — DX: Personal history of urinary calculi: Z87.442

## 2018-11-02 HISTORY — PX: ORIF CALCANEOUS FRACTURE: SHX5030

## 2018-11-02 HISTORY — PX: EXTERNAL FIXATION REMOVAL: SHX5040

## 2018-11-02 HISTORY — PX: OPEN REDUCTION INTERNAL FIXATION (ORIF) TIBIA/FIBULA FRACTURE: SHX5992

## 2018-11-02 LAB — PROTIME-INR
INR: 0.96
Prothrombin Time: 12.7 seconds (ref 11.4–15.2)

## 2018-11-02 SURGERY — OPEN REDUCTION INTERNAL FIXATION (ORIF) TIBIA/FIBULA FRACTURE
Anesthesia: General | Site: Leg Lower | Laterality: Right

## 2018-11-02 MED ORDER — ROCURONIUM BROMIDE 50 MG/5ML IV SOSY
PREFILLED_SYRINGE | INTRAVENOUS | Status: DC | PRN
  Administered 2018-11-02: 20 mg via INTRAVENOUS
  Administered 2018-11-02: 30 mg via INTRAVENOUS
  Administered 2018-11-02: 50 mg via INTRAVENOUS

## 2018-11-02 MED ORDER — FENTANYL CITRATE (PF) 250 MCG/5ML IJ SOLN
INTRAMUSCULAR | Status: AC
  Filled 2018-11-02: qty 5

## 2018-11-02 MED ORDER — CEFAZOLIN SODIUM 1 G IJ SOLR
INTRAMUSCULAR | Status: AC
  Filled 2018-11-02: qty 20

## 2018-11-02 MED ORDER — PROPOFOL 10 MG/ML IV BOLUS
INTRAVENOUS | Status: DC | PRN
  Administered 2018-11-02: 150 mg via INTRAVENOUS

## 2018-11-02 MED ORDER — BACITRACIN ZINC 500 UNIT/GM EX OINT
TOPICAL_OINTMENT | CUTANEOUS | Status: AC
  Filled 2018-11-02: qty 28.35

## 2018-11-02 MED ORDER — KETOROLAC TROMETHAMINE 15 MG/ML IJ SOLN
15.0000 mg | Freq: Four times a day (QID) | INTRAMUSCULAR | Status: AC
  Administered 2018-11-02 – 2018-11-03 (×5): 15 mg via INTRAVENOUS
  Filled 2018-11-02 (×4): qty 1

## 2018-11-02 MED ORDER — LACTATED RINGERS IV SOLN
INTRAVENOUS | Status: DC | PRN
  Administered 2018-11-02 (×5): via INTRAVENOUS

## 2018-11-02 MED ORDER — ROCURONIUM BROMIDE 50 MG/5ML IV SOSY
PREFILLED_SYRINGE | INTRAVENOUS | Status: AC
  Filled 2018-11-02: qty 5

## 2018-11-02 MED ORDER — SUGAMMADEX SODIUM 200 MG/2ML IV SOLN
INTRAVENOUS | Status: AC
  Filled 2018-11-02: qty 2

## 2018-11-02 MED ORDER — LOSARTAN POTASSIUM 50 MG PO TABS
100.0000 mg | ORAL_TABLET | Freq: Every day | ORAL | Status: DC
  Administered 2018-11-02 – 2018-11-05 (×4): 100 mg via ORAL
  Filled 2018-11-02 (×4): qty 2

## 2018-11-02 MED ORDER — ACETAMINOPHEN 10 MG/ML IV SOLN
INTRAVENOUS | Status: DC | PRN
  Administered 2018-11-02: 1000 mg via INTRAVENOUS

## 2018-11-02 MED ORDER — ACETAMINOPHEN 10 MG/ML IV SOLN
INTRAVENOUS | Status: AC
  Filled 2018-11-02: qty 100

## 2018-11-02 MED ORDER — AMLODIPINE BESYLATE 5 MG PO TABS
5.0000 mg | ORAL_TABLET | Freq: Every day | ORAL | Status: DC
  Administered 2018-11-02 – 2018-11-05 (×4): 5 mg via ORAL
  Filled 2018-11-02 (×4): qty 1

## 2018-11-02 MED ORDER — HYDROMORPHONE HCL 1 MG/ML IJ SOLN
INTRAMUSCULAR | Status: AC
  Filled 2018-11-02: qty 1

## 2018-11-02 MED ORDER — HYDROMORPHONE HCL 1 MG/ML IJ SOLN
INTRAMUSCULAR | Status: AC
  Filled 2018-11-02: qty 0.5

## 2018-11-02 MED ORDER — VANCOMYCIN HCL 500 MG IV SOLR
INTRAVENOUS | Status: DC | PRN
  Administered 2018-11-02: 1000 mg via TOPICAL

## 2018-11-02 MED ORDER — DEXAMETHASONE SODIUM PHOSPHATE 10 MG/ML IJ SOLN
INTRAMUSCULAR | Status: DC | PRN
  Administered 2018-11-02: 10 mg via INTRAVENOUS

## 2018-11-02 MED ORDER — VITAMIN C 500 MG PO TABS: 500.0000 mg | ORAL_TABLET | Freq: Every day | ORAL | Status: DC

## 2018-11-02 MED ORDER — ONDANSETRON HCL 4 MG/2ML IJ SOLN: 4.0000 mg | Freq: Four times a day (QID) | INTRAMUSCULAR | Status: DC | PRN

## 2018-11-02 MED ORDER — CHLORHEXIDINE GLUCONATE 4 % EX LIQD: 60.0000 mL | Freq: Once | CUTANEOUS | Status: DC

## 2018-11-02 MED ORDER — GABAPENTIN 100 MG PO CAPS
100.0000 mg | ORAL_CAPSULE | Freq: Three times a day (TID) | ORAL | Status: DC
  Administered 2018-11-02 – 2018-11-03 (×2): 100 mg via ORAL
  Filled 2018-11-02 (×2): qty 1

## 2018-11-02 MED ORDER — FENTANYL CITRATE (PF) 250 MCG/5ML IJ SOLN
INTRAMUSCULAR | Status: DC | PRN
  Administered 2018-11-02 (×3): 50 ug via INTRAVENOUS
  Administered 2018-11-02: 100 ug via INTRAVENOUS
  Administered 2018-11-02 (×6): 50 ug via INTRAVENOUS

## 2018-11-02 MED ORDER — MIDAZOLAM HCL 5 MG/5ML IJ SOLN
INTRAMUSCULAR | Status: DC | PRN
  Administered 2018-11-02: 2 mg via INTRAVENOUS

## 2018-11-02 MED ORDER — LIDOCAINE 2% (20 MG/ML) 5 ML SYRINGE
INTRAMUSCULAR | Status: AC
  Filled 2018-11-02: qty 5

## 2018-11-02 MED ORDER — ONDANSETRON HCL 4 MG/2ML IJ SOLN
INTRAMUSCULAR | Status: AC
  Filled 2018-11-02: qty 2

## 2018-11-02 MED ORDER — POVIDONE-IODINE 10 % EX SWAB: 2.0000 "application " | Freq: Once | CUTANEOUS | Status: DC

## 2018-11-02 MED ORDER — HYDROMORPHONE HCL 1 MG/ML IJ SOLN
INTRAMUSCULAR | Status: DC | PRN
  Administered 2018-11-02: 0.5 mg via INTRAVENOUS

## 2018-11-02 MED ORDER — VANCOMYCIN HCL 1000 MG IV SOLR
INTRAVENOUS | Status: DC | PRN
  Administered 2018-11-02: 1000 mg via TOPICAL

## 2018-11-02 MED ORDER — LABETALOL HCL 5 MG/ML IV SOLN
INTRAVENOUS | Status: DC | PRN
  Administered 2018-11-02 (×2): 5 mg via INTRAVENOUS

## 2018-11-02 MED ORDER — ACETAMINOPHEN 500 MG PO TABS
500.0000 mg | ORAL_TABLET | Freq: Two times a day (BID) | ORAL | Status: DC
  Administered 2018-11-02 – 2018-11-03 (×2): 500 mg via ORAL
  Filled 2018-11-02 (×2): qty 1

## 2018-11-02 MED ORDER — CEFAZOLIN SODIUM-DEXTROSE 2-4 GM/100ML-% IV SOLN
2.0000 g | INTRAVENOUS | Status: AC
  Administered 2018-11-02 (×2): 2 g via INTRAVENOUS

## 2018-11-02 MED ORDER — PHENYLEPHRINE 40 MCG/ML (10ML) SYRINGE FOR IV PUSH (FOR BLOOD PRESSURE SUPPORT)
PREFILLED_SYRINGE | INTRAVENOUS | Status: DC | PRN
  Administered 2018-11-02: 40 ug via INTRAVENOUS
  Administered 2018-11-02 (×4): 80 ug via INTRAVENOUS
  Administered 2018-11-02: 40 ug via INTRAVENOUS
  Administered 2018-11-02: 80 ug via INTRAVENOUS
  Administered 2018-11-02: 120 ug via INTRAVENOUS
  Administered 2018-11-02: 80 ug via INTRAVENOUS
  Administered 2018-11-02: 40 ug via INTRAVENOUS
  Administered 2018-11-02: 80 ug via INTRAVENOUS

## 2018-11-02 MED ORDER — KETOROLAC TROMETHAMINE 15 MG/ML IJ SOLN
INTRAMUSCULAR | Status: AC
  Filled 2018-11-02: qty 1

## 2018-11-02 MED ORDER — PROMETHAZINE HCL 25 MG/ML IJ SOLN: 6.2500 mg | INTRAMUSCULAR | Status: DC | PRN

## 2018-11-02 MED ORDER — VECURONIUM BROMIDE 10 MG IV SOLR
INTRAVENOUS | Status: DC | PRN
  Administered 2018-11-02: 2 mg via INTRAVENOUS
  Administered 2018-11-02: 1 mg via INTRAVENOUS
  Administered 2018-11-02 (×2): 2 mg via INTRAVENOUS
  Administered 2018-11-02: 3 mg via INTRAVENOUS

## 2018-11-02 MED ORDER — ENOXAPARIN SODIUM 40 MG/0.4ML ~~LOC~~ SOLN
40.0000 mg | SUBCUTANEOUS | Status: DC
  Administered 2018-11-03 – 2018-11-05 (×3): 40 mg via SUBCUTANEOUS
  Filled 2018-11-02 (×3): qty 0.4

## 2018-11-02 MED ORDER — DEXAMETHASONE SODIUM PHOSPHATE 10 MG/ML IJ SOLN
INTRAMUSCULAR | Status: AC
  Filled 2018-11-02: qty 1

## 2018-11-02 MED ORDER — LABETALOL HCL 5 MG/ML IV SOLN
INTRAVENOUS | Status: AC
  Filled 2018-11-02: qty 4

## 2018-11-02 MED ORDER — PHENYLEPHRINE 40 MCG/ML (10ML) SYRINGE FOR IV PUSH (FOR BLOOD PRESSURE SUPPORT)
PREFILLED_SYRINGE | INTRAVENOUS | Status: AC
  Filled 2018-11-02: qty 10

## 2018-11-02 MED ORDER — 0.9 % SODIUM CHLORIDE (POUR BTL) OPTIME
TOPICAL | Status: DC | PRN
  Administered 2018-11-02: 1000 mL

## 2018-11-02 MED ORDER — VANCOMYCIN HCL 1000 MG IV SOLR
INTRAVENOUS | Status: AC
  Filled 2018-11-02: qty 2000

## 2018-11-02 MED ORDER — DEXMEDETOMIDINE HCL IN NACL 200 MCG/50ML IV SOLN
INTRAVENOUS | Status: DC | PRN
  Administered 2018-11-02 (×3): 8 ug via INTRAVENOUS

## 2018-11-02 MED ORDER — HYDROMORPHONE HCL 1 MG/ML IJ SOLN
0.2500 mg | INTRAMUSCULAR | Status: DC | PRN
  Administered 2018-11-02 (×4): 0.5 mg via INTRAVENOUS

## 2018-11-02 MED ORDER — ACETAMINOPHEN 325 MG PO TABS: 650.0000 mg | ORAL_TABLET | Freq: Four times a day (QID) | ORAL | Status: DC | PRN

## 2018-11-02 MED ORDER — ONDANSETRON HCL 4 MG PO TABS: 4.0000 mg | ORAL_TABLET | Freq: Four times a day (QID) | ORAL | Status: DC | PRN

## 2018-11-02 MED ORDER — SUGAMMADEX SODIUM 200 MG/2ML IV SOLN
INTRAVENOUS | Status: DC | PRN
  Administered 2018-11-02: 150 mg via INTRAVENOUS

## 2018-11-02 MED ORDER — CEFAZOLIN SODIUM-DEXTROSE 2-4 GM/100ML-% IV SOLN
INTRAVENOUS | Status: AC
  Filled 2018-11-02: qty 100

## 2018-11-02 MED ORDER — OXYCODONE HCL 5 MG PO TABS
ORAL_TABLET | ORAL | Status: AC
  Filled 2018-11-02: qty 2

## 2018-11-02 MED ORDER — MIDAZOLAM HCL 2 MG/2ML IJ SOLN
INTRAMUSCULAR | Status: AC
  Filled 2018-11-02: qty 2

## 2018-11-02 MED ORDER — OXYCODONE HCL 5 MG PO TABS
5.0000 mg | ORAL_TABLET | ORAL | Status: DC | PRN
  Administered 2018-11-02 – 2018-11-03 (×4): 10 mg via ORAL
  Filled 2018-11-02 (×3): qty 2

## 2018-11-02 MED ORDER — ACETAMINOPHEN 650 MG RE SUPP: 650.0000 mg | Freq: Four times a day (QID) | RECTAL | Status: DC | PRN

## 2018-11-02 MED ORDER — VECURONIUM BROMIDE 10 MG IV SOLR
INTRAVENOUS | Status: AC
  Filled 2018-11-02: qty 10

## 2018-11-02 MED ORDER — METHOCARBAMOL 500 MG PO TABS
500.0000 mg | ORAL_TABLET | Freq: Four times a day (QID) | ORAL | Status: DC | PRN
  Administered 2018-11-02: 500 mg via ORAL

## 2018-11-02 MED ORDER — LIDOCAINE 2% (20 MG/ML) 5 ML SYRINGE
INTRAMUSCULAR | Status: DC | PRN
  Administered 2018-11-02: 100 mg via INTRAVENOUS

## 2018-11-02 MED ORDER — ONDANSETRON HCL 4 MG/2ML IJ SOLN
INTRAMUSCULAR | Status: DC | PRN
  Administered 2018-11-02: 4 mg via INTRAVENOUS

## 2018-11-02 MED ORDER — HYDROMORPHONE HCL 1 MG/ML IJ SOLN
1.0000 mg | INTRAMUSCULAR | Status: DC | PRN
  Administered 2018-11-03 – 2018-11-05 (×7): 1 mg via INTRAVENOUS
  Filled 2018-11-02 (×7): qty 1

## 2018-11-02 MED ORDER — METHOCARBAMOL 500 MG PO TABS
ORAL_TABLET | ORAL | Status: AC
  Filled 2018-11-02: qty 1

## 2018-11-02 MED ORDER — CEFAZOLIN SODIUM-DEXTROSE 2-4 GM/100ML-% IV SOLN
2.0000 g | Freq: Three times a day (TID) | INTRAVENOUS | Status: AC
  Administered 2018-11-03 (×3): 2 g via INTRAVENOUS
  Filled 2018-11-02 (×3): qty 100

## 2018-11-02 SURGICAL SUPPLY — 96 items
BANDAGE ACE 4X5 VEL STRL LF (GAUZE/BANDAGES/DRESSINGS) ×5 IMPLANT
BANDAGE ACE 6X5 VEL STRL LF (GAUZE/BANDAGES/DRESSINGS) ×4 IMPLANT
BANDAGE ESMARK 6X9 LF (GAUZE/BANDAGES/DRESSINGS) ×5 IMPLANT
BIT DRILL 2.5 X LONG (BIT) ×3
BIT DRILL CALIBRATED 1.8MM (BIT) ×4 IMPLANT
BIT DRILL LCP QC 2X140 (BIT) ×2 IMPLANT
BIT DRILL QC 2.4X100 (BIT) ×1 IMPLANT
BIT DRILL QC 3.5X110 (BIT) ×4 IMPLANT
BIT DRILL X LONG 2.5 (BIT) IMPLANT
BLADE SURG 10 STRL SS (BLADE) ×6 IMPLANT
BLADE SURG 15 STRL LF DISP TIS (BLADE) ×1 IMPLANT
BLADE SURG 15 STRL SS (BLADE) ×4
BNDG CMPR 9X6 STRL LF SNTH (GAUZE/BANDAGES/DRESSINGS) ×6
BNDG CMPR MED 10X6 ELC LF (GAUZE/BANDAGES/DRESSINGS) ×6
BNDG COHESIVE 4X5 TAN STRL (GAUZE/BANDAGES/DRESSINGS) ×4 IMPLANT
BNDG ELASTIC 6X10 VLCR STRL LF (GAUZE/BANDAGES/DRESSINGS) ×2 IMPLANT
BNDG ESMARK 6X9 LF (GAUZE/BANDAGES/DRESSINGS) ×8
BRUSH SCRUB SURG 4.25 DISP (MISCELLANEOUS) ×8 IMPLANT
CHLORAPREP W/TINT 26ML (MISCELLANEOUS) ×4 IMPLANT
CLEANER TIP ELECTROSURG 2X2 (MISCELLANEOUS) ×3 IMPLANT
CONNECTOR 5 IN 1 STRAIGHT STRL (MISCELLANEOUS) ×4 IMPLANT
COVER MAYO STAND STRL (DRAPES) ×4 IMPLANT
COVER SURGICAL LIGHT HANDLE (MISCELLANEOUS) ×8 IMPLANT
COVER WAND RF STERILE (DRAPES) ×4 IMPLANT
DRAPE C-ARM 42X72 X-RAY (DRAPES) ×8 IMPLANT
DRAPE C-ARMOR (DRAPES) ×8 IMPLANT
DRAPE EXTREMITY BILATERAL (DRAPES) ×1 IMPLANT
DRAPE HALF SHEET 40X57 (DRAPES) ×2 IMPLANT
DRAPE INCISE IOBAN 66X45 STRL (DRAPES) ×6 IMPLANT
DRAPE ORTHO SPLIT 77X108 STRL (DRAPES) ×8
DRAPE SURG ORHT 6 SPLT 77X108 (DRAPES) ×6 IMPLANT
DRAPE U-SHAPE 47X51 STRL (DRAPES) ×4 IMPLANT
DRILL BIT X LONG 2.5 (BIT) ×4
DRILL CALIBRATED 1.8MM (BIT) ×8
DRSG ADAPTIC 3X8 NADH LF (GAUZE/BANDAGES/DRESSINGS) ×6 IMPLANT
DRSG EMULSION OIL 3X3 NADH (GAUZE/BANDAGES/DRESSINGS) ×4 IMPLANT
ELECT REM PT RETURN 9FT ADLT (ELECTROSURGICAL) ×4
ELECTRODE REM PT RTRN 9FT ADLT (ELECTROSURGICAL) ×3 IMPLANT
GAUZE SPONGE 4X4 12PLY STRL (GAUZE/BANDAGES/DRESSINGS) ×7 IMPLANT
GLOVE BIO SURGEON STRL SZ7.5 (GLOVE) ×16 IMPLANT
GLOVE BIOGEL PI IND STRL 7.5 (GLOVE) ×3 IMPLANT
GLOVE BIOGEL PI INDICATOR 7.5 (GLOVE) ×1
GOWN STRL REUS W/ TWL LRG LVL3 (GOWN DISPOSABLE) ×6 IMPLANT
GOWN STRL REUS W/TWL LRG LVL3 (GOWN DISPOSABLE) ×8
KIT BASIN OR (CUSTOM PROCEDURE TRAY) ×4 IMPLANT
KIT TURNOVER KIT B (KITS) ×4 IMPLANT
MANIFOLD NEPTUNE II (INSTRUMENTS) ×4 IMPLANT
NDL HYPO 21X1.5 SAFETY (NEEDLE) IMPLANT
NEEDLE HYPO 21X1.5 SAFETY (NEEDLE) IMPLANT
NS IRRIG 1000ML POUR BTL (IV SOLUTION) ×4 IMPLANT
PACK ORTHO EXTREMITY (CUSTOM PROCEDURE TRAY) ×4 IMPLANT
PAD ABD 8X10 STRL (GAUZE/BANDAGES/DRESSINGS) ×6 IMPLANT
PAD ARMBOARD 7.5X6 YLW CONV (MISCELLANEOUS) ×8 IMPLANT
PAD CAST 4YDX4 CTTN HI CHSV (CAST SUPPLIES) ×6 IMPLANT
PADDING CAST COTTON 4X4 STRL (CAST SUPPLIES) ×8
PADDING CAST COTTON 6X4 STRL (CAST SUPPLIES) ×7 IMPLANT
PLATE 8 HOLE LEFT DISTAL TIBIA (Plate) ×1 IMPLANT
PLATE CALCNEAL (Plate) ×1 IMPLANT
SCREW CORTEX LOW PRO 3.5X28 (Screw) ×6 IMPLANT
SCREW CORTEX SLFTPNG 32MM 2.4 (Screw) ×1 IMPLANT
SCREW CORTEX SLFTPNG 34MM 2.4 (Screw) ×1 IMPLANT
SCREW CORTEX SLFTPNG 36MM 2.4 (Screw) ×1 IMPLANT
SCREW LOCK SELF-TAP 2.7X52MM (Screw) ×4 IMPLANT
SCREW LOCK T8 22X2.7XST VA (Screw) IMPLANT
SCREW LOCK VA ST 2.7X20 (Screw) ×2 IMPLANT
SCREW LOCK VA ST 2.7X32 (Screw) ×4 IMPLANT
SCREW LOCKING 2.7X22MM (Screw) ×4 IMPLANT
SCREW LOCKING 2.7X28 (Screw) ×2 IMPLANT
SCREW LOCKING 2.7X44MM VA (Screw) ×2 IMPLANT
SCREW LOCKING VA 2.7X34MM (Screw) ×4 IMPLANT
SCREW LOCKING VA 2.7X48 (Screw) ×2 IMPLANT
SCREW SHANZ 4.0X60MM (EXFIX) IMPLANT
SCREW STARDRV ST VA 2.7X52 (Screw) ×1 IMPLANT
SCREW VA LOCKING 2.7X36 (Screw) ×4 IMPLANT
SCREW VA LOCKING 2.7X36 VA (Screw) IMPLANT
SPLINT PLASTER CAST XFAST 5X30 (CAST SUPPLIES) ×2 IMPLANT
SPLINT PLASTER XFAST SET 5X30 (CAST SUPPLIES) ×2
SPONGE LAP 18X18 RF (DISPOSABLE) ×8 IMPLANT
SPONGE LAP 18X18 X RAY DECT (DISPOSABLE) IMPLANT
SUCTION FRAZIER HANDLE 10FR (MISCELLANEOUS) ×1
SUCTION FRAZIER TIP 10 FR DISP (SUCTIONS) ×4 IMPLANT
SUCTION TUBE FRAZIER 10FR DISP (MISCELLANEOUS) ×3 IMPLANT
SUT ETHILON 3 0 PS 1 (SUTURE) ×20 IMPLANT
SUT VIC AB 0 CT1 27 (SUTURE) ×8
SUT VIC AB 0 CT1 27XBRD ANBCTR (SUTURE) ×4 IMPLANT
SUT VIC AB 2-0 CT1 27 (SUTURE) ×12
SUT VIC AB 2-0 CT1 TAPERPNT 27 (SUTURE) ×6 IMPLANT
SYR CONTROL 10ML LL (SYRINGE) IMPLANT
TOWEL OR 17X24 6PK STRL BLUE (TOWEL DISPOSABLE) ×4 IMPLANT
TOWEL OR 17X26 10 PK STRL BLUE (TOWEL DISPOSABLE) ×8 IMPLANT
TUBE CONNECTING 12X1/4 (SUCTIONS) ×8 IMPLANT
UNDERPAD 30X30 (UNDERPADS AND DIAPERS) ×4 IMPLANT
WATER STERILE IRR 1000ML POUR (IV SOLUTION) ×4 IMPLANT
WIRE COMPRESSION 1.6X20 (WIRE) ×1 IMPLANT
WIRE COMPRESSION 1.6X25 (WIRE) ×2 IMPLANT
YANKAUER SUCT BULB TIP NO VENT (SUCTIONS) ×2 IMPLANT

## 2018-11-02 NOTE — Transfer of Care (Signed)
Immediate Anesthesia Transfer of Care Note  Patient: Barry Silva  Procedure(s) Performed: OPEN REDUCTION INTERNAL FIXATION (ORIF) LEFT PILON FRACTURE (Left Leg Lower) OPEN REDUCTION INTERNAL FIXATION (ORIF) CALCANEOUS FRACTURE (Right Foot) REMOVAL EXTERNAL FIXATION LEG (Leg Lower)  Patient Location: PACU  Anesthesia Type:General  Level of Consciousness: drowsy  Airway & Oxygen Therapy: Patient Spontanous Breathing  Post-op Assessment: Report given to RN and Post -op Vital signs reviewed and stable  Post vital signs: Reviewed and stable  Last Vitals:  Vitals Value Taken Time  BP 142/85 11/02/2018  3:47 PM  Temp    Pulse 95 11/02/2018  3:52 PM  Resp 13 11/02/2018  3:52 PM  SpO2 95 % 11/02/2018  3:52 PM  Vitals shown include unvalidated device data.  Last Pain:  Vitals:   11/02/18 0751  TempSrc:   PainSc: 5          Complications: No apparent anesthesia complications

## 2018-11-02 NOTE — Anesthesia Procedure Notes (Signed)
Procedure Name: Intubation Date/Time: 11/02/2018 9:18 AM Performed by: Renato Shin, CRNA Pre-anesthesia Checklist: Patient identified, Emergency Drugs available, Suction available and Patient being monitored Patient Re-evaluated:Patient Re-evaluated prior to induction Oxygen Delivery Method: Circle system utilized Preoxygenation: Pre-oxygenation with 100% oxygen Induction Type: IV induction Ventilation: Mask ventilation without difficulty Laryngoscope Size: Miller and 3 Grade View: Grade I Tube type: Oral Tube size: 7.5 mm Number of attempts: 1 Airway Equipment and Method: Stylet Placement Confirmation: ETT inserted through vocal cords under direct vision,  positive ETCO2 and breath sounds checked- equal and bilateral Secured at: 22 cm Tube secured with: Tape Dental Injury: Teeth and Oropharynx as per pre-operative assessment

## 2018-11-02 NOTE — Interval H&P Note (Signed)
History and Physical Interval Note:  11/02/2018 8:49 AM  Barry Silva  has presented today for surgery, with the diagnosis of Left pilon fracture, right calcaneus fracture  The various methods of treatment have been discussed with the patient and family. After consideration of risks, benefits and other options for treatment, the patient has consented to  Procedure(s): OPEN REDUCTION INTERNAL FIXATION (ORIF) LEFT PILON FRACTURE (Left) OPEN REDUCTION INTERNAL FIXATION (ORIF) CALCANEOUS FRACTURE (Right) as a surgical intervention .  The patient's history has been reviewed, patient examined, no change in status, stable for surgery.  I have reviewed the patient's chart and labs.  Questions were answered to the patient's satisfaction.     Lennette Bihari P Donis Kotowski

## 2018-11-02 NOTE — Op Note (Signed)
OrthopaedicSurgeryOperativeNote (UXL:[id number]) Date of Surgery: 11/02/2018  Admit Date: 11/02/2018   Diagnoses: Pre-Op Diagnoses: Left closed pilon fracture Right calcaneus fracture  Post-Op Diagnosis: Same  Procedures: 1. CPT 27827-Open reduction internal fixation of left pilon fracture 2. CPT 20694-Removal of external fixator 3. CPT 28415-Open reduction of right calcaneus fracture  Surgeons: Primary: Shona Needles, MD   Assistant: Ainsley Spinner, PA-C  Location:[facility] OR ROOM 03   AnesthesiaGeneral   Antibiotics:Ancef 2g preop with redosing at 4 hours   Tourniquettime: Total Tourniquet Time Documented: Thigh (Left) - 131 minutes Thigh (Left) - 121 minutes Total: Thigh (Left) - 252 minutes  [id number]:756 mL   Complications:None  Specimens:None  Implants: Implant Name Type Inv. Item Serial No. Manufacturer Lot No. LRB No. Used Action  SCREW CORTEX LOW PRO 3.5X28 - [id number] Screw SCREW CORTEX LOW PRO 3.5X28  SYNTHES TRAUMA  Left 3 Implanted  PLATE 8 HOLE LEFT DISTAL TIBIA - [id number] Plate PLATE 8 HOLE LEFT DISTAL TIBIA  SYNTHES TRAUMA  Left 1 Implanted  SCREW STARDRV ST VA 2.7X52 - [id number] Screw SCREW STARDRV ST VA 2.7X52  STRYKER TRAUMA  Left 1 Implanted  SCREW LOCKING VA 2.7X48 - [id number] Screw SCREW LOCKING VA 2.7X48  SYNTHES TRAUMA  Left 2 Implanted  SCREW LOCK SELF-TAP 2.7X52MM - [id number] Screw SCREW LOCK SELF-TAP 2.7X52MM  SYNTHES TRAUMA  Left 3 Implanted  SCREW LOCKING 2.7X20MM - [id number] Screw SCREW LOCKING 2.7X20MM  SYNTHES TRAUMA  Left 1 Implanted  SCREW CORTEX SLFTPNG 36MM 2.4 - [id number] Screw SCREW CORTEX SLFTPNG 36MM 2.4  SYNTHES TRAUMA  Left 1 Implanted  SCREW LOCKING 2.7X44MM VA - [id number] Screw SCREW LOCKING 2.7X44MM VA  SYNTHES [employer]UMA  Left 1 Implanted  PLATE CALCNEAL - [id number] Plate PLATE CALCNEAL  SYNTHES TRAUMA  Right 1 Implanted  SCREW CORTEX SLFTPNG 34MM 2.4 - [id number] Screw SCREW CORTEX SLFTPNG 34MM 2.4  SYNTHES  TRAUMA  Right 1 Implanted  SCREW CORTEX SLFTPNG 32MM 2.4 - [id number] Screw SCREW CORTEX SLFTPNG 32MM 2.4  SYNTHES TRAUMA  Right 1 Implanted  SCREW LOCKING 2.7X22MM - [id number] Screw SCREW LOCKING 2.7X22MM  SYNTHES TRAUMA  Right 1 Implanted  SCREW LOCKING 2.7X32 - [id number] Screw SCREW LOCKING 2.7X32  SYNTHES TRAUMA  Right 2 Implanted  SCREW LOCKING VA 2.7X34MM - [id number] Screw SCREW LOCKING VA 2.7X34MM  SYNTHES TRAUMA  Right 2 Implanted  SCREW VA LOCKING 2.7X36 - [id number] Screw SCREW VA LOCKING 2.7X36  SYNTHES TRAUMA  Right 1 Implanted  SCREW LOCKING 2.7X28 - [id number] Screw SCREW LOCKING 2.7X28  SYNTHES TRAUMA  Right 1 Implanted    IndicationsforSurgery: 57 year old male who sustained an injury while rockclimbing.  He had a left intra-articular pilon fracture with anterior subluxation/dislocation.  He underwent closed reduction and external fixation approximately 1 week ago.  He also sustained an intra-articular calcaneus fracture on the right at that was significantly displaced that I felt.  Needed open reduction internal fixation.  He was discharged home and return to clinic at which point his swelling was more appropriate.  I felt that we could proceed with open reduction internal fixation of both legs.  The risks and benefits were discussed with the patient and his wife. Risks discussed included bleeding requiring blood transfusion, bleeding causing a hematoma, infection, malunion, nonunion, damage to surrounding nerves and blood vessels, pain, hardware prominence or irritation, hardware failure, stiffness, post-traumatic arthritis, DVT/PE, compartment syndrome.  They agreed to proceed with surgery and consent was obtained.  Operative Findings: 1.  Open reduction internal fixation of left pilon fracture  using Synthes anterior lateral VA distal tibia plate with a independent 2.4 millimeter screw from mini frag set. 2.  Removal of external fixation from the left ankle. 3.  Open reduction  internal fixation of right Sanders 2 calcaneus fracture using Synthes VA calcaneus plate through a sinus tarsi approach.  Procedure: The patient was identified in the preoperative holding area. Consent was confirmed with the patient and their family and all questions were answered. The operative extremity was marked after confirmation with the patient. he was then brought back to the operating room by our anesthesia colleagues.  He was carefully transferred over to a radiolucent flat top table.  He was placed under general anesthetic.  Bilateral upper thigh tourniquets were placed.  Bilateral lower extremities were then prepped and draped in usual sterile fashion.  I first started out with the left lower extremity in the right lower extremity was covered.  Preoperative antibiotics were dosed.  A timeout was performed to verify the patient, the procedures and the extremities.  The external fixator was removed non-sterilely before the leg was prepped.  The ex-fix pin sites were covered through the procedure to decrease the risk of contamination.  An Esmarch was used to exsanguinate the extremity and the tourniquet was inflated to 300 mmHg.  Total tourniquet time was noted above.  A standard anterior lateral incision was made to the distal tibia.  Is carried through skin and subcutaneous tissue.  The superficial peroneal nerve was identified and protected throughout the case.  The extensor retinaculum just lateral to the extensor tendons of was incised to mobilize the anterior compartment musculature.  A submuscular dissection was performed to visualize the anterior medial aspect of the distal tibia.  Here the clot and free bony fragments were removed and kept for later bone grafting.  The articular surface was significantly involved.  There was a large Tillaux fragment with the anterior and inferior patch.  There was a large posterior malleolus fragment with a large metaphyseal spike to it.  We will malleolus  fragment also had a metaphyseal portion to it.  There was an impacted free fragment in the center of the pilon fracture.  There is also a free osteochondral fragment that was placed on the back table for later repair.  The fracture lines at the metaphysis and shaft were relatively simple and a attempt was made for anatomic reduction and lag fixation.  However the posterior fragment unable to be fully visualized due to the soft tissue and fibula that were in the way.  Anterior medial fragment was reduced back to the shaft and clamped there and held provisionally.  Articular surface was then able to be reconstructed to the talus.  The talus was confirmed to be in a located position with fluoroscopic imaging.  A number of K wires were used to provisionally hold the joint reduced anterior lateral fragment was then reduced back and held provisionally with a clamp a number of K wires were used to reinforce this provisional fixation.  The free osteochondral fragment was reduced into an anatomic reduction prior to reducing the anterior lateral segment.  Fluoroscopic images were used to confirm adequate joint reduction and alignment of the metaphysis to the shaft.  A 3.5 mm lag screw was then placed from the shaft to the anterior medial metaphyseal segment.  Excellent fixation was obtained and one of the clamps was then removed.  A number of K wires were then placed in the anterior lateral fragment and the second clamp  was removed.  A anterior lateral distal tibia via a Synthes plate was then contoured and placed in appropriate location along the anterior lateral aspect of the tibia.  It was held provisionally with K wires.  A percutaneous incision was then made along the shaft to place a 3.5 millimeter screw to hold the proximal portion of the plate to the tibial shaft.  A metaphyseal 2.7 millimeters screw was used to bring the plate flush to bone distally.  A number of 2.7 mm locking screws were placed in the distal  articular block to hold the joint reduction in place.  Nonlocking screws were placed in the shaft to hold the plate in the shaft component together.  The K wires were removed at the distal articular block.  Part of the anterior lateral fragment was still loose and as a result I provisionally held this with a K wire and then placed a positional 2.4 millimeters screw to hold this.  It was directed from distal to proximal and anterior to posterior.  Final fluoroscopic images were obtained.  The tourniquet was deflated.  The incision was copiously irrigated.  A gram of vancomycin powder was placed into the wound.  The capsule was closed prior to the placement of the antibiotic powder.  The extensor retinaculum was repaired.  A layered closure consisting of 2-0 Vicryl and 3-0 nylon was performed.  Percutaneous incisions were closed with 3-0 nylon.  The incisions were then covered with Ioban and a new drape was placed for the right lower extremity.  Fluoroscopy was used to mark out a sinus tarsi approach centered over the subtalar joint at the posterior facet. The incision extended from the anterior process to the posterior aspect of the posterior facet. An esmarch was used to exsanguinate the leg and the tourniquet was inflated to 354mmHg for a total tourniquet time noted above. An incision was made through skin and subcutaneous tissue. The sural nerve was not identified in our approach and we were careful not to damage it. The EDB fascia was incised along the length of our incision and I came sharply down to bone at the angle of Gissane. The fascia and soft tissue was subperiosteally dissected off of the the anterior process of the calcaneus to fully visualize the fracture.  The subtalar joint was exposed carefully by subperiosteal dissection. Dissection was carried posteriorly until the peroneal tendons were identified. The tendons were still in their sheath and were protected throughout the case.  Fluoroscopy  was used to identify and appropriate starting point for a laterally based Shanz pin to correct the shortening and varus. A percutaneous incision was made and a 5.48mm pin was placed and a T handle chuck was used to manipulate the body of the calcaneus.  A Cobb elevator was used to enter the body of the calcaneus and manipulate the lateral impacted joint segment of the posterior facet. The articular fragment was manipulated into place and held provisionally with 1.64mm K-wires across the fracture and into the constant fragment. I judged reduction using a freer and direct palpation, as well as a Harris heel view and Broaden's view. After multiple attempts at reduction I was pleased with final reduction clinically and fluoroscopically.  A 2.4 mm lag screw was used to provisionally hold this joint reduction.  Another positional screw was placed just posterior to this.  Location was confirmed with fluoroscopic imaging.  The anterior process fracture that entered the angle of Gissane was reduced and held provisionally with a K-wire.  Fluoroscopic images lateral and Harris heel view showed excellent alignment of the calcaneus.  A Synthes VA calcaneus plate was then slid underneath the peroneal tendons aligned appropriately on her scopic imaging.  Threaded K wires were used to compress the plate down to bone.  I then placed locking screws in the anterior process as well as the posterior facet.  I also was able to place 3 percutaneous incision 2.7 mm locking screws into the tuberosity fragments.  Good fixation was obtained.  Fluoroscopic imaging was confirmed to show adequate placement of all screws.  The tourniquet was deflated and adequate hemostasis was obtained. A layered closure was performed with 0 vicryl, 2-0 vicryl, and 3-0 nylon.  Sterile dressing was then placed on both lower extremities consisting of bacitracin ointment, Adaptic, 4 x 4's, ABD pads and sterile cast padding.  Well-padded short leg splints were  then placed on both lower extremities.  Was then awoken from anesthesia and taken to PACU in stable condition.  Post Op Plan/Instructions: The patient will be nonweightbearing to bilateral lower extremities.  He will receive Lovenox for DVT prophylaxis.  He will receive postoperative Ancef.  He will likely be discharged home on postoperative day 1 or 2 depending on pain control.  I was present and performed the entire surgery.  Ainsley Spinner, PA-C did assist me throughout the case. An assistant was necessary given the difficulty in approach, maintenance of reduction and ability to instrument the fracture.   Katha Hamming, MD [facility]

## 2018-11-02 NOTE — Anesthesia Preprocedure Evaluation (Addendum)
Anesthesia Evaluation  Patient identified by MRN, date of birth, ID band Patient awake    Reviewed: Allergy & Precautions, NPO status , Patient's Chart, lab work & pertinent test results  Airway Mallampati: I  TM Distance: >3 FB Neck ROM: Full    Dental  (+) Chipped, Dental Advisory Given,    Pulmonary  Sarcoidosis   Pulmonary exam normal breath sounds clear to auscultation       Cardiovascular hypertension, Pt. on medications Normal cardiovascular exam Rhythm:Regular Rate:Normal  ECG: rate 93. Normal sinus rhythm Possible Left atrial enlargement Incomplete right bundle branch block   Neuro/Psych  Headaches, PSYCHIATRIC DISORDERS    GI/Hepatic negative GI ROS, Neg liver ROS,   Endo/Other  negative endocrine ROS  Renal/GU negative Renal ROS     Musculoskeletal negative musculoskeletal ROS (+)   Abdominal   Peds  Hematology  (+) JEHOVAH'S WITNESS  Anesthesia Other Findings Left pilon fracture Right calcaneus fracture  Reproductive/Obstetrics                           Anesthesia Physical Anesthesia Plan  ASA: III  Anesthesia Plan: General   Post-op Pain Management:    Induction: Intravenous  PONV Risk Score and Plan: 3 and Midazolam, Dexamethasone, Ondansetron and Treatment may vary due to age or medical condition  Airway Management Planned: Oral ETT  Additional Equipment:   Intra-op Plan:   Post-operative Plan: Extubation in OR  Informed Consent: I have reviewed the patients History and Physical, chart, labs and discussed the procedure including the risks, benefits and alternatives for the proposed anesthesia with the patient or authorized representative who has indicated his/her understanding and acceptance.   Dental advisory given  Plan Discussed with: CRNA  Anesthesia Plan Comments:         Anesthesia Quick Evaluation

## 2018-11-03 ENCOUNTER — Encounter (HOSPITAL_COMMUNITY): Payer: Self-pay | Admitting: Orthopedic Surgery

## 2018-11-03 DIAGNOSIS — S92001A Unspecified fracture of right calcaneus, initial encounter for closed fracture: Secondary | ICD-10-CM | POA: Diagnosis not present

## 2018-11-03 MED ORDER — METHOCARBAMOL 750 MG PO TABS
750.0000 mg | ORAL_TABLET | Freq: Three times a day (TID) | ORAL | Status: DC
  Administered 2018-11-03 – 2018-11-05 (×6): 750 mg via ORAL
  Filled 2018-11-03 (×6): qty 1

## 2018-11-03 MED ORDER — GABAPENTIN 100 MG PO CAPS
200.0000 mg | ORAL_CAPSULE | Freq: Three times a day (TID) | ORAL | Status: DC
  Administered 2018-11-03 – 2018-11-05 (×6): 200 mg via ORAL
  Filled 2018-11-03 (×7): qty 2

## 2018-11-03 MED ORDER — OXYCODONE HCL 5 MG PO TABS
10.0000 mg | ORAL_TABLET | ORAL | Status: DC | PRN
  Administered 2018-11-03 – 2018-11-05 (×11): 15 mg via ORAL
  Filled 2018-11-03 (×11): qty 3

## 2018-11-03 MED ORDER — ACETAMINOPHEN 500 MG PO TABS
1000.0000 mg | ORAL_TABLET | Freq: Three times a day (TID) | ORAL | Status: DC
  Administered 2018-11-03 – 2018-11-05 (×6): 1000 mg via ORAL
  Filled 2018-11-03 (×6): qty 2

## 2018-11-03 NOTE — Plan of Care (Signed)

## 2018-11-03 NOTE — Progress Notes (Signed)
Orthopedic Trauma Service Progress Note  Patient ID: Barry Silva MRN: [id number] DOB/AGE: 23-Feb-1961 57 y.o.  Subjective:  Doing fair Pain pretty severe overnight, requiring IV meds  No other complaints  Denies any burning or tingling pain in L foot/ankle   Review of Systems  Constitutional: Negative for chills and fever.  Respiratory: Negative for shortness of breath and wheezing.   Cardiovascular: Negative for chest pain and palpitations.  Gastrointestinal: Negative for nausea and vomiting.  Neurological: Negative for tingling and sensory change.    Objective:   VITALS:   Vitals:   11/02/18 2048 11/02/18 2337 11/03/18 0441 11/03/18 0815  BP: 139/82 120/84 129/82 133/85  Pulse: 92 97 (!) 101 96  Resp: 18   16  Temp: 98.4 F (36.9 C) 98.7 F (37.1 C) 99.8 F (37.7 C) 99.2 F (37.3 C)  TempSrc: Oral Oral Oral Oral  SpO2: 99% 95% 96% 95%  Weight:      Height:        Estimated body mass index is 25.03 kg/m as calculated from the following:   Height as of this encounter: 5\' 7"  (1.702 m).   Weight as of this encounter: 72.5 kg.   Intake/Output      11/15 0701 - 11/16 0700 11/16 0701 - 11/17 0700   P.O.  240   I.V. (mL/kg) 4400 (60.7)    IV Piggyback 100    Total Intake(mL/kg) 4500 (62.1) 240 (3.3)   Urine (mL/kg/hr) 3000    Blood 150    Total Output 3150    Net +1350 +240          LABS  No results found for this or any previous visit (from the past 24 hour(s)).   PHYSICAL EXAM:   Gen: resting comfortably in bed, very pleasant Lungs: CTA B  Cardiac: RRR  Abd: + BS, NTND  Ext:       B Lower Extremities  B SLS are fitting well  Swelling stable B   EHL, FHL, leser to motor intact B  DPN, SPN, TN sensation at baseline B   exts are warm   + DP pulses  No pain with passive stretching   Assessment/Plan: 1 Day Post-Op   Active Problems:   Closed fracture of left distal  tibia   Closed displaced fracture of right calcaneus   Closed left pilon fracture, initial encounter   Anti-infectives (From admission, onward)   Start     Dose/Rate Route Frequency Ordered Stop   11/02/18 2300  ceFAZolin (ANCEF) IVPB 2g/100 mL premix     2 g 200 mL/hr over 30 Minutes Intravenous Every 8 hours 11/02/18 2144 11/03/18 2159   11/02/18 1315  vancomycin (VANCOCIN) powder  Status:  Discontinued       As needed 11/02/18 1316 11/02/18 1544   11/02/18 1227  vancomycin (VANCOCIN) powder  Status:  Discontinued       As needed 11/02/18 1230 11/02/18 1544   11/02/18 0915  ceFAZolin (ANCEF) IVPB 2g/100 mL premix     2 g 200 mL/hr over 30 Minutes Intravenous On call to O.R. 11/02/18 0729 11/02/18 1327   11/02/18 0720  ceFAZolin (ANCEF) 2-4 GM/100ML-% IVPB    Note to Pharmacy:  Starleen Arms   : cabinet override      11/02/18 0720 11/02/18 0921    .  POD/HD#: 1  57 y/o male s/p fall from height about 2 weeks ago with complex L pilon fracture and complex R calcaneus fracture   -L pilon fracture s/p ORIF, R calcaneus fracture s/p ORIF  NWB B LEx   Bed to chair transfers only  Continue with splint until follow up appointment   Ice and elevate legs as much as possible, elevate above heart level   PT/OT      - Pain management:  Made adjustments to schedule    - ABL anemia/Hemodynamics  Stable  - Medical issues   No chronic issues  - DVT/PE prophylaxis:  Lovenox  - ID:   periop abx   - Activity:  NWB B LEx  Bed to chair transfers only   - FEN/GI prophylaxis/Foley/Lines:  Reg diet   - Impediments to fracture healing:  High energy trauma   Severe soft tissue injury   - Dispo:  PT/OT  Optimize pain control  Hopeful for dc home tomorrow    Jari Pigg, PA-C [phone] (C) 11/03/2018, 10:04 AM  [facility] [place] [address] [phone] Domingo Sep (F)

## 2018-11-03 NOTE — Evaluation (Signed)
Occupational Therapy Evaluation Patient Details Name: Barry Silva MRN: [id number] DOB: 1961/10/16 Today's Date: 11/03/2018    History of Present Illness Barry Silva is a 57yo male who comes to [facility] after a ~2foot fall while rock climbing at the gym, sustaining Rt calcaneal fracture, and left tibial fracture. Pt underwent Rt ORIF and Left OREF, expected to undergo further surgical intervention once swelling has decreased. Pt is NWB bilat.    Clinical Impression   This 57 yo male admitted and underwent above presents to acute OT without any further needs. Moving well and has wife to A prn.    Follow Up Recommendations  No OT follow up;Supervision - Intermittent    Equipment Recommendations  None recommended by OT       Precautions / Restrictions Precautions Precautions: None Restrictions Weight Bearing Restrictions: Yes RLE Weight Bearing: Non weight bearing LLE Weight Bearing: Non weight bearing      Mobility Bed Mobility Overal bed mobility: Independent Bed Mobility: (Semirecumbent 90 degree rotation in bed. )              Transfers Overall transfer level: Needs assistance Equipment used: None Transfers: Comptroller transfers: Supervision   General transfer comment: supervision for line management, no physical assist, anticipate will be independent without lines    Balance Overall balance assessment: No apparent balance deficits (not formally assessed)                                         ADL either performed or assessed with clinical judgement   ADL Overall ADL's : Needs assistance/impaired                                       General ADL Comments: Wife A him with LB ADLs prn; but now with external fixators removed he should be able to do more by himself once pain from surgery lessens. We did talk about how to keep legs dry if he decides he wants to get in shower sooner  than later.     Vision Baseline Vision/History: No visual deficits Patient Visual Report: No change from baseline              Pertinent Vitals/Pain Pain Assessment: 0-10 Pain Score: 7  Pain Location: Bilateral LEs Pain Descriptors / Indicators: Discomfort Pain Intervention(s): Monitored during session;Repositioned     Hand Dominance Right   Extremity/Trunk Assessment Upper Extremity Assessment Upper Extremity Assessment: Overall WFL for tasks assessed   Lower Extremity Assessment Lower Extremity Assessment: RLE deficits/detail;LLE deficits/detail RLE: Unable to fully assess due to immobilization LLE: Unable to fully assess due to immobilization       Communication Communication Communication: No difficulties   Cognition Arousal/Alertness: Awake/alert Behavior During Therapy: WFL for tasks assessed/performed Overall Cognitive Status: Within Functional Limits for tasks assessed                                                Home Living Family/patient expects to be discharged to:: Private residence Living Arrangements: Spouse/significant other Available Help at Discharge: Family Type of Home: House(2 story townhome) Home Access: Stairs to enter Entrance  Stairs-Number of Steps: 1 (partial step) (created a ramp)   Home Layout: Two level;Able to live on main level with bedroom/bathroom     Bathroom Shower/Tub: Tub/shower unit;Curtain   Biochemist, clinical: Standard     Home Equipment: Hospital bed;Tub bench;Wheelchair - manual          Prior Functioning/Environment Level of Independence: Independent                 OT Problem List: Decreased range of motion;Impaired balance (sitting and/or standing);Pain         OT Goals(Current goals can be found in the care plan section) Acute Rehab OT Goals Patient Stated Goal: manage pain, return to home in [facility]   OT Frequency:             Co-evaluation PT/OT/SLP Co-Evaluation/Treatment:  Yes Reason for Co-Treatment: Necessary to address cognition/behavior during functional activity PT goals addressed during session: Mobility/safety with mobility OT goals addressed during session: ADL's and self-care      AM-PAC PT "6 Clicks" Daily Activity     Outcome Measure Help from another person eating meals?: None Help from another person taking care of personal grooming?: A Little Help from another person toileting, which includes using toliet, bedpan, or urinal?: A Little Help from another person bathing (including washing, rinsing, drying)?: A Little Help from another person to put on and taking off regular upper body clothing?: A Little Help from another person to put on and taking off regular lower body clothing?: A Little 6 Click Score: 19   End of Session Equipment Utilized During Treatment: (none)  Activity Tolerance: Patient tolerated treatment well Patient left: in chair;with call bell/phone within reach;with family/visitor present  OT Visit Diagnosis: Other abnormalities of gait and mobility (R26.89);Pain Pain - Right/Left: (both) Pain - part of body: Leg                Time: 0086-7619 OT Time Calculation (min): 21 min Charges:  OT General Charges $OT Visit: 1 Visit OT Evaluation $OT Eval Low Complexity: 1 Low  [address], OTR/L Acute [employer] Pager [phone] Office [phone]     Almon Register 11/03/2018, 4:59 PM

## 2018-11-03 NOTE — Evaluation (Addendum)
Physical Therapy Evaluation Patient Details Name: Barry Silva MRN: [id number] DOB: 05/30/1961 Today's Date: 11/03/2018   History of Present Illness  Barry Silva is a 57yo male who comes to [facility] after a ~25foot fall while rock climbing at the gym, sustaining Rt calcaneal fracture, and left tibial fracture with original external fixation. Pt readmitted and underwent surgical intervention with bilateral ORIFs. Pt is NWB bilat.   Clinical Impression  Patient seen for mobility assessment s/p bilateral LE ORIF surgery. Mobilizing well with OOB to chair transfers. Educated patient on precautions, mobility expectations, safety and positioning. Feel patient will be safe for d/c home to resume previously arranged plan for recovery. HHPT at this time and once cleared for outpatient services, recommend transition to outpatient. No further acute PT needs. Will sign off.     Follow Up Recommendations Home health PT;Supervision - Intermittent(with progression to outpatient once cleared)    Equipment Recommendations  Wheelchair cushion (measurements PT);Hospital bed;Wheelchair (measurements PT)(has chair and bed from initial accident)    Recommendations for Other Services OT consult     Precautions / Restrictions Precautions Precautions: None Restrictions Weight Bearing Restrictions: Yes RLE Weight Bearing: Non weight bearing LLE Weight Bearing: Non weight bearing      Mobility  Bed Mobility Overal bed mobility: Independent Bed Mobility: (Semirecumbent 90 degree rotation in bed. )              Transfers Overall transfer level: Needs assistance Equipment used: None Transfers: Anterior-Posterior Transfer       Anterior-Posterior transfers: Supervision   General transfer comment: supervision for line management, no physical assist, anticipate will be independent without lines  Ambulation/Gait                Stairs            Wheelchair Mobility    Modified  Rankin (Stroke Patients Only)       Balance Overall balance assessment: No apparent balance deficits (not formally assessed)                                           Pertinent Vitals/Pain Pain Assessment: 0-10 Pain Score: 7  Pain Location: Bilateral LEs Pain Descriptors / Indicators: Discomfort Pain Intervention(s): Monitored during session;Repositioned    Home Living Family/patient expects to be discharged to:: Private residence Living Arrangements: Spouse/significant other Available Help at Discharge: Family Type of Home: House(2 story townhome) Home Access: Stairs to enter   Entrance Stairs-Number of Steps: 1 (partial step) (created a ramp) Home Layout: Two level;1/2 bath on main level Home Equipment: None      Prior Function Level of Independence: Independent               Hand Dominance   Dominant Hand: Right    Extremity/Trunk Assessment   Upper Extremity Assessment Upper Extremity Assessment: Overall WFL for tasks assessed    Lower Extremity Assessment Lower Extremity Assessment: RLE deficits/detail;LLE deficits/detail RLE: Unable to fully assess due to immobilization LLE: Unable to fully assess due to immobilization       Communication      Cognition Arousal/Alertness: Awake/alert Behavior During Therapy: WFL for tasks assessed/performed Overall Cognitive Status: Within Functional Limits for tasks assessed                                          General Comments      Exercises     Assessment/Plan    PT Assessment All further PT needs can be met in the next venue of care  PT Problem List Decreased range of motion;Decreased mobility;Decreased activity tolerance       PT Treatment Interventions      PT Goals (Current goals can be found in the Care Plan section)  Acute Rehab PT Goals Patient Stated Goal: manage pain, return to home in [facility]  PT Goal Formulation: With patient Time For Goal Achievement:  11/09/18 Potential to Achieve Goals: Good    Frequency     Barriers to discharge        Co-evaluation PT/OT/SLP Co-Evaluation/Treatment: Yes Reason for Co-Treatment: Necessary to address cognition/behavior during functional activity PT goals addressed during session: Mobility/safety with mobility OT goals addressed during session: ADL's and self-care       AM-PAC PT "6 Clicks" Daily Activity  Outcome Measure Difficulty turning over in bed (including adjusting bedclothes, sheets and blankets)?: None Difficulty moving from lying on back to sitting on the side of the bed? : None Difficulty sitting down on and standing up from a chair with arms (e.g., wheelchair, bedside commode, etc,.)?: A Little Help needed moving to and from a bed to chair (including a wheelchair)?: A Little Help needed walking in hospital room?: Total Help needed climbing 3-5 steps with a railing? : Total 6 Click Score: 16    End of Session   Activity Tolerance: Patient tolerated treatment well;Patient limited by lethargy;Patient limited by pain Patient left: in chair;with call bell/phone within reach;with family/visitor present Nurse Communication: Mobility status;Patient requests pain meds PT Visit Diagnosis: Difficulty in walking, not elsewhere classified (R26.2)    Time: 9242-6834 PT Time Calculation (min) (ACUTE ONLY): 21 min   Charges:   PT Evaluation $PT Eval Low Complexity: [place], PT DPT  Board Certified Neurologic Specialist Acute Rehabilitation Services Pager [phone] Office [phone]   Duncan Dull 11/03/2018, 2:51 PM

## 2018-11-04 DIAGNOSIS — S92001A Unspecified fracture of right calcaneus, initial encounter for closed fracture: Secondary | ICD-10-CM | POA: Diagnosis not present

## 2018-11-04 NOTE — Progress Notes (Signed)
Orthopedic Trauma Service Progress Note  Patient ID: Barry Silva MRN: [id number] DOB/AGE: [birth year] 57 y.o.  Subjective:  Patient is doing marginally better today Pain is gradually improving but still requiring moderate amount of IV pain medicine  Tolerating diet Voiding without difficulty  Feels he will be ready to go home tomorrow  Review of Systems  Constitutional: Negative for chills and fever.  Respiratory: Negative for shortness of breath and wheezing.   Cardiovascular: Negative for chest pain and palpitations.  Gastrointestinal: Negative for abdominal pain, nausea and vomiting.    Objective:   VITALS:   Vitals:   11/03/18 1916 11/04/18 0424 11/04/18 0837 11/04/18 1446  BP: 129/87 (!) 148/94 (!) 136/95 124/79  Pulse: 77 88 98 88  Resp:   16 17  Temp: 98.7 F (37.1 C) 99.3 F (37.4 C) 99.1 F (37.3 C) 98.6 F (37 C)  TempSrc: Oral Oral Oral Oral  SpO2: 97% 96% 94% 95%  Weight:      Height:        Estimated body mass index is 25.03 kg/m as calculated from the following:   Height as of this encounter: 5\' 7"  (1.702 m).   Weight as of this encounter: 72.5 kg.   Intake/Output      11/16 0701 - 11/17 0700 11/17 0701 - 11/18 0700   P.O. 360    I.V. (mL/kg)     IV Piggyback     Total Intake(mL/kg) 360 (5)    Urine (mL/kg/hr) 1225 (0.7) 1960 (3.1)   Blood     Total Output 1225 1960   Net -865 -1960          LABS  No results found for this or any previous visit (from the past 24 hour(s)).   PHYSICAL EXAM:   Gen: resting comfortably in chair very pleasant, wife at bedside  Lungs: CTA B  Cardiac: RRR  Abd: + BS, NTND  Ext:       B Lower Extremities             B SLS are fitting well             Swelling stable B and appears improved              EHL, FHL, leser to motor intact B             DPN, SPN, TN sensation at baseline B              exts are warm              +  DP pulses             No pain with passive stretching    Assessment/Plan: 2 Days Post-Op   Principal Problem:   Closed left pilon fracture, initial encounter Active Problems:   OBSTRUCTIVE SLEEP APNEA   Hypertension   Closed displaced fracture of right calcaneus   Anti-infectives (From admission, onward)   Start     Dose/Rate Route Frequency Ordered Stop   11/02/18 2300  ceFAZolin (ANCEF) IVPB 2g/100 mL premix     2 g 200 mL/hr over 30 Minutes Intravenous Every 8 hours 11/02/18 2144 11/03/18 1346   11/02/18 1315  vancomycin (VANCOCIN) powder  Status:  Discontinued       As needed  11/02/18 1316 11/02/18 1544   11/02/18 1227  vancomycin (VANCOCIN) powder  Status:  Discontinued       As needed 11/02/18 1230 11/02/18 1544   11/02/18 0915  ceFAZolin (ANCEF) IVPB 2g/100 mL premix     2 g 200 mL/hr over 30 Minutes Intravenous On call to O.R. 11/02/18 0729 11/02/18 1327   11/02/18 0720  ceFAZolin (ANCEF) 2-4 GM/100ML-% IVPB    Note to Pharmacy:  Starleen Arms   : cabinet override      11/02/18 0720 11/02/18 0921    .  POD/HD#: 2  57 y/o male s/p fall from height about 2 weeks ago with complex L pilon fracture and complex R calcaneus fracture    -L pilon fracture s/p ORIF, R calcaneus fracture s/p ORIF             NWB B LEx              Bed to chair transfers only             Continue with splint until follow up appointment              Ice and elevate legs as much as possible, elevate above heart level              PT/OT    Okay to shower as long as he keeps splint dry  Okay to transfer to the regular toilet with assistance                 - Pain management:             Made adjustments to schedule  yesterday with some improvement today.   Patient's MME is roughly 192.5   I will plan to discharge home on Percocet 10/325 1-2 p.o. every 4 hours as needed for pain.   He will continue with Robaxin and gabapentin as well    We did have a long discussion about pain  management.  I would anticipate him weaning off his pain medicine quite quickly.  I do feel that he does need quite a bit of opiate to address his acute pain control.               - ABL anemia/Hemodynamics             Stable   - Medical issues              No chronic issues   - DVT/PE prophylaxis:             Lovenox  - ID:              periop abx completed   - Activity:             NWB B LEx             Bed to chair transfers only    - FEN/GI prophylaxis/Foley/Lines:             Reg diet    - Impediments to fracture healing:             High energy trauma              Severe soft tissue injury    - Dispo:             PT/OT             pain control  Hopeful for dc home tomorrow    Given the magnitude of patient's injuries and subsequent surgery I am not surprised with his progress thus far.  It is what I anticipated.  I think he is actually doing quite well given his injuries.  Being discharged on postoperative day #3 is completely reasonable and expected.   Jari Pigg, PA-C [phone] (C) 11/04/2018, 3:51 PM  [facility] [place] [address] [phone] (F)

## 2018-11-04 NOTE — Plan of Care (Signed)
  Problem: Nutrition: Goal: Adequate nutrition will be maintained Outcome: Progressing   Problem: Elimination: Goal: Will not experience complications related to bowel motility Outcome: Progressing   Problem: Pain Managment: Goal: General experience of comfort will improve Outcome: Progressing   Problem: Safety: Goal: Ability to remain free from injury will improve Outcome: Progressing   

## 2018-11-05 DIAGNOSIS — S92001A Unspecified fracture of right calcaneus, initial encounter for closed fracture: Secondary | ICD-10-CM | POA: Diagnosis not present

## 2018-11-05 MED ORDER — GABAPENTIN 100 MG PO CAPS: 200.0000 mg | ORAL_CAPSULE | Freq: Three times a day (TID) | ORAL | 0 refills | Status: DC

## 2018-11-05 MED ORDER — ENOXAPARIN SODIUM 40 MG/0.4ML ~~LOC~~ SOLN: 40.0000 mg | SUBCUTANEOUS | 0 refills | Status: DC

## 2018-11-05 MED ORDER — ONDANSETRON HCL 4 MG/2ML IJ SOLN: 4.0000 mg | Freq: Four times a day (QID) | INTRAMUSCULAR | 0 refills | Status: DC | PRN

## 2018-11-05 MED ORDER — KETOROLAC TROMETHAMINE 10 MG PO TABS: 10.0000 mg | ORAL_TABLET | Freq: Four times a day (QID) | ORAL | 0 refills | Status: DC | PRN

## 2018-11-05 MED ORDER — ONDANSETRON HCL 4 MG PO TABS: 4.0000 mg | ORAL_TABLET | Freq: Four times a day (QID) | ORAL | 0 refills | Status: DC | PRN

## 2018-11-05 MED ORDER — OXYCODONE-ACETAMINOPHEN 10-325 MG PO TABS: 1.0000 | ORAL_TABLET | Freq: Four times a day (QID) | ORAL | 0 refills | Status: DC | PRN

## 2018-11-05 MED ORDER — METHOCARBAMOL 750 MG PO TABS: 750.0000 mg | ORAL_TABLET | Freq: Four times a day (QID) | ORAL | 0 refills | Status: DC

## 2018-11-05 NOTE — Plan of Care (Signed)

## 2018-11-05 NOTE — Care Management (Signed)
Patient's Home Health services were arranged by previous Case Manager. [facility] will follow patient at discharge.

## 2018-11-05 NOTE — Progress Notes (Signed)
Orthopedic Trauma Service Progress Note  Patient ID: LENN VOLKER MRN: [id number] DOB/AGE: [birth year] 57 y.o.  Subjective:  Feeling better today than he has previously Had some issues with pain control last night requiring IV Dilaudid  No acute issues this morning Felt great after shower and using the regular toilet yesterday  No other concerns of note  Feel that he is stable to discharge home today   Review of Systems  Constitutional: Negative for chills and fever.  Respiratory: Negative for shortness of breath and wheezing.   Cardiovascular: Negative for chest pain and palpitations.  Gastrointestinal: Negative for nausea and vomiting.  Neurological: Negative for tingling.    Objective:   VITALS:   Vitals:   11/04/18 1446 11/04/18 1929 11/05/18 0349 11/05/18 0835  BP: 124/79 127/82 137/81 129/89  Pulse: 88 87 93 85  Resp: 17   17  Temp: 98.6 F (37 C) 98.4 F (36.9 C) 99.7 F (37.6 C) 98.7 F (37.1 C)  TempSrc: Oral Oral Oral Oral  SpO2: 95% 96% 94% 96%  Weight:      Height:        Estimated body mass index is 25.03 kg/m as calculated from the following:   Height as of this encounter: 5\' 7"  (1.702 m).   Weight as of this encounter: 72.5 kg.   Intake/Output      11/17 0701 - 11/18 0700 11/18 0701 - 11/19 0700   P.O.     Total Intake(mL/kg)     Urine (mL/kg/hr) 2460 (1.4) 620 (3.4)   Total Output 2460 620   Net -2460 -620          LABS  No results found for this or any previous visit (from the past 24 hour(s)).   PHYSICAL EXAM:   Gen: resting comfortably in bed very pleasant Lungs: unlabored Cardiac: regular Ext:       B Lower Extremities             B SLS are fitting well             Swelling stable B and appears improved              EHL, FHL, leser to motor intact B             DPN, SPN, TN sensation at baseline B              exts are warm              + DP  pulses             No pain with passive stretching      Assessment/Plan: 3 Days Post-Op   Principal Problem:   Closed left pilon fracture, initial encounter Active Problems:   OBSTRUCTIVE SLEEP APNEA   Hypertension   Closed displaced fracture of right calcaneus   Anti-infectives (From admission, onward)   Start     Dose/Rate Route Frequency Ordered Stop   11/02/18 2300  ceFAZolin (ANCEF) IVPB 2g/100 mL premix     2 g 200 mL/hr over 30 Minutes Intravenous Every 8 hours 11/02/18 2144 11/03/18 1346   11/02/18 1315  vancomycin (VANCOCIN) powder  Status:  Discontinued       As needed 11/02/18 1316 11/02/18 1544   11/02/18 1227  vancomycin (VANCOCIN)  powder  Status:  Discontinued       As needed 11/02/18 1230 11/02/18 1544   11/02/18 0915  ceFAZolin (ANCEF) IVPB 2g/100 mL premix     2 g 200 mL/hr over 30 Minutes Intravenous On call to O.R. 11/02/18 0729 11/02/18 1327   11/02/18 0720  ceFAZolin (ANCEF) 2-4 GM/100ML-% IVPB    Note to Pharmacy:  Starleen Arms   : cabinet override      11/02/18 0720 11/02/18 0921    .  POD/HD#: 57  57 y/o male s/p fall from height about 2 weeks ago with complex L pilon fracture and complex R calcaneus fracture    -L pilon fracture s/p ORIF, R calcaneus fracture s/p ORIF             NWB B LEx              Bed to chair transfers only             Continue with splint until follow up appointment              Ice and elevate legs as much as possible, elevate above heart level              PT/OT                Okay to shower as long as he keeps splint dry             Okay to transfer to the regular toilet with assistance     - Pain management:             percocet 10/325 1-2 PO q4-6h PRN pain   Robaxin 750 mg po q6h   toradol 10 mg po q6h x 5 days   Gabapentin 200 mg po q8h    Ice and elevation    - ABL anemia/Hemodynamics             Stable   - Medical issues              No chronic issues   - DVT/PE prophylaxis:             Lovenox  x 30 days  - ID:              periop abx completed   - Activity:             NWB B LEx             Bed to chair transfers only    - FEN/GI prophylaxis/Foley/Lines:             Reg diet    - Impediments to fracture healing:             High energy trauma              Severe soft tissue injury    - Dispo:             PT/OT             pain control              dc home today   Follow up in 10-14 days     Jari Pigg, PA-C [phone] (C) 11/05/2018, 9:33 AM  [facility] [place] [address] [phone] Domingo Sep (F)

## 2018-11-05 NOTE — Discharge Summary (Signed)
Orthopaedic Trauma Service (OTS) Discharge Summary   Patient ID: Barry Silva MRN: [id number] DOB/AGE: [birth year] 57 y.o.  Admit date: 11/02/2018 Discharge date: 11/05/2018  Admission Diagnoses: Closed left tibial plafond fracture Closed right calcaneus fracture History of fall from height approximately 2 weeks ago Obstructive sleep apnea Hypertension  Discharge Diagnoses:  Principal Problem:   Closed left pilon fracture, initial encounter Active Problems:   OBSTRUCTIVE SLEEP APNEA   Hypertension   Closed displaced fracture of right calcaneus   Past Medical History:  Diagnosis Date  . ADD (attention deficit disorder with hyperactivity)    formal evaluation  . Complication of anesthesia    so cold - was worse than pain from injury- 10/26/18- this was not the an issue  . 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 History of kidney stones   . Hypertension   . Migraine   . MIGRAINE HEADACHE 08/30/2007   Qualifier: Diagnosis of  By: Scherrie Gerlach    . Nephrolithiasis    hx of episode 1/10 calcium stones  . OSA (obstructive sleep apnea)    mild, Apnealink 7 2011 RDI7, O2 nadir 83%  . Refusal of blood transfusions as patient is Jehovah's Witness   . Sarcoidosis    bronchoscopy 2006, mtx started 04/19/10 try off 11/29/10 (atypical cp/ ? worsening ct chest [facility]) off prednisone 7/11     Procedures Performed: 11/02/2018-Dr. Haddix 1. CPT 06237-SEGB reduction internal fixation of left pilon fracture 2. CPT 20694-Removal of external fixator 3. CPT 28415-Open reduction of right calcaneus fracture  Discharged Condition: good  Hospital Course:  Patient is a very pleasant 57 year old male who sustained a fall off a indoor rock climbing wall on 10/26/2018.  Patient sustained significant injuries to both of his lower extremities including left tibial pilon fracture and a highly comminuted and impacted right calcaneus fracture.  Due to severe soft tissue  swelling definitive surgery cannot be performed on his initial presentation.  Patient was placed into an external fixator for his left ankle as well as a bulky splint on his right leg to allow for resolution of the soft tissue swelling.  Patient was readmitted on 11/02/2018 for definitive period procedures on both of his lower extremities.  In this perioperative period he was covered with Lovenox for DVT and PE prophylaxis as he is nonweightbearing bilaterally and bed to chair transfers only.       Patient was taken to the operating room on 11/02/2018 for the procedures noted above.  After surgery he was admitted to the orthopedic unit for observation, pain control and therapies.      Patient first admission he was actually discharged on postoperative day #1.  We did have some thought that possibly he could discharge on postoperative day #1 however given the magnitude of his surgery as well as his injuries we did not think that this was possible.  This did come to fruition as patient did have fairly significant pain requiring extensive amount of opiate medications including IV opiates to get adequate pain control.  His hospital course was really uneventful.  It went as anticipated particularly due to his pain control.  Over the course of 3 days his need for IV pain medication.  Adjustments were made throughout his hospital stay to optimize his pain control.  Ultimately on postoperative day #3 patient had adequate pain control to be discharged to home.  At the time of discharge patient was safely transferring from bed to  chair, voiding without difficulty and passing flatus.  Patient is also tolerating regular diet discharge.      Patient will be discharged on Lovenox for 30 days for DVT prophylaxis again as he will remain nonweightbearing bilaterally for the next 8 weeks.  He is bed to chair transfers only.      We will work on a rapid wean in terms of his opiate pain medications.  Multimodal analgesia has  been employed to optimize pain control which includes Percocet, Robaxin, gabapentin, ketorolac, cryotherapy mobilization.         Consults: None  Significant Diagnostic Studies: No significant diagnostic studies  Treatments: IV hydration, antibiotics: Ancef, analgesia: Tylenol, oxycodone, Toradol, Robaxin, Dilaudid, anticoagulation: LMW heparin, therapies: PT, OT and RN and surgery: As above  Discharge Exam:    Orthopedic Trauma Service Progress Note   Patient ID: Barry Silva MRN: [id number] DOB/AGE: 1961/03/10 57 y.o.   Subjective:   Feeling better today than he has previously Had some issues with pain control last night requiring IV Dilaudid   No acute issues this morning Felt great after shower and using the regular toilet yesterday   No other concerns of note   Feel that he is stable to discharge home today     Review of Systems  Constitutional: Negative for chills and fever.  Respiratory: Negative for shortness of breath and wheezing.   Cardiovascular: Negative for chest pain and palpitations.  Gastrointestinal: Negative for nausea and vomiting.  Neurological: Negative for tingling.      Objective:    VITALS:         Vitals:    11/04/18 1446 11/04/18 1929 11/05/18 0349 11/05/18 0835  BP: 124/79 127/82 137/81 129/89  Pulse: 88 87 93 85  Resp: 17     17  Temp: 98.6 F (37 C) 98.4 F (36.9 C) 99.7 F (37.6 C) 98.7 F (37.1 C)  TempSrc: Oral Oral Oral Oral  SpO2: 95% 96% 94% 96%  Weight:          Height:              Estimated body mass index is 25.03 kg/m as calculated from the following:   Height as of this encounter: 5\' 7"  (1.702 m).   Weight as of this encounter: 72.5 kg.     Intake/Output      11/17 0701 - 11/18 0700 11/18 0701 - 11/19 0700   P.O.     Total Intake(mL/kg)     Urine (mL/kg/hr) 2460 (1.4) 620 (3.4)   Total Output 2460 620   Net -2460 -620           LABS   Lab Results Last 24 Hours  No results found for this or any  previous visit (from the past 24 hour(s)).       PHYSICAL EXAM:    Gen: resting comfortably in bed very pleasant Lungs: unlabored Cardiac: regular Ext:       B Lower Extremities             B SLS are fitting well             Swelling stable B and appears improved              EHL, FHL, leser to motor intact B             DPN, SPN, TN sensation at baseline B              exts are  warm              + DP pulses             No pain with passive stretching        Assessment/Plan: 3 Days Post-Op    Principal Problem:   Closed left pilon fracture, initial encounter Active Problems:   OBSTRUCTIVE SLEEP APNEA   Hypertension   Closed displaced fracture of right calcaneus                Anti-infectives (From admission, onward)    Start     Dose/Rate Route Frequency Ordered Stop    11/02/18 2300   ceFAZolin (ANCEF) IVPB 2g/100 mL premix     2 g 200 mL/hr over 30 Minutes Intravenous Every 8 hours 11/02/18 2144 11/03/18 1346    11/02/18 1315   vancomycin (VANCOCIN) powder  Status:  Discontinued         As needed 11/02/18 1316 11/02/18 1544    11/02/18 1227   vancomycin (VANCOCIN) powder  Status:  Discontinued         As needed 11/02/18 1230 11/02/18 1544    11/02/18 0915   ceFAZolin (ANCEF) IVPB 2g/100 mL premix     2 g 200 mL/hr over 30 Minutes Intravenous On call to O.R. 11/02/18 0729 11/02/18 1327    11/02/18 0720   ceFAZolin (ANCEF) 2-4 GM/100ML-% IVPB    Note to Pharmacy:  Starleen Arms   : cabinet override         11/02/18 0720 11/02/18 0921     .   POD/HD#: 54   57 y/o male s/p fall from height about 2 weeks ago with complex L pilon fracture and complex R calcaneus fracture    -L pilon fracture s/p ORIF, R calcaneus fracture s/p ORIF             NWB B LEx              Bed to chair transfers only             Continue with splint until follow up appointment              Ice and elevate legs as much as possible, elevate above heart level              PT/OT                 Okay to shower as long as he keeps splint dry             Okay to transfer to the regular toilet with assistance     - Pain management:             percocet 10/325 1-2 PO q4-6h PRN pain              Robaxin 750 mg po q6h              toradol 10 mg po q6h x 5 days              Gabapentin 200 mg po q8h                Ice and elevation    - Medical issues              No chronic issues   - DVT/PE prophylaxis:             Lovenox x 30 days   - Activity:  NWB B LEx             Bed to chair transfers only    - FEN/GI prophylaxis/Foley/Lines:             Reg diet    - Impediments to fracture healing:             High energy trauma              Severe soft tissue injury    - Dispo:             PT/OT             pain control              dc home today              Follow up in 10-14 days    Disposition: Discharge disposition: 01-Home or Self Care       Discharge Instructions    Call MD / Call 911   Complete by:  As directed    If you experience chest pain or shortness of breath, CALL 911 and be transported to the hospital emergency room.  If you develope a fever above 101 F, pus (white drainage) or increased drainage or redness at the wound, or calf pain, call your surgeon's office.   Constipation Prevention   Complete by:  As directed    Drink plenty of fluids.  Prune juice may be helpful.  You may use a stool softener, such as Colace (over the counter) 100 mg twice a day.  Use MiraLax (over the counter) for constipation as needed.   Diet general   Complete by:  As directed    Discharge instructions   Complete by:  As directed    General Discharge Instructions  WEIGHT BEARING STATUS:  Nonweightbearing B lower extremities   RANGE OF MOTION/ACTIVITY: unrestricted range of motion of B hips and knees. Ok to move toes as much as possible as well. Do not remove dressings/splints   Bed to chair transfers only until instructed otherwise   Wound Care: do  not remove dressings/splints. Keep splints clean and dry. We will remove splints at first post op visit   DVT/PE prophylaxis: Lovenox 40 mg injection daily x 30 days   Diet: as you were eating previously.  Can use over the counter stool softeners and bowel preparations, such as Miralax, to help with bowel movements.  Narcotics can be constipating.  Be sure to drink plenty of fluids  PAIN MEDICATION USE AND EXPECTATIONS  You have likely been given narcotic medications to help control your pain.  After a traumatic event that results in an fracture (broken bone) with or without surgery, it is ok to use narcotic pain medications to help control one's pain.  We understand that everyone responds to pain differently and each individual patient will be evaluated on a regular basis for the continued need for narcotic medications. Ideally, narcotic medication use should last no more than 6-8 weeks (coinciding with fracture healing).   As a patient it is your responsibility as well to monitor narcotic medication use and report the amount and frequency you use these medications when you come to your office visit.   We would also advise that if you are using narcotic medications, you should take a dose prior to therapy to maximize you participation.  IF YOU ARE ON NARCOTIC MEDICATIONS IT IS NOT PERMISSIBLE TO OPERATE A MOTOR VEHICLE (MOTORCYCLE/CAR/TRUCK/MOPED) OR HEAVY MACHINERY DO  NOT MIX NARCOTICS WITH OTHER CNS ([place]) DEPRESSANTS SUCH AS ALCOHOL   STOP SMOKING OR USING NICOTINE PRODUCTS!!!!  As discussed nicotine severely impairs your body's ability to heal surgical and traumatic wounds but also impairs bone healing.  Wounds and bone heal by forming microscopic blood vessels (angiogenesis) and nicotine is a vasoconstrictor (essentially, shrinks blood vessels).  Therefore, if vasoconstriction occurs to these microscopic blood vessels they essentially disappear and are unable to deliver  necessary nutrients to the healing tissue.  This is one modifiable factor that you can do to dramatically increase your chances of healing your injury.    (This means no smoking, no nicotine gum, patches, etc)  DO NOT USE NONSTEROIDAL ANTI-INFLAMMATORY DRUGS (NSAID'S)  Using products such as Advil (ibuprofen), Aleve (naproxen), Motrin (ibuprofen) for additional pain control during fracture healing can delay and/or prevent the healing response.  If you would like to take over the counter (OTC) medication, Tylenol (acetaminophen) is ok.  However, some narcotic medications that are given for pain control contain acetaminophen as well. Therefore, you should not exceed more than 4000 mg of tylenol in a day if you do not have liver disease.  Also note that there are may OTC medicines, such as cold medicines and allergy medicines that my contain tylenol as well.  If you have any questions about medications and/or interactions please ask your doctor/PA or your pharmacist.      ICE AND ELEVATE INJURED/OPERATIVE EXTREMITY  Using ice and elevating the injured extremity above your heart can help with swelling and pain control.  Icing in a pulsatile fashion, such as 20 minutes on and 20 minutes off, can be followed.    Do not place ice directly on skin. Make sure there is a barrier between to skin and the ice pack.    Using frozen items such as frozen peas works well as the conform nicely to the are that needs to be iced.  USE AN ACE WRAP OR TED HOSE FOR SWELLING CONTROL  In addition to icing and elevation, Ace wraps or TED hose are used to help limit and resolve swelling.  It is recommended to use Ace wraps or TED hose until you are informed to stop.    When using Ace Wraps start the wrapping distally (farthest away from the body) and wrap proximally (closer to the body)   Example: If you had surgery on your leg or thing and you do not have a splint on, start the ace wrap at the toes and work your way up to the  thigh        If you had surgery on your upper extremity and do not have a splint on, start the ace wrap at your fingers and work your way up to the upper arm  IF YOU ARE IN A SPLINT OR CAST DO NOT [place]   If your splint gets wet for any reason please contact the office immediately. You may shower in your splint or cast as long as you keep it dry.  This can be done by wrapping in a cast cover or garbage back (or similar)  Do Not stick any thing down your splint or cast such as pencils, money, or hangers to try and scratch yourself with.  If you feel itchy take benadryl as prescribed on the bottle for itching  IF YOU ARE IN A CAM BOOT (BLACK BOOT)  You may remove boot periodically. Perform daily dressing changes as noted  below.  Wash the liner of the boot regularly and wear a sock when wearing the boot. It is recommended that you sleep in the boot until told otherwise  CALL THE OFFICE WITH ANY QUESTIONS OR CONCERNS: [phone]   Driving restrictions   Complete by:  As directed    No driving   Increase activity slowly as tolerated   Complete by:  As directed    Non weight bearing   Complete by:  As directed    Laterality:  bilateral   Extremity:  Lower     Allergies as of 11/05/2018      Reactions   Morphine And Related Nausea Only      Medication List    STOP taking these medications   acetaminophen 500 MG tablet Commonly known as:  TYLENOL   oxyCODONE 5 MG immediate release tablet Commonly known as:  Oxy IR/ROXICODONE     TAKE these medications   amLODipine 5 MG tablet Commonly known as:  NORVASC Take 1 tablet (5 mg total) by mouth daily.   amphetamine-dextroamphetamine 25 MG 24 hr capsule Commonly known as:  ADDERALL XR Take 1 capsule by mouth every morning. OV needed for  further refills   enoxaparin 40 MG/0.4ML injection Commonly known as:  LOVENOX Inject 0.4 mLs (40 mg total) into the skin daily for 30 doses.   gabapentin 100 MG  capsule Commonly known as:  NEURONTIN Take 2 capsules (200 mg total) by mouth 3 (three) times daily for 14 days.   ketorolac 10 MG tablet Commonly known as:  TORADOL Take 1 tablet (10 mg total) by mouth every 6 (six) hours as needed for moderate pain.   losartan 100 MG tablet Commonly known as:  COZAAR Take 1 tablet (100 mg total) by mouth daily. DUE FOR ANNUAL VISIT What changed:  additional instructions   methocarbamol 750 MG tablet Commonly known as:  ROBAXIN Take 1 tablet (750 mg total) by mouth 4 (four) times daily. What changed:    medication strength  how much to take  when to take this  reasons to take this   ondansetron 4 MG tablet Commonly known as:  ZOFRAN Take 1 tablet (4 mg total) by mouth every 6 (six) hours as needed for nausea.   oxyCODONE-acetaminophen 10-325 MG tablet Commonly known as:  PERCOCET Take 1-2 tablets by mouth every 6 (six) hours as needed for pain.   vitamin C 500 MG tablet Commonly known as:  ASCORBIC ACID Take 500 mg by mouth daily.            Discharge Care Instructions  (From admission, onward)         Start     Ordered   11/05/18 0000  Non weight bearing    Question Answer Comment  Laterality bilateral   Extremity Lower      11/05/18 0953         Follow-up Information    Haddix, Thomasene Lot, MD. Schedule an appointment as soon as possible for a visit in 10 day(s).   Specialty:  Orthopedic Surgery Contact information: [place] [address] [facility].   Specialty:  [place] Why:  A representative from [facility] will contact youy to arrange start date and time for your therapy. Contact information: [address] [place]  [address] [phone]           Discharge Instructions and Plan:  57 y/o male s/p fall from height about 2 weeks ago with complex L pilon fracture and complex R calcaneus fracture  -L  pilon fracture s/p ORIF, R calcaneus fracture s/p ORIF NWB B LEx  Bed to chair transfers only Continue with splint until follow up appointment  Ice and elevate legs as much as possible, elevate above heart level  PT/OT  Okay to shower as long as he keepssplint dry Okay to transfer to the regular toilet with assistance   - Pain management: percocet 10/325 1-2 PO q4-6h PRN pain              Robaxin 750 mg po q6h              toradol 10 mg po q6h x 5 days              Gabapentin 200 mg po q8h               Ice and elevation   - ABL anemia/Hemodynamics Stable  - Medical issues No chronic issues  - DVT/PE prophylaxis: Lovenoxx 30 days  - ID: periop abxcompleted  - Activity: NWB B LEx Bed to chair transfers only   - FEN/GI prophylaxis/Foley/Lines: Reg diet  - Impediments to fracture healing: High energy trauma  Severe soft tissue injury   - Dispo: PT/OT pain control dc home today              Follow up in 10-14 days    Signed:  Jari Pigg, PA-C [phone] (C) 11/07/2018, 10:07 AM  [facility] [place] [address] [phone] Domingo Sep (F)

## 2018-11-05 NOTE — Progress Notes (Signed)
Provided discharge education/instructions, all questions and concerns addressed, Pt not in distress, discharged home with belongings accompanied by wife. 

## 2018-11-05 NOTE — Anesthesia Postprocedure Evaluation (Signed)
Anesthesia Post Note  Patient: Barry Silva  Procedure(s) Performed: OPEN REDUCTION INTERNAL FIXATION (ORIF) LEFT PILON FRACTURE (Left Leg Lower) OPEN REDUCTION INTERNAL FIXATION (ORIF) CALCANEOUS FRACTURE (Right Foot) REMOVAL EXTERNAL FIXATION LEG (Leg Lower)     Patient location during evaluation: PACU Anesthesia Type: General Level of consciousness: awake and alert Pain management: pain level controlled Vital Signs Assessment: post-procedure vital signs reviewed and stable Respiratory status: spontaneous breathing, nonlabored ventilation and respiratory function stable Cardiovascular status: blood pressure returned to baseline and stable Postop Assessment: no apparent nausea or vomiting Anesthetic complications: no    Last Vitals:  Vitals:   11/05/18 0349 11/05/18 0835  BP: 137/81 129/89  Pulse: 93 85  Resp:  17  Temp: 37.6 C 37.1 C  SpO2: 94% 96%    Last Pain:  Vitals:   11/05/18 0835  TempSrc: Oral  PainSc:                  Audry Pili

## 2018-11-05 NOTE — Progress Notes (Signed)
During the course of the night patient requested pain medication. RN offered patient Oxycodone for pain management as patient had received during the previous shift for pain management. Also patient is pending to discharge home today therefore pain pills is the recommended route.   Patient states " I hurt worse at night and need Dilaudid." Patient refused to take any additional Oxycodone stating that Dilaudid works best for him. Patient states " I don't think I can go home with my pain out of control as it has been." However, patient states that " This has been the best night for me regarding pain control." Nursing will continue to monitor.

## 2018-11-05 NOTE — Discharge Instructions (Signed)
[facility] Discharge Instructions   General Discharge Instructions  WEIGHT BEARING STATUS:  Nonweightbearing B lower extremities   RANGE OF MOTION/ACTIVITY: unrestricted range of motion of B hips and knees. Ok to move toes as much as possible as well. Do not remove dressings/splints   Bed to chair transfers only until instructed otherwise   Wound Care: do not remove dressings/splints. Keep splints clean and dry. We will remove splints at first post op visit   DVT/PE prophylaxis: Lovenox 40 mg injection daily x 30 days   Diet: as you were eating previously.  Can use over the counter stool softeners and bowel preparations, such as Miralax, to help with bowel movements.  Narcotics can be constipating.  Be sure to drink plenty of fluids  PAIN MEDICATION USE AND EXPECTATIONS  You have likely been given narcotic medications to help control your pain.  After a traumatic event that results in an fracture (broken bone) with or without surgery, it is ok to use narcotic pain medications to help control one's pain.  We understand that everyone responds to pain differently and each individual patient will be evaluated on a regular basis for the continued need for narcotic medications. Ideally, narcotic medication use should last no more than 6-8 weeks (coinciding with fracture healing).   As a patient it is your responsibility as well to monitor narcotic medication use and report the amount and frequency you use these medications when you come to your office visit.   We would also advise that if you are using narcotic medications, you should take a dose prior to therapy to maximize you participation.  IF YOU ARE ON NARCOTIC MEDICATIONS IT IS NOT PERMISSIBLE TO OPERATE A MOTOR VEHICLE (MOTORCYCLE/CAR/TRUCK/MOPED) OR HEAVY MACHINERY DO NOT MIX NARCOTICS WITH OTHER CNS (CENTRAL NERVOUS SYSTEM) DEPRESSANTS SUCH AS ALCOHOL   STOP SMOKING OR USING NICOTINE PRODUCTS!!!!  As discussed  nicotine severely impairs your body's ability to heal surgical and traumatic wounds but also impairs bone healing.  Wounds and bone heal by forming microscopic blood vessels (angiogenesis) and nicotine is a vasoconstrictor (essentially, shrinks blood vessels).  Therefore, if vasoconstriction occurs to these microscopic blood vessels they essentially disappear and are unable to deliver necessary nutrients to the healing tissue.  This is one modifiable factor that you can do to dramatically increase your chances of healing your injury.    (This means no smoking, no nicotine gum, patches, etc)  DO NOT USE NONSTEROIDAL ANTI-INFLAMMATORY DRUGS (NSAID'S)  Using products such as Advil (ibuprofen), Aleve (naproxen), Motrin (ibuprofen) for additional pain control during fracture healing can delay and/or prevent the healing response.  If you would like to take over the counter (OTC) medication, Tylenol (acetaminophen) is ok.  However, some narcotic medications that are given for pain control contain acetaminophen as well. Therefore, you should not exceed more than 4000 mg of tylenol in a day if you do not have liver disease.  Also note that there are may OTC medicines, such as cold medicines and allergy medicines that my contain tylenol as well.  If you have any questions about medications and/or interactions please ask your doctor/PA or your pharmacist.      ICE AND ELEVATE INJURED/OPERATIVE EXTREMITY  Using ice and elevating the injured extremity above your heart can help with swelling and pain control.  Icing in a pulsatile fashion, such as 20 minutes on and 20 minutes off, can be followed.    Do not place ice directly on skin. Make sure  there is a barrier between to skin and the ice pack.    Using frozen items such as frozen peas works well as the conform nicely to the are that needs to be iced.  USE AN ACE WRAP OR TED HOSE FOR SWELLING CONTROL  In addition to icing and elevation, Ace wraps or TED hose are  used to help limit and resolve swelling.  It is recommended to use Ace wraps or TED hose until you are informed to stop.    When using Ace Wraps start the wrapping distally (farthest away from the body) and wrap proximally (closer to the body)   Example: If you had surgery on your leg or thing and you do not have a splint on, start the ace wrap at the toes and work your way up to the thigh        If you had surgery on your upper extremity and do not have a splint on, start the ace wrap at your fingers and work your way up to the upper arm  IF YOU ARE IN A SPLINT OR CAST DO NOT [place]   If your splint gets wet for any reason please contact the office immediately. You may shower in your splint or cast as long as you keep it dry.  This can be done by wrapping in a cast cover or garbage back (or similar)  Do Not stick any thing down your splint or cast such as pencils, money, or hangers to try and scratch yourself with.  If you feel itchy take benadryl as prescribed on the bottle for itching  IF YOU ARE IN A CAM BOOT (BLACK BOOT)  You may remove boot periodically. Perform daily dressing changes as noted below.  Wash the liner of the boot regularly and wear a sock when wearing the boot. It is recommended that you sleep in the boot until told otherwise  CALL THE OFFICE WITH ANY QUESTIONS OR CONCERNS: [phone]

## 2018-11-20 ENCOUNTER — Other Ambulatory Visit: Payer: Self-pay | Admitting: Internal Medicine

## 2018-11-20 NOTE — Telephone Encounter (Signed)
Copied from [place] [phone]. Topic: Quick Communication - Rx Refill/Question >> Nov 20, 2018 10:40 AM Robina Ade, Helene Kelp D wrote: Medication: amphetamine-dextroamphetamine (ADDERALL XR) 25 MG 24 hr capsule  Has the patient contacted their pharmacy? No its control med. (Agent: If no, request that the patient contact the pharmacy for the refill.) (Agent: If yes, when and what did the pharmacy advise?)  Preferred Pharmacy (with phone number or street name): [id number] [place], [place]: Please be advised that RX refills may take up to 3 business days. We ask that you follow-up with your pharmacy.

## 2018-11-20 NOTE — Telephone Encounter (Signed)
Message routed to PCP CMA for refills. 

## 2018-11-21 NOTE — Telephone Encounter (Signed)
Last refilled 09/05/18 #30 x 0 refills Was Due for Annual Visit October/November 2019  Please advise Dr Regis Bill, thanks.

## 2018-11-23 MED ORDER — AMPHETAMINE-DEXTROAMPHET ER 25 MG PO CP24: 25.0000 mg | ORAL_CAPSULE | ORAL | 0 refills | Status: DC

## 2018-11-23 NOTE — Telephone Encounter (Signed)
Refill x 1  He had major injury  Ask him when he can come in  ? Jan ?

## 2018-11-27 ENCOUNTER — Other Ambulatory Visit: Payer: Self-pay | Admitting: Internal Medicine

## 2018-12-24 ENCOUNTER — Other Ambulatory Visit: Payer: Self-pay | Admitting: Internal Medicine

## 2018-12-24 NOTE — Telephone Encounter (Signed)
Copied from [place] [id number]. Topic: Quick Communication - Rx Refill/Question >> Dec 24, 2018 10:23 AM Percell Belt A wrote: Medication:  amphetamine-dextroamphetamine (ADDERALL XR) 25 MG 24 hr capsule [[id number]] Has the patient contacted their pharmacy? No  (Agent: If no, request that the patient contact the pharmacy for the refill.) (Agent: If yes, when and what did the pharmacy advise?)  Preferred Pharmacy (with phone number or street name): [id number] [address], [place] [phone] (Phone)   Agent: Please be advised that RX refills may take up to 3 business days. We ask that you follow-up with your pharmacy.

## 2018-12-25 NOTE — Telephone Encounter (Signed)
Last filled :11/23/18 Last OV:01/22/18 Please advise

## 2018-12-25 NOTE — Addendum Note (Signed)
Addended by: Dimple Nanas on: 12/25/2018 09:43 AM   Modules accepted: Orders

## 2018-12-28 MED ORDER — AMPHETAMINE-DEXTROAMPHET ER 25 MG PO CP24: 25.0000 mg | ORAL_CAPSULE | ORAL | 0 refills | Status: DC

## 2018-12-28 NOTE — Telephone Encounter (Signed)
Make appt for FU    I will refill today

## 2018-12-28 NOTE — Addendum Note (Signed)
Addended byBurnis Medin on: 12/28/2018 08:33 AM   Modules accepted: Orders

## 2019-01-30 ENCOUNTER — Telehealth: Payer: Self-pay | Admitting: Internal Medicine

## 2019-01-30 MED ORDER — AMPHETAMINE-DEXTROAMPHET ER 20 MG PO CP24: 20.0000 mg | ORAL_CAPSULE | Freq: Every day | ORAL | 0 refills | Status: DC

## 2019-01-30 NOTE — Addendum Note (Signed)
Addended byShanon Ace K on: 01/30/2019 03:08 PM   Modules accepted: Orders

## 2019-01-30 NOTE — Telephone Encounter (Signed)
Talked to pt and he said would like to try the 20mg 

## 2019-01-30 NOTE — Telephone Encounter (Signed)
Medication not delegated for NT to refill. 

## 2019-01-30 NOTE — Telephone Encounter (Signed)
Copied from [place] [phone]. Topic: Quick Communication - Rx Refill/Question >> Jan 30, 2019 10:26 AM Alanda Slim E wrote: Medication: amphetamine-dextroamphetamine (ADDERALL XR) 25 MG 24 hr capsule  Has the patient contacted their pharmacy? No    Preferred Pharmacy (with phone number or street name): [id number] [address], [place] [phone] (Phone) [phone] (Fax)    Agent: Please be advised that RX refills may take up to 3 business days. We ask that you follow-up with your pharmacy.

## 2019-01-30 NOTE — Telephone Encounter (Signed)
Sent in electronically .  20 mg XR

## 2019-01-30 NOTE — Telephone Encounter (Signed)
At our last visit we talked about trying to decrease the Adderall XR to 20 mg a day Please contact patient and ask if he wishes to remain at the 25 mgxr or try reducing to 20 mg xra day.

## 2019-02-25 ENCOUNTER — Encounter: Payer: Self-pay | Admitting: *Deleted

## 2019-03-15 ENCOUNTER — Other Ambulatory Visit: Payer: Self-pay | Admitting: Internal Medicine

## 2019-03-15 NOTE — Progress Notes (Signed)
Virtual Visit via Video Note  I connected with@ on 03/18/19 at 10:45 AM EDT by a video enabled telemedicine application and verified that I am speaking with the correct person using two identifiers.  Location patient: home Location provider:workoffice Persons participating in the virtual visit: patient, provider  limitations of evaluation and management by telemedicine and the lack of  availability of in person appointments. 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 No cp sob  resp sx   Past Medical History:  Diagnosis Date  . ADD (attention deficit disorder with hyperactivity)    formal evaluation  . Complication of anesthesia    so cold - was worse than pain from injury- 10/26/18- this was not the an issue  . 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 History of kidney stones   . Hypertension   . Migraine   . MIGRAINE HEADACHE 08/30/2007   Qualifier: Diagnosis of  By: Scherrie Gerlach    . Nephrolithiasis    hx of episode 1/10 calcium stones  . OSA (obstructive sleep apnea)    mild, Apnealink 7 2011 RDI7, O2 nadir 83%  . Refusal of blood transfusions as patient is Jehovah's Witness   . Sarcoidosis    bronchoscopy 2006, mtx started 04/19/10 try off 11/29/10 (atypical cp/ ? worsening ct chest [facility]) off prednisone 7/11    Past Surgical History:  Procedure Laterality Date  . BRONCHOSCOPY  2006  . COLONOSCOPY    . EXTERNAL  FIXATION LEG Bilateral 10/26/2018   Procedure: LEFT LEGEXTERNAL FIXATION .Marland KitchenSPLINT CHANGE RIGHT LEG;  Surgeon: Shona Needles, MD;  Location: [facility];  Service: Orthopedics;  Laterality: Bilateral;  . EXTERNAL FIXATION REMOVAL  11/02/2018   Procedure: REMOVAL EXTERNAL FIXATION LEG;  Surgeon: Shona Needles, MD;  Location: [facility];  Service: Orthopedics;;  . HERNIA REPAIR     age 15  . KIDNEY STONE SURGERY     cystocopy  . [place]   getting knee to bend- had a previous fracture  . OPEN REDUCTION INTERNAL FIXATION (ORIF) TIBIA/FIBULA FRACTURE Left 11/02/2018   Procedure: OPEN REDUCTION INTERNAL FIXATION (ORIF) LEFT PILON FRACTURE;  Surgeon: Shona Needles, MD;  Location: [facility];  Service: Orthopedics;  Laterality: Left;  . ORIF CALCANEOUS FRACTURE Right 11/02/2018   Procedure: OPEN REDUCTION INTERNAL FIXATION (ORIF) CALCANEOUS FRACTURE;  Surgeon: Shona Needles, MD;  Location: [facility];  Service: Orthopedics;  Laterality: Right;    Family History  Problem Relation Age of Onset  . Stroke Other        Grandfather father older age  . Colon cancer Neg Hx   . Esophageal cancer Neg Hx   . Rectal cancer Neg Hx   . Stomach  cancer Neg Hx     SOCIAL HX: no tobacco at home with wife     Current Outpatient Medications:  .  amLODipine (NORVASC) 5 MG tablet, TAKE 1 TABLET BY MOUTH EVERY DAY, Disp: 30 tablet, Rfl: 0 .  amphetamine-dextroamphetamine (ADDERALL XR) 20 MG 24 hr capsule, Take 1 capsule (20 mg total) by mouth daily., Disp: 30 capsule, Rfl: 0 .  vitamin C (ASCORBIC ACID) 500 MG tablet, Take 500 mg by mouth daily., Disp: , Rfl:  .  losartan (COZAAR) 100 MG tablet, TAKE 1 TABLET (100 MG TOTAL) BY MOUTH DAILY. DUE FOR ANNUAL VISIT, Disp: 30 tablet, Rfl: 1  EXAM:  VITALS per patient if applicable: checkef at visit 143.88  And 143/90   No weight   GENERAL: alert, oriented, appears well and in no acute distress  HEENT: atraumatic, conjunttiva clear, no obvious abnormalities  on inspection of external nose and ears  NECK: normal movements of the head and neck  LUNGS: on inspection no signs of respiratory distress, breathing rate appears normal, no obvious gross SOB, gasping or wheezing  CV: no obvious cyanosis PSYCH/NEURO: pleasant and cooperative, no obvious depression or anxiety, speech and thought processing grossly intact Lab Results  Component Value Date   WBC 13.5 (H) 10/25/2018   HGB 15.2 10/25/2018   HCT 44.7 10/25/2018   PLT 259 10/25/2018   GLUCOSE 92 10/25/2018   CHOL 217 (H) 10/16/2017   TRIG 145.0 10/16/2017   HDL 52.60 10/16/2017   LDLDIRECT 142.6 05/14/2013   LDLCALC 135 (H) 10/16/2017   ALT 25 10/25/2018   AST 33 10/25/2018   NA 140 10/25/2018   K 3.8 10/25/2018   CL 105 10/25/2018   CREATININE 0.98 10/25/2018   BUN 18 10/25/2018   CO2 27 10/25/2018   TSH 1.53 08/16/2016   PSA 1.11 08/16/2016   INR 0.96 11/02/2018   HGBA1C 5.1 10/27/2017   The 10-year ASCVD risk score Mikey Bussing DC Jr., et al., 2013) is: 5.9%   Values used to calculate the score:     Age: 58 years     Sex: Male     Is Non-Hispanic African American: No     Diabetic: No     Tobacco smoker: No     Systolic Blood Pressure: 655 mmHg     Is BP treated: Yes     HDL Cholesterol: 52.6 mg/dL     Total Cholesterol: 217 mg/dL  ASSESSMENT AND PLAN:  Discussed the following assessment and plan:  Essential hypertension  Medication management  Attention deficit hyperactivity disorder (ADHD), other type  Hyperlipidemia, unspecified hyperlipidemia type - consdier recheck  at next visit 6 mos  ( today web x visit )   Personal history of kidney stones   Benefit more than risk of medications  to continue. adherence for bp med could be better   To  Optimize bp control  Take med every day for the next w2 weeks then send in readings   Bid for 3 days  If better  And no action then 5-6 months  Ov or PV    I discussed the assessment and treatment plan with the patient. The  patient was provided an opportunity to ask questions and all were answered. The patient agreed with the plan and demonstrated an understanding of the instructions.   The patient was advised to call back or seek an in-person evaluation if concern  I provided 21 minutes of non-face-to-face time during this encounter.   Shanon Ace, MD

## 2019-03-15 NOTE — Telephone Encounter (Signed)
Pt states he does not want to come in at this time

## 2019-03-15 NOTE — Telephone Encounter (Signed)
Then he needs to keep his web x visit

## 2019-03-16 ENCOUNTER — Other Ambulatory Visit: Payer: Self-pay | Admitting: Internal Medicine

## 2019-03-18 ENCOUNTER — Ambulatory Visit (INDEPENDENT_AMBULATORY_CARE_PROVIDER_SITE_OTHER): Payer: 59 | Admitting: Internal Medicine

## 2019-03-18 ENCOUNTER — Encounter: Payer: Self-pay | Admitting: Internal Medicine

## 2019-03-18 ENCOUNTER — Other Ambulatory Visit: Payer: Self-pay

## 2019-03-18 DIAGNOSIS — E785 Hyperlipidemia, unspecified: Secondary | ICD-10-CM | POA: Diagnosis not present

## 2019-03-18 DIAGNOSIS — I10 Essential (primary) hypertension: Secondary | ICD-10-CM

## 2019-03-18 DIAGNOSIS — Z87442 Personal history of urinary calculi: Secondary | ICD-10-CM

## 2019-03-18 DIAGNOSIS — Z79899 Other long term (current) drug therapy: Secondary | ICD-10-CM

## 2019-03-18 DIAGNOSIS — F908 Attention-deficit hyperactivity disorder, other type: Secondary | ICD-10-CM

## 2019-03-18 MED ORDER — AMPHETAMINE-DEXTROAMPHET ER 20 MG PO CP24: 20.0000 mg | ORAL_CAPSULE | Freq: Every day | ORAL | 0 refills | Status: DC

## 2019-04-02 ENCOUNTER — Telehealth: Payer: Self-pay | Admitting: Pulmonary Disease

## 2019-04-02 NOTE — Telephone Encounter (Signed)
Pt last seen by Dr. Halford Chessman 11/29/2010 for sarcoidosis.  Called and spoke with pt. Pt is wanting to know if he is at increased risk for COVID-19 due to his diagnosis of sarcoidosis. Dr. Halford Chessman, please advise on this for pt. Thanks!

## 2019-04-02 NOTE — Telephone Encounter (Signed)
Yes

## 2019-04-03 NOTE — Telephone Encounter (Signed)
ATC, went to voicemail. LMTCB. 

## 2019-04-04 NOTE — Telephone Encounter (Signed)
Called and informed pt of VS' response. Pt verbalized understanding with no further questions. States his sarcoidosis has not been giving him problems and would not need an OV as of now. 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 Let him know should he have any new or worsening symptoms in regards to his sarcoidosis to give [place] a call. Pt expressed understanding. Nothing further needed at this time.

## 2019-04-04 NOTE — Telephone Encounter (Signed)
Patient is returning phone call.  Patient phone number is [phone].

## 2019-04-23 ENCOUNTER — Other Ambulatory Visit: Payer: Self-pay | Admitting: *Deleted

## 2019-04-23 MED ORDER — AMPHETAMINE-DEXTROAMPHET ER 20 MG PO CP24: 20.0000 mg | ORAL_CAPSULE | Freq: Every day | ORAL | 0 refills | Status: DC

## 2019-04-23 NOTE — Telephone Encounter (Signed)
Sent in electronically .  

## 2019-04-23 NOTE — Telephone Encounter (Signed)
System  Is rejecting  Controlled sub  E rx  Will try again later

## 2019-04-23 NOTE — Telephone Encounter (Signed)
Copied from [place] [phone]. Topic: Quick Communication - Rx Refill/Question >> Apr 23, 2019 11:13 AM Virl Axe D wrote: Medication: amphetamine-dextroamphetamine (ADDERALL XR) 20 MG 24 hr capsule  Has the patient contacted their pharmacy? No.   (Agent: If no, request that the patient contact the pharmacy for the refill.) (Agent: If yes, when and what did the pharmacy advise?)  Preferred Pharmacy (with phone number or street name): [id number] [address], [place] [phone] (Phone) [phone] (Fax)    Agent: Please be advised that RX refills may take up to 3 business days. We ask that you follow-up with your pharmacy.

## 2019-05-06 ENCOUNTER — Encounter: Payer: Self-pay | Admitting: Internal Medicine

## 2019-05-08 ENCOUNTER — Other Ambulatory Visit: Payer: Self-pay | Admitting: Internal Medicine

## 2019-05-23 ENCOUNTER — Telehealth: Payer: Self-pay | Admitting: Internal Medicine

## 2019-05-23 MED ORDER — AMPHETAMINE-DEXTROAMPHET ER 20 MG PO CP24: 20.0000 mg | ORAL_CAPSULE | Freq: Every day | ORAL | 0 refills | Status: DC

## 2019-05-23 NOTE — Telephone Encounter (Signed)
Copied from [place] [id number]. Topic: Quick Communication - Rx Refill/Question >> May 23, 2019 11:21 AM Margot Ables wrote: Medication: amphetamine-dextroamphetamine (ADDERALL XR) 20 MG 24 hr capsule - 2 pills left  Has the patient contacted their pharmacy? No - controlled Preferred Pharmacy (with phone number or street name): [id number] [address], [place] [phone] (Phone) [phone] (Fax)

## 2019-05-23 NOTE — Telephone Encounter (Signed)
Done for 30 days  

## 2019-05-28 ENCOUNTER — Encounter (HOSPITAL_BASED_OUTPATIENT_CLINIC_OR_DEPARTMENT_OTHER): Payer: Self-pay | Admitting: *Deleted

## 2019-05-28 ENCOUNTER — Other Ambulatory Visit: Payer: Self-pay

## 2019-05-31 ENCOUNTER — Other Ambulatory Visit (HOSPITAL_COMMUNITY)
Admission: RE | Admit: 2019-05-31 | Discharge: 2019-05-31 | Disposition: A | Discharge status: Home / Self Care | Payer: 59 | Source: Ambulatory Visit | Attending: Orthopaedic Surgery | Admitting: Orthopaedic Surgery

## 2019-05-31 DIAGNOSIS — Z01812 Encounter for preprocedural laboratory examination: Secondary | ICD-10-CM | POA: Diagnosis not present

## 2019-05-31 DIAGNOSIS — Z1159 Encounter for screening for other viral diseases: Secondary | ICD-10-CM | POA: Diagnosis not present

## 2019-06-01 LAB — NOVEL CORONAVIRUS, NAA (HOSP ORDER, SEND-OUT TO REF LAB; TAT 18-24 HRS): SARS-CoV-2, NAA: NOT DETECTED

## 2019-06-03 NOTE — Anesthesia Preprocedure Evaluation (Addendum)
Anesthesia Evaluation  Patient identified by MRN, date of birth, ID band Patient awake    Reviewed: Allergy & Precautions, NPO status , Patient's Chart, lab work & pertinent test results  Airway Mallampati: II  TM Distance: >3 FB Neck ROM: Full    Dental no notable dental hx. (+) Teeth Intact   Pulmonary sleep apnea ,    Pulmonary exam normal breath sounds clear to auscultation       Cardiovascular hypertension, Pt. on medications Normal cardiovascular exam Rhythm:Regular Rate:Normal     Neuro/Psych  Headaches, PSYCHIATRIC DISORDERS ADD   GI/Hepatic   Endo/Other  negative endocrine ROS  Renal/GU Renal disease     Musculoskeletal negative musculoskeletal ROS (+)   Abdominal   Peds  Hematology  (+) [facility]'S WITNESS  Anesthesia Other Findings   Reproductive/Obstetrics                            Anesthesia Physical Anesthesia Plan  ASA: II  Anesthesia Plan: Regional and General   Post-op Pain Management:  Regional for Post-op pain   Induction: Intravenous  PONV Risk Score and Plan: Treatment may vary due to age or medical condition and Ondansetron  Airway Management Planned: LMA  Additional Equipment:   Intra-op Plan:   Post-operative Plan:   Informed Consent: I have reviewed the patients History and Physical, chart, labs and discussed the procedure including the risks, benefits and alternatives for the proposed anesthesia with the patient or authorized representative who has indicated his/her understanding and acceptance.     Dental advisory given  Plan Discussed with: CRNA  Anesthesia Plan Comments: (R adductor canal block w LMA)      Anesthesia Quick Evaluation

## 2019-06-04 ENCOUNTER — Ambulatory Visit (HOSPITAL_BASED_OUTPATIENT_CLINIC_OR_DEPARTMENT_OTHER): Payer: 59 | Admitting: Anesthesiology

## 2019-06-04 ENCOUNTER — Ambulatory Visit (HOSPITAL_COMMUNITY): Payer: 59

## 2019-06-04 ENCOUNTER — Encounter (HOSPITAL_BASED_OUTPATIENT_CLINIC_OR_DEPARTMENT_OTHER)
Admission: RE | Disposition: A | Discharge status: Home / Self Care | Payer: Self-pay | Source: Home / Self Care | Attending: Orthopaedic Surgery

## 2019-06-04 ENCOUNTER — Other Ambulatory Visit: Payer: Self-pay

## 2019-06-04 ENCOUNTER — Ambulatory Visit (HOSPITAL_BASED_OUTPATIENT_CLINIC_OR_DEPARTMENT_OTHER)
Admission: RE | Admit: 2019-06-04 | Discharge: 2019-06-04 | Disposition: A | Discharge status: Home / Self Care | Payer: 59 | Attending: Orthopaedic Surgery | Admitting: Orthopaedic Surgery

## 2019-06-04 ENCOUNTER — Encounter (HOSPITAL_BASED_OUTPATIENT_CLINIC_OR_DEPARTMENT_OTHER): Payer: Self-pay

## 2019-06-04 DIAGNOSIS — M65872 Other synovitis and tenosynovitis, left ankle and foot: Secondary | ICD-10-CM | POA: Insufficient documentation

## 2019-06-04 DIAGNOSIS — M7672 Peroneal tendinitis, left leg: Secondary | ICD-10-CM | POA: Diagnosis not present

## 2019-06-04 DIAGNOSIS — Z885 Allergy status to narcotic agent status: Secondary | ICD-10-CM | POA: Insufficient documentation

## 2019-06-04 DIAGNOSIS — D869 Sarcoidosis, unspecified: Secondary | ICD-10-CM | POA: Insufficient documentation

## 2019-06-04 DIAGNOSIS — M899 Disorder of bone, unspecified: Secondary | ICD-10-CM | POA: Insufficient documentation

## 2019-06-04 DIAGNOSIS — S9302XA Subluxation of left ankle joint, initial encounter: Secondary | ICD-10-CM | POA: Diagnosis not present

## 2019-06-04 DIAGNOSIS — X58XXXA Exposure to other specified factors, initial encounter: Secondary | ICD-10-CM | POA: Insufficient documentation

## 2019-06-04 DIAGNOSIS — I10 Essential (primary) hypertension: Secondary | ICD-10-CM | POA: Diagnosis not present

## 2019-06-04 DIAGNOSIS — F988 Other specified behavioral and emotional disorders with onset usually occurring in childhood and adolescence: Secondary | ICD-10-CM | POA: Insufficient documentation

## 2019-06-04 DIAGNOSIS — G43909 Migraine, unspecified, not intractable, without status migrainosus: Secondary | ICD-10-CM | POA: Diagnosis not present

## 2019-06-04 DIAGNOSIS — Z87442 Personal history of urinary calculi: Secondary | ICD-10-CM | POA: Insufficient documentation

## 2019-06-04 DIAGNOSIS — Z4789 Encounter for other orthopedic aftercare: Secondary | ICD-10-CM

## 2019-06-04 DIAGNOSIS — G4733 Obstructive sleep apnea (adult) (pediatric): Secondary | ICD-10-CM | POA: Diagnosis not present

## 2019-06-04 DIAGNOSIS — Z79899 Other long term (current) drug therapy: Secondary | ICD-10-CM | POA: Insufficient documentation

## 2019-06-04 DIAGNOSIS — Z9889 Other specified postprocedural states: Secondary | ICD-10-CM | POA: Insufficient documentation

## 2019-06-04 DIAGNOSIS — Z823 Family history of stroke: Secondary | ICD-10-CM | POA: Diagnosis not present

## 2019-06-04 HISTORY — PX: HARDWARE REMOVAL: SHX979

## 2019-06-04 HISTORY — PX: TENDON EXPLORATION: SHX5112

## 2019-06-04 SURGERY — REMOVAL, HARDWARE
Anesthesia: Monitor Anesthesia Care | Site: Foot | Laterality: Right

## 2019-06-04 MED ORDER — GLYCOPYRROLATE PF 0.2 MG/ML IJ SOSY
PREFILLED_SYRINGE | INTRAMUSCULAR | Status: AC
  Filled 2019-06-04: qty 1

## 2019-06-04 MED ORDER — PROPOFOL 500 MG/50ML IV EMUL
INTRAVENOUS | Status: DC | PRN
  Administered 2019-06-04: 15 ug/kg/min via INTRAVENOUS

## 2019-06-04 MED ORDER — CEFAZOLIN SODIUM-DEXTROSE 2-4 GM/100ML-% IV SOLN
INTRAVENOUS | Status: AC
  Filled 2019-06-04: qty 100

## 2019-06-04 MED ORDER — CHLORHEXIDINE GLUCONATE 4 % EX LIQD: 60.0000 mL | Freq: Once | CUTANEOUS | Status: DC

## 2019-06-04 MED ORDER — OXYCODONE HCL 5 MG PO TABS: 5.0000 mg | ORAL_TABLET | ORAL | 0 refills | Status: AC | PRN

## 2019-06-04 MED ORDER — LIDOCAINE 2% (20 MG/ML) 5 ML SYRINGE
INTRAMUSCULAR | Status: AC
  Filled 2019-06-04: qty 5

## 2019-06-04 MED ORDER — ROPIVACAINE HCL 5 MG/ML IJ SOLN
INTRAMUSCULAR | Status: DC | PRN
  Administered 2019-06-04: 30 mg via PERINEURAL

## 2019-06-04 MED ORDER — ENSURE PRE-SURGERY PO LIQD: 296.0000 mL | Freq: Once | ORAL | Status: DC

## 2019-06-04 MED ORDER — SCOPOLAMINE 1 MG/3DAYS TD PT72: 1.0000 | MEDICATED_PATCH | Freq: Once | TRANSDERMAL | Status: DC | PRN

## 2019-06-04 MED ORDER — FENTANYL CITRATE (PF) 100 MCG/2ML IJ SOLN
INTRAMUSCULAR | Status: AC
  Filled 2019-06-04: qty 2

## 2019-06-04 MED ORDER — PROPOFOL 10 MG/ML IV BOLUS
INTRAVENOUS | Status: DC | PRN
  Administered 2019-06-04: 180 mg via INTRAVENOUS

## 2019-06-04 MED ORDER — POVIDONE-IODINE 10 % EX SWAB: 2.0000 "application " | Freq: Once | CUTANEOUS | Status: DC

## 2019-06-04 MED ORDER — DEXAMETHASONE SODIUM PHOSPHATE 10 MG/ML IJ SOLN
INTRAMUSCULAR | Status: AC
  Filled 2019-06-04: qty 1

## 2019-06-04 MED ORDER — CLONIDINE HCL (ANALGESIA) 100 MCG/ML EP SOLN
EPIDURAL | Status: DC | PRN
  Administered 2019-06-04: 100 ug

## 2019-06-04 MED ORDER — DEXAMETHASONE SODIUM PHOSPHATE 10 MG/ML IJ SOLN
INTRAMUSCULAR | Status: DC | PRN
  Administered 2019-06-04: 10 mg via INTRAVENOUS

## 2019-06-04 MED ORDER — PROPOFOL 500 MG/50ML IV EMUL
INTRAVENOUS | Status: AC
  Filled 2019-06-04: qty 50

## 2019-06-04 MED ORDER — ONDANSETRON HCL 4 MG/2ML IJ SOLN
INTRAMUSCULAR | Status: AC
  Filled 2019-06-04: qty 2

## 2019-06-04 MED ORDER — MIDAZOLAM HCL 2 MG/2ML IJ SOLN
INTRAMUSCULAR | Status: AC
  Filled 2019-06-04: qty 2

## 2019-06-04 MED ORDER — ONDANSETRON HCL 4 MG/2ML IJ SOLN
INTRAMUSCULAR | Status: DC | PRN
  Administered 2019-06-04: 4 mg via INTRAVENOUS

## 2019-06-04 MED ORDER — LACTATED RINGERS IV SOLN
INTRAVENOUS | Status: DC
  Administered 2019-06-04 (×2): via INTRAVENOUS

## 2019-06-04 MED ORDER — LIDOCAINE 2% (20 MG/ML) 5 ML SYRINGE
INTRAMUSCULAR | Status: DC | PRN
  Administered 2019-06-04: 100 mg via INTRAVENOUS

## 2019-06-04 MED ORDER — MIDAZOLAM HCL 2 MG/2ML IJ SOLN
1.0000 mg | INTRAMUSCULAR | Status: DC | PRN
  Administered 2019-06-04: 2 mg via INTRAVENOUS

## 2019-06-04 MED ORDER — GLYCOPYRROLATE PF 0.2 MG/ML IJ SOSY
PREFILLED_SYRINGE | INTRAMUSCULAR | Status: DC | PRN
  Administered 2019-06-04: .2 mg via INTRAVENOUS

## 2019-06-04 MED ORDER — FENTANYL CITRATE (PF) 100 MCG/2ML IJ SOLN
50.0000 ug | INTRAMUSCULAR | Status: DC | PRN
  Administered 2019-06-04: 100 ug via INTRAVENOUS

## 2019-06-04 MED ORDER — CEFAZOLIN SODIUM-DEXTROSE 2-4 GM/100ML-% IV SOLN
2.0000 g | INTRAVENOUS | Status: AC
  Administered 2019-06-04: 2 g via INTRAVENOUS

## 2019-06-04 SURGICAL SUPPLY — 67 items
ADH SKN CLS APL DERMABOND .7 (GAUZE/BANDAGES/DRESSINGS) ×2
APL PRP STRL LF DISP 70% ISPRP (MISCELLANEOUS) ×2
APL SKNCLS STERI-STRIP NONHPOA (GAUZE/BANDAGES/DRESSINGS)
BANDAGE ACE 4X5 VEL STRL LF (GAUZE/BANDAGES/DRESSINGS) IMPLANT
BANDAGE ACE 6X5 VEL STRL LF (GAUZE/BANDAGES/DRESSINGS) ×8 IMPLANT
BANDAGE ESMARK 6X9 LF (GAUZE/BANDAGES/DRESSINGS) ×2 IMPLANT
BENZOIN TINCTURE PRP APPL 2/3 (GAUZE/BANDAGES/DRESSINGS) IMPLANT
BLADE SURG 15 STRL LF DISP TIS (BLADE) ×4 IMPLANT
BLADE SURG 15 STRL SS (BLADE) ×8
BNDG CMPR 9X4 STRL LF SNTH (GAUZE/BANDAGES/DRESSINGS)
BNDG CMPR 9X6 STRL LF SNTH (GAUZE/BANDAGES/DRESSINGS) ×2
BNDG ESMARK 4X9 LF (GAUZE/BANDAGES/DRESSINGS) IMPLANT
BNDG ESMARK 6X9 LF (GAUZE/BANDAGES/DRESSINGS) ×4
CHLORAPREP W/TINT 26 (MISCELLANEOUS) ×4 IMPLANT
CLOSURE WOUND 1/2 X4 (GAUZE/BANDAGES/DRESSINGS)
COVER BACK TABLE REUSABLE LG (DRAPES) ×4 IMPLANT
COVER WAND RF STERILE (DRAPES) IMPLANT
CUFF TOURN SGL QUICK 34 (TOURNIQUET CUFF) ×4
CUFF TRNQT CYL 34X4.125X (TOURNIQUET CUFF) ×2 IMPLANT
DECANTER SPIKE VIAL GLASS SM (MISCELLANEOUS) IMPLANT
DERMABOND ADVANCED (GAUZE/BANDAGES/DRESSINGS) ×2
DERMABOND ADVANCED .7 DNX12 (GAUZE/BANDAGES/DRESSINGS) ×2 IMPLANT
DRAPE EXTREMITY T 121X128X90 (DISPOSABLE) ×4 IMPLANT
DRAPE IMP U-DRAPE 54X76 (DRAPES) ×4 IMPLANT
DRAPE OEC MINIVIEW 54X84 (DRAPES) IMPLANT
DRAPE U-SHAPE 47X51 STRL (DRAPES) ×4 IMPLANT
ELECT REM PT RETURN 9FT ADLT (ELECTROSURGICAL) ×4
ELECTRODE REM PT RTRN 9FT ADLT (ELECTROSURGICAL) ×2 IMPLANT
GAUZE SPONGE 4X4 12PLY STRL (GAUZE/BANDAGES/DRESSINGS) ×4 IMPLANT
GAUZE XEROFORM 1X8 LF (GAUZE/BANDAGES/DRESSINGS) ×4 IMPLANT
GLOVE BIO SURGEON STRL SZ7.5 (GLOVE) ×4 IMPLANT
GLOVE BIOGEL PI IND STRL 8 (GLOVE) ×2 IMPLANT
GLOVE BIOGEL PI INDICATOR 8 (GLOVE) ×2
GOWN STRL REUS W/ TWL LRG LVL3 (GOWN DISPOSABLE) ×2 IMPLANT
GOWN STRL REUS W/ TWL XL LVL3 (GOWN DISPOSABLE) ×2 IMPLANT
GOWN STRL REUS W/TWL LRG LVL3 (GOWN DISPOSABLE) ×4
GOWN STRL REUS W/TWL XL LVL3 (GOWN DISPOSABLE) ×4
NDL HYPO 25X1 1.5 SAFETY (NEEDLE) IMPLANT
NEEDLE HYPO 25X1 1.5 SAFETY (NEEDLE) IMPLANT
NS IRRIG 1000ML POUR BTL (IV SOLUTION) ×4 IMPLANT
PACK BASIN DAY SURGERY FS (CUSTOM PROCEDURE TRAY) ×4 IMPLANT
PAD CAST 4YDX4 CTTN HI CHSV (CAST SUPPLIES) ×2 IMPLANT
PADDING CAST COTTON 4X4 STRL (CAST SUPPLIES) ×4
PADDING CAST SYNTHETIC 4 (CAST SUPPLIES) ×2
PADDING CAST SYNTHETIC 4X4 STR (CAST SUPPLIES) ×2 IMPLANT
PENCIL BUTTON HOLSTER BLD 10FT (ELECTRODE) ×4 IMPLANT
SLEEVE SCD COMPRESS KNEE MED (MISCELLANEOUS) ×4 IMPLANT
SPLINT FAST PLASTER 5X30 (CAST SUPPLIES)
SPLINT PLASTER CAST FAST 5X30 (CAST SUPPLIES) IMPLANT
SPONGE LAP 18X18 RF (DISPOSABLE) IMPLANT
STOCKINETTE 6  STRL (DRAPES) ×2
STOCKINETTE 6 STRL (DRAPES) ×2 IMPLANT
STRIP CLOSURE SKIN 1/2X4 (GAUZE/BANDAGES/DRESSINGS) IMPLANT
SUCTION FRAZIER HANDLE 10FR (MISCELLANEOUS) ×2
SUCTION TUBE FRAZIER 10FR DISP (MISCELLANEOUS) ×2 IMPLANT
SUT ETHILON 3 0 PS 1 (SUTURE) ×2 IMPLANT
SUT MNCRL AB 3-0 PS2 18 (SUTURE) ×4 IMPLANT
SUT PDS AB 2-0 CT2 27 (SUTURE) ×2 IMPLANT
SUT VIC AB 3-0 FS2 27 (SUTURE) ×2 IMPLANT
SUTURE TAPE 1.3 FIBERLOP 20 ST (SUTURE) IMPLANT
SUTURETAPE 1.3 FIBERLOOP 20 ST (SUTURE)
SYR BULB 3OZ (MISCELLANEOUS) ×4 IMPLANT
SYR CONTROL 10ML LL (SYRINGE) IMPLANT
TOWEL GREEN STERILE FF (TOWEL DISPOSABLE) ×8 IMPLANT
TUBE CONNECTING 20'X1/4 (TUBING) ×1
TUBE CONNECTING 20X1/4 (TUBING) ×3 IMPLANT
UNDERPAD 30X30 (UNDERPADS AND DIAPERS) ×4 IMPLANT

## 2019-06-04 NOTE — Anesthesia Procedure Notes (Addendum)
Procedure Name: LMA Insertion Date/Time: 06/04/2019 9:02 AM Performed by: Lyndee Leo, CRNA Pre-anesthesia Checklist: Patient identified, Emergency Drugs available, Suction available and Patient being monitored Patient Re-evaluated:Patient Re-evaluated prior to induction Oxygen Delivery Method: Circle system utilized Preoxygenation: Pre-oxygenation with 100% oxygen Induction Type: IV induction Ventilation: Mask ventilation without difficulty LMA: LMA inserted LMA Size: 4.0 Number of attempts: 1 Airway Equipment and Method: Bite block Placement Confirmation: positive ETCO2 Tube secured with: Tape Dental Injury: Teeth and Oropharynx as per pre-operative assessment

## 2019-06-04 NOTE — Op Note (Addendum)
Barry Silva male 58 y.o. 06/04/2019  PreOperative Diagnosis: Symptomatic orthopedic hardware Right calcaneus Right peroneal tendinosis Right peroneal tendon subluxation  PostOperative Diagnosis: Symptomatic orthopedic hardware Right calcaneus Right peroneal tendinosis Right peroneal tendon subluxation Right peroneal tendon adhesions Right peroneus brevis low-lying muscle belly Right calcaneal exostosis  PROCEDURE: Removal of hardware Right calcaneus, deep Tenolysis of Right peroneus brevis and peroneus longus Excisional debridement of low-lying muscle belly of peroneus brevis Repair of superior peroneal retinaculum Calcaneal exostectomy  SURGEON: Melony Overly, MD  ASSISTANT: None  ANESTHESIA: General LMA with peripheral nerve block  FINDINGS: See postoperative diagnosis  IMPLANTS: None  INDICATIONS:58 y.o. male sustained a calcaneus fracture in November 2019 that ultimately underwent ORIF by Dr. Lennette Bihari Haddix.  He healed the fracture uneventfully but developed pain along his peroneal tendons with MRI evidence of tendinosis and exam consistent with thickening along the peroneal tendons and pain.  He also had plate fixation to the lateral aspect of his calcaneus that could be the cause of the peroneal tendinosis and discomfort.  Patient failed conservative treatment the form of boot immobilization, activity modification, physical therapy and other treatment modalities and felt that he had maximized his recovery and wished to proceed with hardware removal and exploration and repair versus reconstruction of his peroneal tendons.  After weighing the risk, benefits alternatives he wished to proceed.  The risk discussed included but were not limited to wound healing complications, infection, need for further surgery, demonstrating structures, continued pain, weakness.  He also understood the perioperative and anesthetic risk which include death.  After weighing these risks he  opted to proceed with surgery.  PROCEDURE: Patient was identified in the preoperative holding area.  The right leg was marked by myself.  The consent was signed myself and the patient.  Anesthesia performed a peripheral nerve block.  He was taken to the operative suite and placed supine the operative table.  General LMA anesthesia was induced without difficulty.  Preoperative antibiotics were given.  A thigh tourniquet was placed on the the right thigh.  The right lower extremity was prepped and draped in usual sterile fashion.  A bump was placed under the right hip prior to draping.  All bony prominences were well-padded.  Surgical timeout was performed.  We began by elevating the leg and inflating the tourniquet to 250 mmHg.  Then a longitudinal incision was created at the site of prior calcaneal ORIF.  This is a sinus Tarsi type incision.  This taken sharply down through skin and subcutaneous tissue.  There is a robust amount of scarring beneath the incision and blunt dissection was used to identify the peroneus tendon sheath.  A small nick was made in the sheath.  And then scissor dissection was performed to identify and open up the sheath distal to the fibula identifying the peroneus longus and brevis tendons.  There was adhesions between the peroneus longus and brevis tendons at this level.  Then a Soil scientist was placed up the tendon sheath within the superior peroneal retinaculum area.  The superior peroneal retinaculum was tested and found to be lax.  Then the incision was extended in the retro-fibular area sharply down through skin and subcutaneous tissue.  Then the superior peroneal retinaculum was opened as was the more proximal tendon sheath.  The tendons were mobilized and there was a large amount of adhesions between the peroneus longus and brevis.  There was a notable peroneus brevis low-lying muscle belly.  Using scissor sharply and 15 blade  tenolysis was performed of the peroneus longus and  brevis tendons.  This was to free up the adhesions distally and proximally along the tendons.  The low-lying muscle belly was excised sharply with 15 blade and scissor dissection performing the excisional debridement of the low-lying muscle belly.  Then the tendons were inspected and found to be intact without evidence of tearing.  There is a small nodule distal to the fibula of the peroneus brevis at the site of the calcaneal plate that appeared to be rubbing on the tendon in this area.  There is also a large calcaneal exostosis in this area.  Dissection was performed along the lateral wall of the calcaneus sharply with a 15 blade as well as with a periosteal elevator identifying the underlying plate fixation.  Then the screws for the plate were removed and the plate was removed.  Fluoroscopy was taken after plate removal and identified 2 remaining screws within the calcaneus that were well-seated and buried within the bone.  We did not attempt to remove these.  Then the large calcaneal exostosis on the lateral aspect of the calcaneus was removed with a rondure and the calcaneus on the lateral wall was smoothed.  There was some exposed cancellus bone at the site of exostosis and therefore bone wax was placed in this area.  Then the peroneal tendons were further inspected and found to have no tearing within the tendons distally or proximally along the length of the tendon.  This was for the peroneus brevis and longus tendons both.  Then the superior peroneal retinacular tissue was reconstructed after reducing the tendons.  There was adequate retro-fibular groove.  The superior peroneal retinaculum was repaired using a 0 FiberWire stitch in an interrupted fashion.  A Valora Corporal was placed along the tendons during the repair of the superior peroneal retinaculum to ensure it was not over tightened.  After repair the tendons were able to glide freely through the groove.  Then the remaining peroneus brevis and longus tendon  sheaths were repaired with a 2-0 PDS suture.  Then the remaining deep tissue was closed using a 2-0 PDS.  The tourniquet was released and hemostasis was obtained.  This was through direct compression and Bovie cautery.  The subcuticular tissue was closed with 3-0 Monocryl.  The skin was closed with a 3-0 nylon.  He was placed in a soft dressing of Xeroform, 4 x 4's and sterile she cotton.  He was placed in a nonweightbearing short leg splint.  All counts were correct at the end of the case.  There were no complications.  He tolerated this well.  Patient was awakened from anesthesia without difficulty.  He was taken to the recovery room.  POST OPERATIVE INSTRUCTIONS: Nonweightbearing to right lower extremity Keep splint dry Keep limb elevated Call the office with concerns Follow-up in 2 weeks for splint removal, wound check and suture removal.  No x-rays needed on visit. No need for DVT prophylaxis in this otherwise healthy patient  BLOOD LOSS:  Minimal         DRAINS: none         SPECIMEN: none       COMPLICATIONS:  * No complications entered in OR log *         Disposition: PACU - hemodynamically stable.         Condition: stable

## 2019-06-04 NOTE — Anesthesia Postprocedure Evaluation (Signed)
Anesthesia Post Note  Patient: Barry Silva  Procedure(s) Performed: RIGHT FOOT HARDWARE REMOVAL (Right ) EXPLORATION AND REPAIR  PERONEAL TENDONS (Right Foot)     Patient location during evaluation: PACU Anesthesia Type: Regional and General Level of consciousness: awake and alert Pain management: pain level controlled Vital Signs Assessment: post-procedure vital signs reviewed and stable Respiratory status: spontaneous breathing, nonlabored ventilation, respiratory function stable and patient connected to nasal cannula oxygen Cardiovascular status: blood pressure returned to baseline and stable Postop Assessment: no apparent nausea or vomiting Anesthetic complications: no    Last Vitals:  Vitals:   06/04/19 1115 06/04/19 1212  BP: 108/78 111/84  Pulse: 69 74  Resp: 12 18  Temp:  36.6 C  SpO2: 97% 97%    Last Pain:  Vitals:   06/04/19 1212  TempSrc:   PainSc: 0-No pain                 Barnet Glasgow

## 2019-06-04 NOTE — Discharge Instructions (Signed)
DR. Lucia Gaskins FOOT & ANKLE SURGERY POST-OP INSTRUCTIONS   Pain Management 1. The numbing medicine and your leg will last around 8 hours, take a dose of your pain medicine as soon as you feel it wearing off to avoid rebound pain. 2. Keep your foot elevated above heart level.  Make sure that your heel hangs free ('floats'). 3. Take all prescribed medication as directed. 4. If taking narcotic pain medication you may want to use an over-the-counter stool softener to avoid constipation. 5. You may take over-the-counter NSAIDs (ibuprofen, naproxen, etc.) as well as over-the-counter acetaminophen as directed on the packaging as a supplement for your pain and may also use it to wean away from the prescription medication.  Activity ? Non-weightbearing ? Postoperatively, you will be placed into a splint which stays on for 2 weeks and then will be changed at your first postop visit.  First Postoperative Visit 1. Your first postop visit will be at least 2 weeks after surgery.  This should be scheduled when you schedule surgery. 2. If you do not have a postoperative visit scheduled please call [phone] to schedule an appointment. 3. At the appointment your incision will be evaluated for suture removal, x-rays will be obtained if necessary.  General Instructions 1. Swelling is very common after foot and ankle surgery.  It often takes 3 months for the foot and ankle to begin to feel comfortable.  Some amount of swelling will persist for 6-12 months. 2. DO NOT change the dressing.  If there is a problem with the dressing (too tight, loose, gets wet, etc.) please contact Dr. Pollie Friar office. 3. DO NOT get the dressing wet.  For showers you can use an over-the-counter cast cover or wrap a washcloth around the top of your dressing and then cover it with a plastic bag and tape it to your leg. 4. DO NOT soak the incision (no tubs, pools, bath, etc.) until you have approval from Dr. Lucia Gaskins.  Contact Dr. Huel Cote  office or go to Emergency Room if: 1. Temperature above 101 F. 2. Increasing pain that is unresponsive to pain medication or elevation 3. Excessive redness or swelling in your foot 4. Dressing problems - excessive bloody drainage, looseness or tightness, or if dressing gets wet 5. Develop pain, swelling, warmth, or discoloration of your calf     Regional Anesthesia Blocks  1. Numbness or the inability to move the "blocked" extremity may last from 3-48 hours after placement. The length of time depends on the medication injected and your individual response to the medication. If the numbness is not going away after 48 hours, call your surgeon.  2. The extremity that is blocked will need to be protected until the numbness is gone and the  Strength has returned. Because you cannot feel it, you will need to take extra care to avoid injury. Because it may be weak, you may have difficulty moving it or using it. You may not know what position it is in without looking at it while the block is in effect.  3. For blocks in the legs and feet, returning to weight bearing and walking needs to be done carefully. You will need to wait until the numbness is entirely gone and the strength has returned. You should be able to move your leg and foot normally before you try and bear weight or walk. You will need someone to be with you when you first try to ensure you do not fall and possibly risk injury.  4. Bruising and tenderness at the needle site are common side effects and will resolve in a few days.  5. Persistent numbness or new problems with movement should be communicated to the surgeon or the [facility] [phone] [place] [phone]).       Post Anesthesia Home Care Instructions  Activity: Get plenty of rest for the remainder of the day. A responsible individual must stay with you for 24 hours following the procedure.  For the next 24 hours, DO NOT: -Drive a  car -Paediatric nurse -Drink alcoholic beverages -Take any medication unless instructed by your physician -Make any legal decisions or sign important papers.  Meals: Start with liquid foods such as gelatin or soup. Progress to regular foods as tolerated. Avoid greasy, spicy, heavy foods. If nausea and/or vomiting occur, drink only clear liquids until the nausea and/or vomiting subsides. Call your physician if vomiting continues.  Special Instructions/Symptoms: Your throat may feel dry or sore from the anesthesia or the breathing tube placed in your throat during surgery. If this causes discomfort, gargle with warm salt water. The discomfort should disappear within 24 hours.  If you had a scopolamine patch placed behind your ear for the management of post- operative nausea and/or vomiting:  1. The medication in the patch is effective for 72 hours, after which it should be removed.  Wrap patch in a tissue and discard in the trash. Wash hands thoroughly with soap and water. 2. You may remove the patch earlier than 72 hours if you experience unpleasant side effects which may include dry mouth, dizziness or visual disturbances. 3. Avoid touching the patch. Wash your hands with soap and water after contact with the patch.

## 2019-06-04 NOTE — Progress Notes (Signed)
Assisted Dr. Houser with right, ultrasound guided, popliteal block. Side rails up, monitors on throughout procedure. See vital signs in flow sheet. Tolerated Procedure well. 

## 2019-06-04 NOTE — H&P (Signed)
Barry Silva is an 58 y.o. male.   Chief Complaint: Status post ORIF of right calcaneus fracture on 11/03/2019 by Dr. Lennette Bihari Haddix with peroneal tendon pain and symptomatic orthopedic hardware HPI: Patient is here today for surgical intervention for removal of hardware and exploration and repair versus reconstruction of his peroneal tendons.  The likely require superior peroneal retinaculum reconstruction and fibular groove deepening if needed.  Patient continues have pain in the posterior lateral ankle.  It is impeding his continued rehabilitation from his injury.  He recall he did sustain a left peel on ankle fracture that was fixed by Dr. Doreatha Martin as well.  He is done well with that.  Patient denies any recent fevers or chills.  He wishes to proceed with surgery.  Past Medical History:  Diagnosis Date  . ADD (attention deficit disorder with hyperactivity)    formal evaluation  . Complication of anesthesia    so cold - was worse than pain from injury- 10/26/18- this was not the an issue  . 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 History of kidney stones   . Hypertension   . Migraine   . MIGRAINE HEADACHE 08/30/2007   Qualifier: Diagnosis of  By: Scherrie Gerlach    . Nephrolithiasis    hx of episode 1/10 calcium stones  . OSA (obstructive sleep apnea)    mild, Apnealink 7 2011 RDI7, O2 nadir 83%  . Refusal of blood transfusions as patient is Jehovah's Witness   . Sarcoidosis    bronchoscopy 2006, mtx started 04/19/10 try off 11/29/10 (atypical cp/ ? worsening ct chest [facility]) off prednisone 7/11    Past Surgical History:  Procedure Laterality Date  . BRONCHOSCOPY  2006  . COLONOSCOPY    . EXTERNAL FIXATION LEG Bilateral 10/26/2018   Procedure: LEFT LEGEXTERNAL FIXATION .Marland KitchenSPLINT CHANGE RIGHT LEG;  Surgeon: Shona Needles, MD;  Location: [facility];  Service: Orthopedics;  Laterality: Bilateral;  . EXTERNAL FIXATION REMOVAL  11/02/2018   Procedure: REMOVAL EXTERNAL FIXATION LEG;   Surgeon: Shona Needles, MD;  Location: [facility];  Service: Orthopedics;;  . HERNIA REPAIR     age 67  . KIDNEY STONE SURGERY     cystocopy  . [place]   getting knee to bend- had a previous fracture  . OPEN REDUCTION INTERNAL FIXATION (ORIF) TIBIA/FIBULA FRACTURE Left 11/02/2018   Procedure: OPEN REDUCTION INTERNAL FIXATION (ORIF) LEFT PILON FRACTURE;  Surgeon: Shona Needles, MD;  Location: [facility];  Service: Orthopedics;  Laterality: Left;  . ORIF CALCANEOUS FRACTURE Right 11/02/2018   Procedure: OPEN REDUCTION INTERNAL FIXATION (ORIF) CALCANEOUS FRACTURE;  Surgeon: Shona Needles, MD;  Location: [facility];  Service: Orthopedics;  Laterality: Right;    Family History  Problem Relation Age of Onset  . Stroke Other        Grandfather father older age  . Colon cancer Neg Hx   . Esophageal cancer Neg Hx   . Rectal cancer Neg Hx   . Stomach cancer Neg Hx    Social History:  reports that he has never smoked. He has never used smokeless tobacco. He reports current alcohol use of about 6.0 standard drinks of alcohol per week. He reports that he does not use drugs.  Allergies:  Allergies  Allergen Reactions  . Morphine And Related Nausea Only    Medications Prior to Admission  Medication Sig Dispense Refill  . amLODipine (NORVASC) 5 MG  tablet TAKE 1 TABLET BY MOUTH EVERY DAY 90 tablet 1  . amphetamine-dextroamphetamine (ADDERALL XR) 20 MG 24 hr capsule Take 1 capsule (20 mg total) by mouth daily for 30 days. 30 capsule 0  . losartan (COZAAR) 100 MG tablet TAKE 1 TABLET (100 MG TOTAL) BY MOUTH DAILY. DUE FOR ANNUAL VISIT 30 tablet 1  . vitamin C (ASCORBIC ACID) 500 MG tablet Take 500 mg by mouth daily.      No results found for this or any previous visit (from the past 48 hour(s)). No results found.  Review of Systems  Constitutional: Negative.   HENT: Negative.   Eyes: Negative.   Cardiovascular: Negative.   Gastrointestinal: Negative.   Musculoskeletal:        Right lateral ankle and foot pains.  Skin: Negative.   Neurological: Negative.   Psychiatric/Behavioral: Negative.     Blood pressure (!) 120/99, pulse 86, temperature 98.6 F (37 C), temperature source Oral, resp. rate 13, height 5' 7.5" (1.715 m), weight 73.6 kg, SpO2 98 %. Physical Exam  Constitutional: He appears well-developed.  HENT:  Head: Normocephalic.  Eyes: Conjunctivae are normal.  Neck: Neck supple.  Cardiovascular: Normal rate.  Respiratory: Effort normal.  GI: Soft.  Musculoskeletal:     Comments: Right ankle demonstrates well-healed surgical incision along the sinus Tarsi region.  Patient has swelling along the peroneal tendons.  He has tenderness palpation the retro-fibular groove along the peroneal tendons proximally and distally.  Palpable orthopedic hardware along the calcaneus.  He has pain with resisted hindfoot eversion.  Difficult to determine whether tendons are subluxating due to swelling.  Sensation intact on the dorsal and plantar foot.  Palpable dorsalis pedis pulse.  Neurological: He is alert.  Skin: Skin is warm.  Psychiatric: He has a normal mood and affect.     Assessment/Plan We will proceed with planned hardware removal from the right calcaneus and expiration of the peroneal tendons and repair versus reconstruction versus tenodesis as needed.  Will likely require superior peroneal retinaculum reconstruction and fibular groove deepening if needed.  Patient understands the risk, benefits alternatives of surgery which include continued pain, weakness, need for further surgery, damage surrounding structures as well as perioperative and anesthetic risk which include death.  Postoperatively he will be placed in a splint and made nonweightbearing.  He would like to proceed with surgery.  Erle Crocker, MD 06/04/2019, 8:34 AM

## 2019-06-04 NOTE — Transfer of Care (Signed)
Immediate Anesthesia Transfer of Care Note  Patient: Barry Silva  Procedure(s) Performed: RIGHT FOOT HARDWARE REMOVAL (Right ) EXPLORATION AND REPAIR VS RECONSTRUCTION PERONEAL TENDONS (Right )  Patient Location: PACU  Anesthesia Type:GA combined with regional for post-op pain  Level of Consciousness: sedated and patient cooperative  Airway & Oxygen Therapy: Patient Spontanous Breathing and Patient connected to nasal cannula oxygen  Post-op Assessment: Report given to RN and Post -op Vital signs reviewed and stable  Post vital signs: Reviewed and stable  Last Vitals:  Vitals Value Taken Time  BP 102/71 06/04/19 1036  Temp    Pulse 75 06/04/19 1038  Resp 20 06/04/19 1038  SpO2 99 % 06/04/19 1038  Vitals shown include unvalidated device data.  Last Pain:  Vitals:   06/04/19 0735  TempSrc: Oral  PainSc: 0-No pain         Complications: No apparent anesthesia complications

## 2019-06-04 NOTE — Anesthesia Procedure Notes (Signed)
Anesthesia Regional Block: Adductor canal block   Pre-Anesthetic Checklist: ,, timeout performed, Correct Patient, Correct Site, Correct Laterality, Correct Procedure, Correct Position, site marked, Risks and benefits discussed,  Surgical consent,  Pre-op evaluation,  At surgeon's request and post-op pain management  Laterality: Lower and Right  Prep: chloraprep       Needles:  Injection technique: Single-shot  Needle Type: Echogenic Needle     Needle Length: 9cm  Needle Gauge: 22     Additional Needles:   Procedures:,,,, ultrasound used (permanent image in chart),,,,  Narrative:  Start time: 06/04/2019 8:22 AM End time: 06/04/2019 8:30 AM Injection made incrementally with aspirations every 5 mL.  Performed by: Personally  Anesthesiologist: Barnet Glasgow, MD  Additional Notes: Block assessed prior to surgery. Pt tolerated procedure well.

## 2019-06-05 ENCOUNTER — Encounter (HOSPITAL_BASED_OUTPATIENT_CLINIC_OR_DEPARTMENT_OTHER): Payer: Self-pay | Admitting: Orthopaedic Surgery

## 2019-06-15 ENCOUNTER — Other Ambulatory Visit: Payer: Self-pay | Admitting: Internal Medicine

## 2019-06-19 ENCOUNTER — Other Ambulatory Visit: Payer: Self-pay | Admitting: Internal Medicine

## 2019-06-19 NOTE — Telephone Encounter (Signed)
Please advise pharmacy can not get 100 mg can do 25 or 50 mg

## 2019-06-24 ENCOUNTER — Telehealth: Payer: Self-pay | Admitting: Internal Medicine

## 2019-06-24 MED ORDER — AMPHETAMINE-DEXTROAMPHET ER 20 MG PO CP24: 20.0000 mg | ORAL_CAPSULE | Freq: Every day | ORAL | 0 refills | Status: DC

## 2019-06-24 NOTE — Telephone Encounter (Signed)
Medication Refill - Medication: amphetamine-dextroamphetamine (ADDERALL XR) 20 MG 24 hr capsule  Has the patient contacted their pharmacy? yes (Agent: If no, request that the patient contact the pharmacy for the refill.) (Agent: If yes, when and what did the pharmacy advise?)  Preferred Pharmacy (with phone number or street name):  [id number] [address], [place] [phone] (Phone) [phone] (Fax)   Agent: Please be advised that RX refills may take up to 3 business days. We ask that you follow-up with your pharmacy.

## 2019-06-24 NOTE — Telephone Encounter (Signed)
Sent in electronically .  

## 2019-07-23 ENCOUNTER — Other Ambulatory Visit: Payer: Self-pay | Admitting: Internal Medicine

## 2019-07-23 MED ORDER — AMPHETAMINE-DEXTROAMPHET ER 20 MG PO CP24: 20.0000 mg | ORAL_CAPSULE | Freq: Every day | ORAL | 0 refills | Status: DC

## 2019-07-23 NOTE — Telephone Encounter (Signed)
Sent in electronically .  

## 2019-07-23 NOTE — Telephone Encounter (Signed)
Medication Refill - Medication: amphetamine-dextroamphetamine (ADDERALL XR) 20 MG 24 hr capsule  Preferred Pharmacy:   [id number] [address], [place] (Phone) [phone] (Fax)    Pt was advised that RX refills may take up to 3 business days. We ask that you follow-up with your pharmacy.

## 2019-07-23 NOTE — Telephone Encounter (Signed)
Last ov:03/18/2019 Last refill:06/24/2019

## 2019-08-21 ENCOUNTER — Other Ambulatory Visit: Payer: Self-pay | Admitting: Internal Medicine

## 2019-08-21 MED ORDER — AMPHETAMINE-DEXTROAMPHET ER 20 MG PO CP24: 20.0000 mg | ORAL_CAPSULE | Freq: Every day | ORAL | 0 refills | Status: DC

## 2019-08-21 NOTE — Telephone Encounter (Signed)
amphetamine-dextroamphetamine (ADDERALL XR) 20 MG 24 hr capsule  [facility]

## 2019-08-21 NOTE — Telephone Encounter (Signed)
Last ov:03/18/2019 Last filled:07/23/2019

## 2019-09-23 NOTE — Progress Notes (Signed)
Chief Complaint  Patient presents with  . Nevus    on right side of face near nose pt states that it has been growing and will cut it when washing his face or hitting it by accident     HPI: Barry Silva 58 y.o. come in for problems     And med check    Noted for past year or so  ? Mole right nasal bridge near glasses  But has  May be doubled in size over past 6-8 mons    Bleeds when scrapes accidentally    convalescing  From injuries sustained last fall    Recovered   Second surgery  In [place]   Still some difficulty   Going down stairs .    Right ankle and calf weak feeling  PT  maxed out.  Insurance .   Bp seems ok but up in am  Feels related to busy mind and working afternoons  Low 120/80 range .   adhd running out of med still helpful  No se noted  ROS: See pertinent positives and negatives per HPI.  Past Medical History:  Diagnosis Date  . ADD (attention deficit disorder with hyperactivity)    formal evaluation  . Complication of anesthesia    so cold - was worse than pain from injury- 10/26/18- this was not the an issue  . 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 History of kidney stones   . Hypertension   . Migraine   . MIGRAINE HEADACHE 08/30/2007   Qualifier: Diagnosis of  By: Scherrie Gerlach    . Nephrolithiasis    hx of episode 1/10 calcium stones  . OSA (obstructive sleep apnea)    mild, Apnealink 7 2011 RDI7, O2 nadir 83%  . Refusal of blood transfusions as patient is Jehovah's Witness   . Sarcoidosis    bronchoscopy 2006, mtx started 04/19/10 try off 11/29/10 (atypical cp/ ? worsening ct chest [facility]) off prednisone 7/11    Family History  Problem Relation Age of Onset  . Stroke Other        Grandfather father older age  . Colon cancer Neg Hx   . Esophageal cancer Neg Hx   . Rectal cancer Neg Hx   . Stomach cancer Neg Hx     Social History   Socioeconomic History  . Marital status: Married    Spouse name: Not on file  . Number of  children: Not on file  . Years of education: Not on file  . Highest education level: Not on file  Occupational History  . Not on file  Social Needs  . Financial resource strain: Not on file  . Food insecurity    Worry: Not on file    Inability: Not on file  . Transportation needs    Medical: Not on file    Non-medical: Not on file  Tobacco Use  . Smoking status: Never Smoker  . Smokeless tobacco: Never Used  Substance and Sexual Activity  . Alcohol use: Yes    Alcohol/week: 6.0 standard drinks    Types: 6 Glasses of wine per week    Comment: socially- 11/01/18- none this week  . Drug use: No  . Sexual activity: Not on file  Lifestyle  . Physical activity    Days per week: Not on file    Minutes per session: Not on file  . Stress: Not on file  Relationships  . Social  connections    Talks on phone: Not on file    Gets together: Not on file    Attends religious service: Not on file    Active member of club or organization: Not on file    Attends meetings of clubs or organizations: Not on file    Relationship status: Not on file  Other Topics Concern  . 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   Had to move ;last year and sell house because of financial difficulties in business.   Better now.    M in law died last year was in [facility] and wife caretaker.    Family in [place]   No tobacco or ETS    Outpatient Medications Prior to Visit  Medication Sig Dispense Refill  . amLODipine (NORVASC) 5 MG tablet TAKE 1 TABLET BY MOUTH EVERY DAY 90 tablet 1  . vitamin C (ASCORBIC ACID) 500 MG tablet Take 500 mg by mouth daily.    [name] losartan (COZAAR) 50 MG tablet Take 2 tablets (100 mg total) by mouth daily. 180 tablet 1  . amphetamine-dextroamphetamine (ADDERALL XR) 20 MG 24 hr capsule Take 1 capsule (20 mg total) by mouth daily. 30 capsule 0   No facility-administered medications prior to visit.      EXAM:  BP  136/88 (BP Location: Right Arm, Patient Position: Sitting, Cuff Size: Normal)   Pulse (!) 102   Temp 98.1 F (36.7 C) (Temporal)   Wt 169 lb 9.6 oz (76.9 kg)   SpO2 96%   BMI 26.17 kg/m   Body mass index is 26.17 kg/m.  GENERAL: vitals reviewed and listed above, alert, oriented, appears well hydrated and in no acute distress HEENT: atraumatic, conjunctiva  clear, no obvious abnormalities on inspection of external nose and ears OPmasked   SKIN  On face  Right upper nasal bridge with 3-4 mm pearly pink f flesh colored  lesion  With some capillary  LUNGS: clear to auscultation bilaterally, no wheezes, rales or rhonchi, good air movement CV: HRRR, no clubbing cyanosis onl cap refill  MS: moves all extremities ambulatory  PSYCH: pleasant and cooperative, no obvious depression or anxiety Lab Results  Component Value Date   WBC 13.5 (H) 10/25/2018   HGB 15.2 10/25/2018   HCT 44.7 10/25/2018   PLT 259 10/25/2018   GLUCOSE 92 10/25/2018   CHOL 217 (H) 10/16/2017   TRIG 145.0 10/16/2017   HDL 52.60 10/16/2017   LDLDIRECT 142.6 05/14/2013   LDLCALC 135 (H) 10/16/2017   ALT 25 10/25/2018   AST 33 10/25/2018   NA 140 10/25/2018   K 3.8 10/25/2018   CL 105 10/25/2018   CREATININE 0.98 10/25/2018   BUN 18 10/25/2018   CO2 27 10/25/2018   TSH 1.53 08/16/2016   PSA 1.11 08/16/2016   INR 0.96 11/02/2018   HGBA1C 5.1 10/27/2017   BP Readings from Last 3 Encounters:  09/24/19 136/88  06/04/19 111/84  11/05/18 117/82   Wt Readings from Last 3 Encounters:  09/24/19 169 lb 9.6 oz (76.9 kg)  06/04/19 162 lb 4.1 oz (73.6 kg)  11/02/18 159 lb 13.3 oz (72.5 kg)   Record review  ASSESSMENT AND PLAN:  Discussed the following assessment and plan:  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 - Plan: Ambulatory referral to Dermatology  Skin lesion of face - Plan: Ambulatory referral to Dermatology  Medication management - Plan: Basic metabolic panel, CBC with  Differential/Platelet, Hepatic function panel, Lipid panel, TSH, PSA  Attention deficit hyperactivity disorder (ADHD), other type - Plan: Basic metabolic panel, CBC with Differential/Platelet, Hepatic function panel, Lipid panel, TSH, PSA  Essential hypertension - Plan: Basic metabolic panel, CBC with Differential/Platelet, Hepatic function panel, Lipid panel, TSH, PSA  labs preventive health examination - Plan: Basic metabolic panel, CBC with Differential/Platelet, Hepatic function panel, Lipid panel, TSH, PSA  Screening PSA (prostate specific antigen) - Plan: PSA  Hyperlipidemia, unspecified hyperlipidemia type - Plan: Basic metabolic panel, Hepatic function panel, Lipid panel, TSH Lesion suspicious for basal cell.      Disc  rx  And advise referral to skin surgeon specialist   Due for labs and cpx  Make appt  Plan reviewed   Continue same bp ,med Refill adderall today  Benefit more than risk of medications  to continue.  -Patient advised to return or notify health care team  if  new concerns arise. In interim   Patient Instructions  Plan referral  Concern that this is a basal cell cancer in  A place that specialist should remove .   Reifll adderall today .   Get appt  For fasting lab  And cpx for November .     Standley Brooking. Panosh M.D.

## 2019-09-24 ENCOUNTER — Other Ambulatory Visit: Payer: Self-pay

## 2019-09-24 ENCOUNTER — Ambulatory Visit (INDEPENDENT_AMBULATORY_CARE_PROVIDER_SITE_OTHER): Payer: Managed Care, Other (non HMO) | Admitting: Internal Medicine

## 2019-09-24 ENCOUNTER — Encounter: Payer: Self-pay | Admitting: Internal Medicine

## 2019-09-24 VITALS — BP 136/88 | HR 102 | Temp 98.1°F | Wt 169.6 lb

## 2019-09-24 DIAGNOSIS — Z0000 Encounter for general adult medical examination without abnormal findings: Secondary | ICD-10-CM

## 2019-09-24 DIAGNOSIS — Z79899 Other long term (current) drug therapy: Secondary | ICD-10-CM

## 2019-09-24 DIAGNOSIS — D485 Neoplasm of uncertain behavior of skin: Secondary | ICD-10-CM | POA: Diagnosis not present

## 2019-09-24 DIAGNOSIS — Z125 Encounter for screening for malignant neoplasm of prostate: Secondary | ICD-10-CM

## 2019-09-24 DIAGNOSIS — F908 Attention-deficit hyperactivity disorder, other type: Secondary | ICD-10-CM

## 2019-09-24 DIAGNOSIS — L989 Disorder of the skin and subcutaneous tissue, unspecified: Secondary | ICD-10-CM | POA: Diagnosis not present

## 2019-09-24 DIAGNOSIS — I10 Essential (primary) hypertension: Secondary | ICD-10-CM

## 2019-09-24 DIAGNOSIS — E785 Hyperlipidemia, unspecified: Secondary | ICD-10-CM

## 2019-09-24 MED ORDER — AMPHETAMINE-DEXTROAMPHET ER 20 MG PO CP24: 20.0000 mg | ORAL_CAPSULE | Freq: Every day | ORAL | 0 refills | Status: DC

## 2019-09-24 MED ORDER — LOSARTAN POTASSIUM 50 MG PO TABS: 50.0000 mg | ORAL_TABLET | Freq: Every day | ORAL | 1 refills | Status: DC

## 2019-09-24 NOTE — Patient Instructions (Signed)
Plan referral  Concern that this is a basal cell cancer in  A place that specialist should remove .   Reifll adderall today .   Get appt  For fasting lab  And cpx for November .

## 2019-10-20 DIAGNOSIS — C4491 Basal cell carcinoma of skin, unspecified: Secondary | ICD-10-CM

## 2019-10-20 HISTORY — DX: Basal cell carcinoma of skin, unspecified: C44.91

## 2019-10-21 ENCOUNTER — Other Ambulatory Visit (INDEPENDENT_AMBULATORY_CARE_PROVIDER_SITE_OTHER): Payer: Managed Care, Other (non HMO)

## 2019-10-21 ENCOUNTER — Other Ambulatory Visit: Payer: Self-pay | Admitting: Internal Medicine

## 2019-10-21 ENCOUNTER — Other Ambulatory Visit: Payer: Self-pay

## 2019-10-21 DIAGNOSIS — Z125 Encounter for screening for malignant neoplasm of prostate: Secondary | ICD-10-CM

## 2019-10-21 DIAGNOSIS — Z79899 Other long term (current) drug therapy: Secondary | ICD-10-CM | POA: Diagnosis not present

## 2019-10-21 DIAGNOSIS — F908 Attention-deficit hyperactivity disorder, other type: Secondary | ICD-10-CM

## 2019-10-21 DIAGNOSIS — I10 Essential (primary) hypertension: Secondary | ICD-10-CM

## 2019-10-21 DIAGNOSIS — E785 Hyperlipidemia, unspecified: Secondary | ICD-10-CM

## 2019-10-21 DIAGNOSIS — Z0000 Encounter for general adult medical examination without abnormal findings: Secondary | ICD-10-CM | POA: Diagnosis not present

## 2019-10-21 LAB — BASIC METABOLIC PANEL
BUN: 15 mg/dL (ref 6–23)
CO2: 30 mEq/L (ref 19–32)
Calcium: 9.4 mg/dL (ref 8.4–10.5)
Chloride: 102 mEq/L (ref 96–112)
Creatinine, Ser: 0.92 mg/dL (ref 0.40–1.50)
GFR: 84.92 mL/min (ref >60.00)
Glucose, Bld: 88 mg/dL (ref 70–99)
Potassium: 3.8 mEq/L (ref 3.5–5.1)
Sodium: 139 mEq/L (ref 135–145)

## 2019-10-21 LAB — CBC WITH DIFFERENTIAL/PLATELET
Basophils Absolute: 0.1 10*3/uL (ref 0.0–0.1)
Basophils Relative: 1 % (ref 0.0–3.0)
Eosinophils Absolute: 0.2 10*3/uL (ref 0.0–0.7)
Eosinophils Relative: 2.9 % (ref 0.0–5.0)
HCT: 46.7 % (ref 39.0–52.0)
Hemoglobin: 16.3 g/dL (ref 13.0–17.0)
Lymphocytes Relative: 31.1 % (ref 12.0–46.0)
Lymphs Abs: 1.9 10*3/uL (ref 0.7–4.0)
MCHC: 34.9 g/dL (ref 30.0–36.0)
MCV: 88.2 fl (ref 78.0–100.0)
Monocytes Absolute: 0.9 10*3/uL (ref 0.1–1.0)
Monocytes Relative: 14.1 % — ABNORMAL HIGH (ref 3.0–12.0)
Neutro Abs: 3.1 10*3/uL (ref 1.4–7.7)
Neutrophils Relative %: 50.9 % (ref 43.0–77.0)
Platelets: 281 10*3/uL (ref 150.0–400.0)
RBC: 5.3 Mil/uL (ref 4.22–5.81)
RDW: 13.1 % (ref 11.5–15.5)
WBC: 6 10*3/uL (ref 4.0–10.5)

## 2019-10-21 LAB — LIPID PANEL
Cholesterol: 220 mg/dL — ABNORMAL HIGH (ref 0–200)
HDL: 44.1 mg/dL (ref >39.00)
LDL Cholesterol: 147 mg/dL — ABNORMAL HIGH (ref 0–99)
NonHDL: 175.79
Total CHOL/HDL Ratio: 5
Triglycerides: 143 mg/dL (ref 0.0–149.0)
VLDL: 28.6 mg/dL (ref 0.0–40.0)

## 2019-10-21 LAB — HEPATIC FUNCTION PANEL
ALT: 23 U/L (ref 0–53)
AST: 22 U/L (ref 0–37)
Albumin: 4.3 g/dL (ref 3.5–5.2)
Alkaline Phosphatase: 80 U/L (ref 39–117)
Bilirubin, Direct: 0.2 mg/dL (ref 0.0–0.3)
Total Bilirubin: 1.6 mg/dL — ABNORMAL HIGH (ref 0.2–1.2)
Total Protein: 6.7 g/dL (ref 6.0–8.3)

## 2019-10-21 LAB — PSA: PSA: 1.04 ng/mL (ref 0.10–4.00)

## 2019-10-21 LAB — TSH: TSH: 1.45 u[IU]/mL (ref 0.35–4.50)

## 2019-10-21 MED ORDER — AMPHETAMINE-DEXTROAMPHET ER 20 MG PO CP24: 20.0000 mg | ORAL_CAPSULE | Freq: Every day | ORAL | 0 refills | Status: DC

## 2019-10-21 NOTE — Telephone Encounter (Signed)
Patient needs a refill for Adderall called in to [facility] on [address].

## 2019-10-21 NOTE — Telephone Encounter (Signed)
Last ov:09/24/2019 Last filled:09/24/2019 

## 2019-10-31 NOTE — Progress Notes (Deleted)
No chief complaint on file.   HPI: Patient  Barry Silva  58 y.o. comes in today for Preventive Health Care visit   Health Maintenance  Topic Date Due  . HIV Screening  [birth year]/1978  . COLONOSCOPY  05/09/2019  . INFLUENZA VACCINE  07/20/2019  . TETANUS/TDAP  10/17/2027  . Hepatitis C Screening  Completed   Health Maintenance Review LIFESTYLE:  Exercise:   Tobacco/ETS: Alcohol:  Sugar beverages: Sleep: Drug use: no HH of  Work:    ROS:  GEN/ HEENT: No fever, significant weight changes sweats headaches vision problems hearing changes, CV/ PULM; No chest pain shortness of breath cough, syncope,edema  change in exercise tolerance. GI /GU: No adominal pain, vomiting, change in bowel habits. No blood in the stool. No significant GU symptoms. SKIN/HEME: ,no acute skin rashes suspicious lesions or bleeding. No lymphadenopathy, nodules, masses.  NEURO/ PSYCH:  No neurologic signs such as weakness numbness. No depression anxiety. IMM/ Allergy: No unusual infections.  Allergy .   REST of 12 system review negative except as per HPI   Past Medical History:  Diagnosis Date  . ADD (attention deficit disorder with hyperactivity)    formal evaluation  . Complication of anesthesia    so cold - was worse than pain from injury- 10/26/18- this was not the an issue  . 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 History of kidney stones   . Hypertension   . Migraine   . MIGRAINE HEADACHE 08/30/2007   Qualifier: Diagnosis of  By: Scherrie Gerlach    . Nephrolithiasis    hx of episode 1/10 calcium stones  . OSA (obstructive sleep apnea)    mild, Apnealink 7 2011 RDI7, O2 nadir 83%  . Refusal of blood transfusions as patient is Jehovah's Witness   . Sarcoidosis    bronchoscopy 2006, mtx started 04/19/10 try off 11/29/10 (atypical cp/ ? worsening ct chest [facility]) off prednisone 7/11    Past Surgical History:  Procedure Laterality Date  . BRONCHOSCOPY  2006  . COLONOSCOPY     . EXTERNAL FIXATION LEG Bilateral 10/26/2018   Procedure: LEFT LEGEXTERNAL FIXATION .Marland KitchenSPLINT CHANGE RIGHT LEG;  Surgeon: Shona Needles, MD;  Location: [place];  Service: Orthopedics;  Laterality: Bilateral;  . EXTERNAL FIXATION REMOVAL  11/02/2018   Procedure: REMOVAL EXTERNAL FIXATION LEG;  Surgeon: Shona Needles, MD;  Location: [place];  Service: Orthopedics;;  . HARDWARE REMOVAL Right 06/04/2019   Procedure: RIGHT FOOT HARDWARE REMOVAL;  Surgeon: Erle Crocker, MD;  Location: [place];  Service: Orthopedics;  Laterality: Right;  . HERNIA REPAIR     age 32  . KIDNEY STONE SURGERY     cystocopy  . [place]   getting knee to bend- had a previous fracture  . OPEN REDUCTION INTERNAL FIXATION (ORIF) TIBIA/FIBULA FRACTURE Left 11/02/2018   Procedure: OPEN REDUCTION INTERNAL FIXATION (ORIF) LEFT PILON FRACTURE;  Surgeon: Shona Needles, MD;  Location: [place];  Service: Orthopedics;  Laterality: Left;  . ORIF CALCANEOUS FRACTURE Right 11/02/2018   Procedure: OPEN REDUCTION INTERNAL FIXATION (ORIF) CALCANEOUS FRACTURE;  Surgeon: Shona Needles, MD;  Location: [place];  Service: Orthopedics;  Laterality: Right;  . TENDON EXPLORATION Right 06/04/2019   Procedure: EXPLORATION AND REPAIR  PERONEAL TENDONS;  Surgeon: Erle Crocker, MD;  Location: [place];  Service: Orthopedics;  Laterality: Right;    Family History  Problem  Relation Age of Onset  . Stroke Other        Grandfather father older age  . Colon cancer Neg Hx   . Esophageal cancer Neg Hx   . Rectal cancer Neg Hx   . Stomach cancer Neg Hx     Social History   Socioeconomic History  . Marital status: Married    Spouse name: Not on file  . Number of children: Not on file  . Years of education: Not on file  . Highest education level: Not on file  Occupational History  . Not on file  Social Needs  . Financial resource strain: Not on file  . Food insecurity    Worry:  Not on file    Inability: Not on file  . Transportation needs    Medical: Not on file    Non-medical: Not on file  Tobacco Use  . Smoking status: Never Smoker  . Smokeless tobacco: Never Used  Substance and Sexual Activity  . Alcohol use: Yes    Alcohol/week: 6.0 standard drinks    Types: 6 Glasses of wine per week    Comment: socially- 11/01/18- none this week  . Drug use: No  . Sexual activity: Not on file  Lifestyle  . Physical activity    Days per week: Not on file    Minutes per session: Not on file  . Stress: Not on file  Relationships  . Social Herbalist on phone: Not on file    Gets together: Not on file    Attends religious service: Not on file    Active member of club or organization: Not on file    Attends meetings of clubs or organizations: Not on file    Relationship status: Not on file  Other Topics Concern  . 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   Had to move ;last year and sell house because of financial difficulties in business.   Better now.    M in law died last year was in [facility] and wife caretaker.    Family in [place]   No tobacco or ETS    Outpatient Medications Prior to Visit  Medication Sig Dispense Refill  . amLODipine (NORVASC) 5 MG tablet TAKE 1 TABLET BY MOUTH EVERY DAY 90 tablet 1  . amphetamine-dextroamphetamine (ADDERALL XR) 20 MG 24 hr capsule Take 1 capsule (20 mg total) by mouth daily. 30 capsule 0  . losartan (COZAAR) 50 MG tablet Take 1 tablet (50 mg total) by mouth daily. 90 tablet 1  . vitamin C (ASCORBIC ACID) 500 MG tablet Take 500 mg by mouth daily.     No facility-administered medications prior to visit.      EXAM:  There were no vitals taken for this visit.  There is no height or weight on file to calculate BMI. Wt Readings from Last 3 Encounters:  09/24/19 169 lb 9.6 oz (76.9 kg)  06/04/19 162 lb 4.1 oz (73.6 kg)  11/02/18 159 lb 13.3 oz  (72.5 kg)    Physical Exam: Vital signs reviewed [id number] is a well-developed well-nourished alert cooperative    who appearsr stated age in no acute distress.  HEENT: normocephalic atraumatic , Eyes: PERRL EOM's full, conjunctiva clear, Nares: paten,t no deformity discharge or tenderness., Ears: no deformity EAC's clear TMs with normal landmarks. Mouth: clear OP, no lesions, edema.  Moist  mucous membranes. Dentition in adequate repair. NECK: supple without masses, thyromegaly or bruits. CHEST/PULM:  Clear to auscultation and percussion breath sounds equal no wheeze , rales or rhonchi. No chest wall deformities or tenderness. Breast: normal by inspection . No dimpling, discharge, masses, tenderness or discharge . CV: PMI is nondisplaced, S1 S2 no gallops, murmurs, rubs. Peripheral pulses are full without delay.No JVD .  ABDOMEN: Bowel sounds normal nontender  No guard or rebound, no hepato splenomegal no CVA tenderness.  No hernia. Extremtities:  No clubbing cyanosis or edema, no acute joint swelling or redness no focal atrophy NEURO:  Oriented x3, cranial nerves 3-12 appear to be intact, no obvious focal weakness,gait within normal limits no abnormal reflexes or asymmetrical SKIN: No acute rashes normal turgor, color, no bruising or petechiae. PSYCH: Oriented, good eye contact, no obvious depression anxiety, cognition and judgment appear normal. LN: no cervical axillary inguinal adenopathy  Lab Results  Component Value Date   WBC 6.0 10/21/2019   HGB 16.3 10/21/2019   HCT 46.7 10/21/2019   PLT 281.0 10/21/2019   GLUCOSE 88 10/21/2019   CHOL 220 (H) 10/21/2019   TRIG 143.0 10/21/2019   HDL 44.10 10/21/2019   LDLDIRECT 142.6 05/14/2013   LDLCALC 147 (H) 10/21/2019   ALT 23 10/21/2019   AST 22 10/21/2019   NA 139 10/21/2019   K 3.8 10/21/2019   CL 102 10/21/2019   CREATININE 0.92 10/21/2019   BUN 15 10/21/2019   CO2 30 10/21/2019   TSH 1.45 10/21/2019   PSA 1.04 10/21/2019   INR  0.96 11/02/2018   HGBA1C 5.1 10/27/2017    BP Readings from Last 3 Encounters:  09/24/19 136/88  06/04/19 111/84  11/05/18 117/82    Lab results reviewed with patient   ASSESSMENT AND PLAN:  Discussed the following assessment and plan:  No diagnosis found.  Patient Care Team: Ezme Duch, Standley Brooking, MD as PCP - Shirlean Kelly, MD (Pulmonary Disease) There are no Patient Instructions on file for this visit.  Standley Brooking. Tyrik Stetzer M.D.

## 2019-11-01 ENCOUNTER — Ambulatory Visit: Payer: Managed Care, Other (non HMO) | Admitting: Internal Medicine

## 2019-11-04 NOTE — Progress Notes (Signed)
Chief Complaint  Patient presents with  . Annual Exam    Pt has no concerns   . Medication Management    HPI: Patient  Barry Silva  58 y.o. comes in today for Preventive Health Care visit  And med check    ADHDadderall  Still helps Most days  taks  At first getting up.  7-9   BP: taking  Readings  And normal 120 130/ 80  Take meds in am.   recovering from leg fractures and surgery  Dec dorsiflexion left dec lateral med rotation right  Currently PT paying gout of pocket  Still problem  Going down stairs but otherwise nl activity  Hernia not bothering at this point  Derm right nasal was basal cell CAand to have mohs surgery  Sarcoid no sx  X throat clearing  No sob cough   Health Maintenance  Topic Date Due  . HIV Screening  03/[birth year]/1978  . COLONOSCOPY  05/09/2019  . INFLUENZA VACCINE  03/18/2020 (Originally 07/20/2019)  . TETANUS/TDAP  10/17/2027  . Hepatitis C Screening  Completed   Health Maintenance Review LIFESTYLE:  Exercise:   Mountain biking .  Tobacco/ETS: no Alcohol:  4-5 per week Sugar beverages: no Sleep: 5-6  Drug use: no HH of  3 Work: owns business.     ROS:  GEN/ HEENT: No fever, significant weight changes sweats headaches vision problems hearing changes, CV/ PULM; No chest pain shortness of breath cough, syncope,edema  change in exercise tolerance. GI /GU: No adominal pain, vomiting, change in bowel habits. No blood in the stool. No significant GU symptoms. SKIN/HEME: ,no acute skin rashes suspicious lesions or bleeding. No lymphadenopathy, nodules, masses.  NEURO/ PSYCH:  No neurologic signs such as weakness numbness. No depression anxiety. IMM/ Allergy: No unusual infections.  Allergy .   REST of 12 system review negative except as per HPI   Past Medical History:  Diagnosis Date  . Abnormal LFTs 12/01/2014   neg serologies avoid reg liver toxins and repeat in 2 months consider abd [place]  other labs if needed   . ADD (attention deficit  disorder with hyperactivity)    formal evaluation  . Complication of anesthesia    so cold - was worse than pain from injury- 10/26/18- this was not the an issue  . 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 History of kidney stones   . Hypertension   . Migraine   . MIGRAINE HEADACHE 08/30/2007   Qualifier: Diagnosis of  By: Scherrie Gerlach    . Nephrolithiasis    hx of episode 1/10 calcium stones  . OSA (obstructive sleep apnea)    mild, Apnealink 7 2011 RDI7, O2 nadir 83%  . Refusal of blood transfusions as patient is Jehovah's Witness   . Sarcoidosis    bronchoscopy 2006, mtx started 04/19/10 try off 11/29/10 (atypical cp/ ? worsening ct chest wl[facility]) off prednisone 7/11    Past Surgical History:  Procedure Laterality Date  . BRONCHOSCOPY  2006  . COLONOSCOPY    . EXTERNAL FIXATION LEG Bilateral 10/26/2018   Procedure: LEFT LEGEXTERNAL FIXATION .Marland KitchenSPLINT CHANGE RIGHT LEG;  Surgeon: Shona Needles, MD;  Location: [facility];  Service: Orthopedics;  Laterality: Bilateral;  . EXTERNAL FIXATION REMOVAL  11/02/2018   Procedure: REMOVAL EXTERNAL FIXATION LEG;  Surgeon: Shona Needles, MD;  Location: [facility];  Service: Orthopedics;;  . HARDWARE REMOVAL Right 06/04/2019   Procedure: RIGHT FOOT HARDWARE REMOVAL;  Surgeon: Erle Crocker, MD;  Location: [facility];  Service: Orthopedics;  Laterality: Right;  . HERNIA REPAIR     age 51  . KIDNEY STONE SURGERY     cystocopy  . [place]   getting knee to bend- had a previous fracture  . OPEN REDUCTION INTERNAL FIXATION (ORIF) TIBIA/FIBULA FRACTURE Left 11/02/2018   Procedure: OPEN REDUCTION INTERNAL FIXATION (ORIF) LEFT PILON FRACTURE;  Surgeon: Shona Needles, MD;  Location: [facility];  Service: Orthopedics;  Laterality: Left;  . ORIF CALCANEOUS FRACTURE Right 11/02/2018   Procedure: OPEN REDUCTION INTERNAL FIXATION (ORIF) CALCANEOUS FRACTURE;  Surgeon: Shona Needles, MD;  Location: [facility];  Service:  Orthopedics;  Laterality: Right;  . TENDON EXPLORATION Right 06/04/2019   Procedure: EXPLORATION AND REPAIR  PERONEAL TENDONS;  Surgeon: Erle Crocker, MD;  Location: [facility];  Service: Orthopedics;  Laterality: Right;    Family History  Problem Relation Age of Onset  . Stroke Other        Grandfather father older age  . Colon cancer Neg Hx   . Esophageal cancer Neg Hx   . Rectal cancer Neg Hx   . Stomach cancer Neg Hx     Social History   Socioeconomic History  . Marital status: Married    Spouse name: Not on file  . Number of children: Not on file  . Years of education: Not on file  . Highest education level: Not on file  Occupational History  . Not on file  Social Needs  . Financial resource strain: Not on file  . Food insecurity    Worry: Not on file    Inability: Not on file  . Transportation needs    Medical: Not on file    Non-medical: Not on file  Tobacco Use  . Smoking status: Never Smoker  . Smokeless tobacco: Never Used  Substance and Sexual Activity  . Alcohol use: Yes    Alcohol/week: 6.0 standard drinks    Types: 6 Glasses of wine per week    Comment: socially- 11/01/18- none this week  . Drug use: No  . Sexual activity: Not on file  Lifestyle  . Physical activity    Days per week: Not on file    Minutes per session: Not on file  . Stress: Not on file  Relationships  . Social Herbalist on phone: Not on file    Gets together: Not on file    Attends religious service: Not on file    Active member of club or organization: Not on file    Attends meetings of clubs or organizations: Not on file    Relationship status: Not on file  Other Topics Concern  . Not on file  Social History Narrative   Married   [place] of 2   Works  owns [employer]  Active physically .   Had to move ;last year and sell house because of financial difficulties in business.   Better now.    M in law died last year was in [facility] and wife  caretaker.    Family in [place]   No tobacco or ETS    Outpatient Medications Prior to Visit  Medication Sig Dispense Refill  . amLODipine (NORVASC) 5 MG tablet TAKE 1 TABLET BY MOUTH EVERY DAY 90 tablet 1  . amphetamine-dextroamphetamine (ADDERALL XR) 20 MG 24 hr capsule Take 1 capsule (20 mg total) by mouth  daily. 30 capsule 0  . losartan (COZAAR) 50 MG tablet Take 1 tablet (50 mg total) by mouth daily. 90 tablet 1  . vitamin C (ASCORBIC ACID) 500 MG tablet Take 500 mg by mouth daily.     No facility-administered medications prior to visit.      EXAM:  BP 128/68 (BP Location: Right Arm, Patient Position: Sitting, Cuff Size: Normal)   Pulse 75   Temp 98.1 F (36.7 C) (Temporal)   Ht 5\' 8"  (1.727 m)   Wt 168 lb 6.4 oz (76.4 kg)   SpO2 98%   BMI 25.61 kg/m   Body mass index is 25.61 kg/m. Wt Readings from Last 3 Encounters:  11/05/19 168 lb 6.4 oz (76.4 kg)  09/24/19 169 lb 9.6 oz (76.9 kg)  06/04/19 162 lb 4.1 oz (73.6 kg)    Physical Exam: Vital signs reviewed [id number] is a well-developed well-nourished alert cooperative    who appearsr stated age in no acute distress.  HEENT: normocephalic atraumatic , Eyes: PERRL EOM's full, conjunctiva clear, ., Ears: no deformity EAC's clear TMs with normal landmarks. Mouthmasked r. NECK: supple without masses, thyromegaly or bruits. CHEST/PULM:  Clear to auscultation and percussion breath sounds equal no wheeze , rales or rhonchi. No chest wall deformities or tenderness.  . CV: PMI is nondisplaced, S1 S2 no gallops, murmurs, rubs. Peripheral pulses are full without delay.No JVD .  ABDOMEN: Bowel sounds normal nontender  No guard or rebound, no hepato splenomegal no CVA tenderness.   Extremtities:  No clubbing cyanosis or edema, no acute joint swelling or redness healed scars le  Dec dorsi left right dec  Mass  Pulses intack no edema NEURO:  Oriented x3, cranial nerves 3-12 appear to be intact, ,gait within normal limits no abnormal  reflexes or asymmetrical SKIN: No acute rashes normal turgor, color, no bruising or petechiae.  Face areas coverd from bx .  PSYCH: Oriented, good eye contact, no obvious depression anxiety, cognition and judgment appear normal. LN: no cervical axillary inguinal adenopathy  Lab Results  Component Value Date   WBC 6.0 10/21/2019   HGB 16.3 10/21/2019   HCT 46.7 10/21/2019   PLT 281.0 10/21/2019   GLUCOSE 88 10/21/2019   CHOL 220 (H) 10/21/2019   TRIG 143.0 10/21/2019   HDL 44.10 10/21/2019   LDLDIRECT 142.6 05/14/2013   LDLCALC 147 (H) 10/21/2019   ALT 23 10/21/2019   AST 22 10/21/2019   NA 139 10/21/2019   K 3.8 10/21/2019   CL 102 10/21/2019   CREATININE 0.92 10/21/2019   BUN 15 10/21/2019   CO2 30 10/21/2019   TSH 1.45 10/21/2019   PSA 1.04 10/21/2019   INR 0.96 11/02/2018   HGBA1C 5.1 10/27/2017    BP Readings from Last 3 Encounters:  11/05/19 128/68  09/24/19 136/88  06/04/19 111/84   Wt Readings from Last 3 Encounters:  11/05/19 168 lb 6.4 oz (76.4 kg)  09/24/19 169 lb 9.6 oz (76.9 kg)  06/04/19 162 lb 4.1 oz (73.6 kg)   Reviewed labs   ASSESSMENT AND PLAN:  Discussed the following assessment and plan:    ICD-10-CM   1. Visit for preventive health examination  Z00.00   2. Medication management  Z79.899   3. Essential hypertension  I10   4. Attention deficit hyperactivity disorder (ADHD), other type  F90.8   5. Hyperlipidemia, unspecified hyperlipidemia type  E78.5    Doing well  Recovering    Benefit more than risk of medications  to  continue.  bp controlled at this time  Contact us for refills can ask for   3 x 30 adderall  Still declines  influenzae but  would get covid vaccine when available  Return in about 6 months (around 05/04/2020) for medication on line  or in person .  Patient Care Team: Slevin Gunby, Standley Brooking, MD as PCP - General Chesley Mires, MD (Pulmonary Disease) Patient Instructions  Glad you are doing  well. recovering.  Contact us for  refills .   Cholesterol .   Could be better .      Health Maintenance, Male Adopting a healthy lifestyle and getting preventive care are important in promoting health and wellness. Ask your health care provider about:  The right schedule for you to have regular tests and exams.  Things you can do on your own to prevent diseases and keep yourself healthy. What should I know about diet, weight, and exercise? Eat a healthy diet   Eat a diet that includes plenty of vegetables, fruits, low-fat dairy products, and lean protein.  Do not eat a lot of foods that are high in solid fats, added sugars, or sodium. Maintain a healthy weight Body mass index (BMI) is a measurement that can be used to identify possible weight problems. It estimates body fat based on height and weight. Your health care provider can help determine your BMI and help you achieve or maintain a healthy weight. Get regular exercise Get regular exercise. This is one of the most important things you can do for your health. Most adults should:  Exercise for at least 150 minutes each week. The exercise should increase your heart rate and make you sweat (moderate-intensity exercise).  Do strengthening exercises at least twice a week. This is in addition to the moderate-intensity exercise.  Spend less time sitting. Even light physical activity can be beneficial. Watch cholesterol and blood lipids Have your blood tested for lipids and cholesterol at 56 years of age, then have this test every 5 years. You may need to have your cholesterol levels checked more often if:  Your lipid or cholesterol levels are high.  You are older than 56 years of age.  You are at high risk for heart disease. What should I know about cancer screening? Many types of cancers can be detected early and may often be prevented. Depending on your health history and family history, you may need to have cancer screening at various ages. This may include  screening for:  Colorectal cancer.  Prostate cancer.  Skin cancer.  Lung cancer. What should I know about heart disease, diabetes, and high blood pressure? Blood pressure and heart disease  High blood pressure causes heart disease and increases the risk of stroke. This is more likely to develop in people who have high blood pressure readings, are of African descent, or are overweight.  Talk with your health care provider about your target blood pressure readings.  Have your blood pressure checked: ? Every 3-5 years if you are 25-87 years of age. ? Every year if you are 84 years old or older.  If you are between the ages of 71 and 73 and are a current or former smoker, ask your health care provider if you should have a one-time screening for abdominal aortic aneurysm (AAA). Diabetes Have regular diabetes screenings. This checks your fasting blood sugar level. Have the screening done:  Once every three years after age 70 if you are at a normal weight and have a  low risk for diabetes.  More often and at a younger age if you are overweight or have a high risk for diabetes. What should I know about preventing infection? Hepatitis B If you have a higher risk for hepatitis B, you should be screened for this virus. Talk with your health care provider to find out if you are at risk for hepatitis B infection. Hepatitis C Blood testing is recommended for:  Everyone born from [birth year] through 1965.  Anyone with known risk factors for hepatitis C. Sexually transmitted infections (STIs)  You should be screened each year for STIs, including gonorrhea and chlamydia, if: ? You are sexually active and are younger than 56 years of age. ? You are older than 56 years of age and your health care provider tells you that you are at risk for this type of infection. ? Your sexual activity has changed since you were last screened, and you are at increased risk for chlamydia or gonorrhea. Ask your health  care provider if you are at risk.  Ask your health care provider about whether you are at high risk for HIV. Your health care provider may recommend a prescription medicine to help prevent HIV infection. If you choose to take medicine to prevent HIV, you should first get tested for HIV. You should then be tested every 3 months for as long as you are taking the medicine. Follow these instructions at home: Lifestyle  Do not use any products that contain nicotine or tobacco, such as cigarettes, e-cigarettes, and chewing tobacco. If you need help quitting, ask your health care provider.  Do not use street drugs.  Do not share needles.  Ask your health care provider for help if you need support or information about quitting drugs. Alcohol use  Do not drink alcohol if your health care provider tells you not to drink.  If you drink alcohol: ? Limit how much you have to 0-2 drinks a day. ? Be aware of how much alcohol is in your drink. In the [place] one drink equals one 12 oz bottle of beer (355 mL), one 5 oz glass of wine (148 mL), or one 1 oz glass of hard liquor (44 mL). General instructions  Schedule regular health, dental, and eye exams.  Stay current with your vaccines.  Tell your health care provider if: ? You often feel depressed. ? You have ever been abused or do not feel safe at home. Summary  Adopting a healthy lifestyle and getting preventive care are important in promoting health and wellness.  Follow your health care provider's instructions about healthy diet, exercising, and getting tested or screened for diseases.  Follow your health care provider's instructions on monitoring your cholesterol and blood pressure. This information is not intended to replace advice given to you by your health care provider. Make sure you discuss any questions you have with your health care provider. Document Released: 06/02/2008 Document Revised: 11/28/2018 Document Reviewed: 11/28/2018  Elsevier Patient Education  2020 [place] Magdalyn Arenivas M.D.

## 2019-11-04 NOTE — Patient Instructions (Addendum)
Glad you are doing  well. recovering.  Contact us for refills .   Cholesterol .   Could be better .      Health Maintenance, Male Adopting a healthy lifestyle and getting preventive care are important in promoting health and wellness. Ask your health care provider about:  The right schedule for you to have regular tests and exams.  Things you can do on your own to prevent diseases and keep yourself healthy. What should I know about diet, weight, and exercise? Eat a healthy diet   Eat a diet that includes plenty of vegetables, fruits, low-fat dairy products, and lean protein.  Do not eat a lot of foods that are high in solid fats, added sugars, or sodium. Maintain a healthy weight Body mass index (BMI) is a measurement that can be used to identify possible weight problems. It estimates body fat based on height and weight. Your health care provider can help determine your BMI and help you achieve or maintain a healthy weight. Get regular exercise Get regular exercise. This is one of the most important things you can do for your health. Most adults should:  Exercise for at least 150 minutes each week. The exercise should increase your heart rate and make you sweat (moderate-intensity exercise).  Do strengthening exercises at least twice a week. This is in addition to the moderate-intensity exercise.  Spend less time sitting. Even light physical activity can be beneficial. Watch cholesterol and blood lipids Have your blood tested for lipids and cholesterol at 56 years of age, then have this test every 5 years. You may need to have your cholesterol levels checked more often if:  Your lipid or cholesterol levels are high.  You are older than 56 years of age.  You are at high risk for heart disease. What should I know about cancer screening? Many types of cancers can be detected early and may often be prevented. Depending on your health history and family history, you may need to  have cancer screening at various ages. This may include screening for:  Colorectal cancer.  Prostate cancer.  Skin cancer.  Lung cancer. What should I know about heart disease, diabetes, and high blood pressure? Blood pressure and heart disease  High blood pressure causes heart disease and increases the risk of stroke. This is more likely to develop in people who have high blood pressure readings, are of African descent, or are overweight.  Talk with your health care provider about your target blood pressure readings.  Have your blood pressure checked: ? Every 3-5 years if you are 41-33 years of age. ? Every year if you are 66 years old or older.  If you are between the ages of 20 and 18 and are a current or former smoker, ask your health care provider if you should have a one-time screening for abdominal aortic aneurysm (AAA). Diabetes Have regular diabetes screenings. This checks your fasting blood sugar level. Have the screening done:  Once every three years after age 65 if you are at a normal weight and have a low risk for diabetes.  More often and at a younger age if you are overweight or have a high risk for diabetes. What should I know about preventing infection? Hepatitis B If you have a higher risk for hepatitis B, you should be screened for this virus. Talk with your health care provider to find out if you are at risk for hepatitis B infection. Hepatitis C Blood testing is recommended  for:  Everyone born from [birth year] through 1963.  Anyone with known risk factors for hepatitis C. Sexually transmitted infections (STIs)  You should be screened each year for STIs, including gonorrhea and chlamydia, if: ? You are sexually active and are younger than 56 years of age. ? You are older than 56 years of age and your health care provider tells you that you are at risk for this type of infection. ? Your sexual activity has changed since you were last screened, and you are at  increased risk for chlamydia or gonorrhea. Ask your health care provider if you are at risk.  Ask your health care provider about whether you are at high risk for HIV. Your health care provider may recommend a prescription medicine to help prevent HIV infection. If you choose to take medicine to prevent HIV, you should first get tested for HIV. You should then be tested every 3 months for as long as you are taking the medicine. Follow these instructions at home: Lifestyle  Do not use any products that contain nicotine or tobacco, such as cigarettes, e-cigarettes, and chewing tobacco. If you need help quitting, ask your health care provider.  Do not use street drugs.  Do not share needles.  Ask your health care provider for help if you need support or information about quitting drugs. Alcohol use  Do not drink alcohol if your health care provider tells you not to drink.  If you drink alcohol: ? Limit how much you have to 0-2 drinks a day. ? Be aware of how much alcohol is in your drink. In the [place] one drink equals one 12 oz bottle of beer (355 mL), one 5 oz glass of wine (148 mL), or one 1 oz glass of hard liquor (44 mL). General instructions  Schedule regular health, dental, and eye exams.  Stay current with your vaccines.  Tell your health care provider if: ? You often feel depressed. ? You have ever been abused or do not feel safe at home. Summary  Adopting a healthy lifestyle and getting preventive care are important in promoting health and wellness.  Follow your health care provider's instructions about healthy diet, exercising, and getting tested or screened for diseases.  Follow your health care provider's instructions on monitoring your cholesterol and blood pressure. This information is not intended to replace advice given to you by your health care provider. Make sure you discuss any questions you have with your health care provider. Document Released: 06/02/2008  Document Revised: 11/28/2018 Document Reviewed: 11/28/2018 Elsevier Patient Education  2020 [employer].

## 2019-11-05 ENCOUNTER — Ambulatory Visit (INDEPENDENT_AMBULATORY_CARE_PROVIDER_SITE_OTHER): Payer: Managed Care, Other (non HMO) | Admitting: Internal Medicine

## 2019-11-05 ENCOUNTER — Encounter: Payer: Self-pay | Admitting: Internal Medicine

## 2019-11-05 ENCOUNTER — Other Ambulatory Visit: Payer: Self-pay

## 2019-11-05 VITALS — BP 128/68 | HR 75 | Temp 98.1°F | Ht 68.0 in | Wt 168.4 lb

## 2019-11-05 DIAGNOSIS — E785 Hyperlipidemia, unspecified: Secondary | ICD-10-CM

## 2019-11-05 DIAGNOSIS — I10 Essential (primary) hypertension: Secondary | ICD-10-CM

## 2019-11-05 DIAGNOSIS — F908 Attention-deficit hyperactivity disorder, other type: Secondary | ICD-10-CM | POA: Diagnosis not present

## 2019-11-05 DIAGNOSIS — Z79899 Other long term (current) drug therapy: Secondary | ICD-10-CM

## 2019-11-05 DIAGNOSIS — Z0000 Encounter for general adult medical examination without abnormal findings: Secondary | ICD-10-CM

## 2019-11-22 ENCOUNTER — Other Ambulatory Visit: Payer: Self-pay | Admitting: Internal Medicine

## 2019-11-22 MED ORDER — AMPHETAMINE-DEXTROAMPHET ER 20 MG PO CP24: 20.0000 mg | ORAL_CAPSULE | Freq: Every day | ORAL | 0 refills | Status: DC

## 2019-11-22 NOTE — Telephone Encounter (Signed)
Last ov:11/05/2019 Last filled:10/21/2019

## 2019-11-22 NOTE — Telephone Encounter (Signed)
Medication Refill - Medication: amphetamine-dextroamphetamine (ADDERALL XR) 20 MG 24 hr capsule(Expired)  Has the patient contacted their pharmacy? No - only has two pills left (Agent: If no, request that the patient contact the pharmacy for the refill.) (Agent: If yes, when and what did the pharmacy advise?)  Preferred Pharmacy (with phone number or street name):  [id number] - [place], [place] [phone] (Phone) [phone] (Fax)   Agent: Please be advised that RX refills may take up to 3 business days. We ask that you follow-up with your pharmacy.

## 2019-11-22 NOTE — Telephone Encounter (Signed)
Requested medication (s) are due for refill today: yes  Requested medication (s) are on the active medication list: yes  Last refill:  10/21/2019  Future visit scheduled: no  Notes to clinic:  Refill cannot be delegated    Requested Prescriptions  Pending Prescriptions Disp Refills   amphetamine-dextroamphetamine (ADDERALL XR) 20 MG 24 hr capsule 30 capsule 0    Sig: Take 1 capsule (20 mg total) by mouth daily.     Not Delegated - Psychiatry:  Stimulants/ADHD Failed - 11/22/2019 10:21 AM      Failed - This refill cannot be delegated      Failed - Urine Drug Screen completed in last 360 days.      Passed - Valid encounter within last 3 months    Recent Outpatient Visits          2 weeks ago Visit for preventive health examination   [facility], Standley Brooking, MD   1 month ago Neoplasm of uncertain behavior of skin   [facility], Standley Brooking, MD   8 months ago Essential hypertension   [facility], Standley Brooking, MD   1 year ago Essential hypertension   [facility], Standley Brooking, MD   2 years ago Visit for preventive health examination   [place] [facility], Standley Brooking, MD

## 2019-12-23 ENCOUNTER — Other Ambulatory Visit: Payer: Self-pay | Admitting: Internal Medicine

## 2019-12-23 NOTE — Telephone Encounter (Signed)
See note

## 2019-12-23 NOTE — Telephone Encounter (Signed)
Last ov:11/05/2019 Last filled:11/22/2019

## 2019-12-23 NOTE — Telephone Encounter (Signed)
Requested medication (s) are due for refill today: yes  Requested medication (s) are on the active medication list: yes  Last refill:  11/22/2019  Future visit scheduled: no  Notes to clinic: This refill cannot be delegated    Requested Prescriptions  Pending Prescriptions Disp Refills   amphetamine-dextroamphetamine (ADDERALL XR) 20 MG 24 hr capsule 30 capsule 0    Sig: Take 1 capsule (20 mg total) by mouth daily.      Not Delegated - Psychiatry:  Stimulants/ADHD Failed - 12/23/2019 12:19 PM      Failed - This refill cannot be delegated      Failed - Urine Drug Screen completed in last 360 days.      Passed - Valid encounter within last 3 months    Recent Outpatient Visits           1 month ago Visit for preventive health examination   [facility] [place], [place]   3 months ago Neoplasm of uncertain behavior of skin   [facility], Standley Brooking, MD   9 months ago Essential hypertension   [facility], Standley Brooking, MD   1 year ago Essential hypertension   [facility], Standley Brooking, MD   2 years ago Visit for preventive health examination   [facility], Standley Brooking, MD

## 2019-12-23 NOTE — Telephone Encounter (Signed)
Copied from [place] [phone] [phone]. Topic: Quick Communication - Rx Refill/Question >> Dec 23, 2019 12:15 PM Leward Quan A wrote: Medication: amphetamine-dextroamphetamine (ADDERALL XR) 20 MG 24 hr capsule   Has the patient contacted their pharmacy? Yes.   (Agent: If no, request that the patient contact the pharmacy for the refill.) (Agent: If yes, when and what did the pharmacy advise?)  Preferred Pharmacy (with phone number or street name): [id number] [place], [place]  Phone:  [phone] Fax:  [phone]     Agent: Please be advised that RX refills may take up to 3 business days. We ask that you follow-up with your pharmacy.

## 2019-12-24 MED ORDER — AMPHETAMINE-DEXTROAMPHET ER 20 MG PO CP24: 20.0000 mg | ORAL_CAPSULE | Freq: Every day | ORAL | 0 refills | Status: DC

## 2020-01-28 ENCOUNTER — Other Ambulatory Visit: Payer: Self-pay | Admitting: Internal Medicine

## 2020-01-28 MED ORDER — AMPHETAMINE-DEXTROAMPHET ER 20 MG PO CP24: 20.0000 mg | ORAL_CAPSULE | Freq: Every day | ORAL | 0 refills | Status: DC

## 2020-01-28 NOTE — Telephone Encounter (Signed)
Last ov:11/05/2019 Last filled:12/24/2019

## 2020-01-28 NOTE — Telephone Encounter (Signed)
Medication Refill: Adderall  Pharmacy: [facility] Fax: [phone]

## 2020-02-25 ENCOUNTER — Other Ambulatory Visit: Payer: Self-pay | Admitting: Internal Medicine

## 2020-02-25 MED ORDER — AMPHETAMINE-DEXTROAMPHET ER 20 MG PO CP24: 20.0000 mg | ORAL_CAPSULE | Freq: Every day | ORAL | 0 refills | Status: DC

## 2020-02-25 NOTE — Addendum Note (Signed)
Addended byShanon Ace K on: 02/25/2020 12:23 PM   Modules accepted: Orders

## 2020-02-25 NOTE — Telephone Encounter (Signed)
Sent in electronically .  

## 2020-02-25 NOTE — Addendum Note (Signed)
Addended by: Modena Morrow R on: 02/25/2020 11:25 AM   Modules accepted: Orders

## 2020-02-25 NOTE — Telephone Encounter (Signed)
Last ov:11/05/2019 Last filled:01/28/20

## 2020-02-25 NOTE — Telephone Encounter (Signed)
Medication Refill: Adderall  [facility] [place]  Phone:

## 2020-03-30 ENCOUNTER — Telehealth: Payer: Self-pay | Admitting: Internal Medicine

## 2020-03-30 MED ORDER — AMPHETAMINE-DEXTROAMPHET ER 20 MG PO CP24: 20.0000 mg | ORAL_CAPSULE | Freq: Every day | ORAL | 0 refills | Status: DC

## 2020-03-30 NOTE — Telephone Encounter (Signed)
Sent in electronically . Will be due for  6 months med  Check end of may or therabouts

## 2020-03-30 NOTE — Telephone Encounter (Signed)
Last OV 11/05/2019  Last filled 02/25/2020, # 30 with 0 refills

## 2020-03-30 NOTE — Telephone Encounter (Signed)
amphetamine-dextroamphetamine (ADDERALL XR) 20 MG 24 hr capsule(Expired)  [id number] [address] [place] Phone:  [phone]  Fax:  [phone]

## 2020-03-30 NOTE — Addendum Note (Signed)
Addended byShanon Ace K on: 03/30/2020 12:30 PM   Modules accepted: Orders

## 2020-04-27 ENCOUNTER — Other Ambulatory Visit: Payer: Self-pay

## 2020-04-27 ENCOUNTER — Telehealth: Payer: Self-pay | Admitting: Internal Medicine

## 2020-04-27 MED ORDER — AMLODIPINE BESYLATE 5 MG PO TABS: 5.0000 mg | ORAL_TABLET | Freq: Every day | ORAL | 0 refills | Status: DC

## 2020-04-27 MED ORDER — LOSARTAN POTASSIUM 50 MG PO TABS: 50.0000 mg | ORAL_TABLET | Freq: Every day | ORAL | 0 refills | Status: DC

## 2020-04-27 NOTE — Telephone Encounter (Signed)
Pt is calling in stating that he needs a refill on the following Rx's amlodipine 5 MG, ADDERALL XR 20 MG (only have 2 days worth left) and losartan 50 MG   [facility] [address]

## 2020-04-27 NOTE — Telephone Encounter (Signed)
Last OV 11/05/2019  Last filled 03/30/2020, # 30 with 0 refills.  I have sent the other medications with a message to please schedule follow up by end of May 2021.

## 2020-04-28 MED ORDER — AMPHETAMINE-DEXTROAMPHET ER 20 MG PO CP24: 20.0000 mg | ORAL_CAPSULE | Freq: Every day | ORAL | 0 refills | Status: DC

## 2020-04-28 NOTE — Telephone Encounter (Signed)
Sent in electronically .  

## 2020-04-29 ENCOUNTER — Other Ambulatory Visit: Payer: Self-pay

## 2020-04-29 ENCOUNTER — Ambulatory Visit (INDEPENDENT_AMBULATORY_CARE_PROVIDER_SITE_OTHER): Payer: 59 | Admitting: Internal Medicine

## 2020-04-29 ENCOUNTER — Encounter: Payer: Self-pay | Admitting: Internal Medicine

## 2020-04-29 VITALS — BP 134/82 | HR 82 | Temp 97.9°F | Ht 68.0 in | Wt 170.0 lb

## 2020-04-29 DIAGNOSIS — Z79899 Other long term (current) drug therapy: Secondary | ICD-10-CM

## 2020-04-29 DIAGNOSIS — F908 Attention-deficit hyperactivity disorder, other type: Secondary | ICD-10-CM | POA: Diagnosis not present

## 2020-04-29 DIAGNOSIS — Z125 Encounter for screening for malignant neoplasm of prostate: Secondary | ICD-10-CM

## 2020-04-29 DIAGNOSIS — I10 Essential (primary) hypertension: Secondary | ICD-10-CM

## 2020-04-29 DIAGNOSIS — Z299 Encounter for prophylactic measures, unspecified: Secondary | ICD-10-CM

## 2020-04-29 MED ORDER — AMPHETAMINE-DEXTROAMPHET ER 20 MG PO CP24: 20.0000 mg | ORAL_CAPSULE | Freq: Every day | ORAL | 0 refills | Status: DC

## 2020-04-29 MED ORDER — AMLODIPINE BESYLATE 5 MG PO TABS: 5.0000 mg | ORAL_TABLET | Freq: Every day | ORAL | 1 refills | Status: DC

## 2020-04-29 NOTE — Progress Notes (Signed)
Chief Complaint  Patient presents with  . Medication Refill    Doing okay    HPI: Barry Silva 59 y.o. come in for Chronic disease management  Med evaluation ADHD  Med still helps and takes almost every day  Feeling helpful  HT:    After exercise    125 /80 taking meds  No td less  3 x per week . Legs :  Recovering  Personal trainer 3 x per week   No new injuries  Did do mountain bike and got anterior abrasions  NO sx  Of sarcoid in past  Had a cough that went away   No sx Saw  Sood in 2021  Ok x ray   ? If really has sleep apnea  Always had issues falling asleep and no hx of spring or stop breathing  Over due for colon  Delayed cause of covid shut down  Is immunized  Basal cell face resolved no new lesions  ROS: See pertinent positives and negatives per HPI.  Past Medical History:  Diagnosis Date  . Abnormal LFTs 12/01/2014   neg serologies avoid reg liver toxins and repeat in 2 months consider abd [place]  other labs if needed   . ADD (attention deficit disorder with hyperactivity)    formal evaluation  . Basal cell carcinoma (BCC) 10/2019   nasal bridge  . Complication of anesthesia    so cold - was worse than pain from injury- 10/26/18- this was not the an issue  . 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 History of kidney stones   . Hypertension   . Migraine   . MIGRAINE HEADACHE 08/30/2007   Qualifier: Diagnosis of  By: Scherrie Gerlach    . Nephrolithiasis    hx of episode 1/10 calcium stones  . OSA (obstructive sleep apnea)    mild, Apnealink 7 2011 RDI7, O2 nadir 83%  . Refusal of blood transfusions as patient is Jehovah's Witness   . Sarcoidosis    bronchoscopy 2006, mtx started 04/19/10 try off 11/29/10 (atypical cp/ ? worsening ct chest wl[facility]) off prednisone 7/11    Family History  Problem Relation Age of Onset  . Stroke Other        Grandfather father older age  . Colon cancer Neg Hx   . Esophageal cancer Neg Hx   . Rectal cancer Neg Hx   .  Stomach cancer Neg Hx     Social History   Socioeconomic History  . Marital status: Married    Spouse name: Not on file  . Number of children: Not on file  . Years of education: Not on file  . Highest education level: Not on file  Occupational History  . Not on file  Tobacco Use  . Smoking status: Never Smoker  . Smokeless tobacco: Never Used  Substance and Sexual Activity  . Alcohol use: Yes    Alcohol/week: 6.0 standard drinks    Types: 6 Glasses of wine per week    Comment: socially- 11/01/18- none this week  . Drug use: No  . Sexual activity: Not on file  Other Topics Concern  . Not on file  Social History Narrative   Married   [place] of 2-3   Works  owns [employer]  Active physically .   Family in [place]   No tobacco or ETS   Social Determinants of Health   Financial Resource Strain:   . Difficulty  of Paying Living Expenses:   Food Insecurity:   . Worried About Charity fundraiser in the Last Year:   . Arboriculturist in the Last Year:   Transportation Needs:   . Film/video editor (Medical):   [name] Lack of Transportation (Non-Medical):   Physical Activity:   . Days of Exercise per Week:   . Minutes of Exercise per Session:   Stress:   . Feeling of Stress :   Social Connections:   . Frequency of Communication with Friends and Family:   . Frequency of Social Gatherings with Friends and Family:   . Attends Religious Services:   . Active Member of Clubs or Organizations:   . Attends Archivist Meetings:   [name] Marital Status:     Outpatient Medications Prior to Visit  Medication Sig Dispense Refill  . CANNABIDIOL PO Take 50 mLs by mouth daily.    [name] losartan (COZAAR) 50 MG tablet Take 1 tablet (50 mg total) by mouth daily. Please schedule your 6 month follow up at the end of May 2021. [phone] 90 tablet 0  . vitamin C (ASCORBIC ACID) 500 MG tablet Take 500 mg by mouth daily.    [name] amLODipine (NORVASC) 5 MG tablet Take 1 tablet (5 mg total) by  mouth daily. Please schedule your 6 month follow up at the end of May 2021. [phone] 90 tablet 0  . amphetamine-dextroamphetamine (ADDERALL XR) 20 MG 24 hr capsule Take 1 capsule (20 mg total) by mouth daily. 30 capsule 0   No facility-administered medications prior to visit.     EXAM:  BP 134/82   Pulse 82   Temp 97.9 F (36.6 C) (Temporal)   Ht 5\' 8"  (1.727 m)   Wt 170 lb (77.1 kg)   SpO2 98%   BMI 25.85 kg/m   Body mass index is 25.85 kg/m.  GENERAL: vitals reviewed and listed above, alert, oriented, appears well hydrated and in no acute distress HEENT: atraumatic, conjunctiva  clear, no obvious abnormalities on inspection of external nose and ears OP : masked   NECK: no obvious masses on inspection palpation  LUNGS: clear to auscultation bilaterally, no wheezes, rales or rhonchi, good air movement CV: HRRR, no clubbing cyanosis or  peripheral edema nl cap refill  MS: moves all extremities without noticeable focal  Abnormality healing abrasion left ant shin  PSYCH: pleasant and cooperative, no obvious depression or anxiety Lab Results  Component Value Date   WBC 6.0 10/21/2019   HGB 16.3 10/21/2019   HCT 46.7 10/21/2019   PLT 281.0 10/21/2019   GLUCOSE 88 10/21/2019   CHOL 220 (H) 10/21/2019   TRIG 143.0 10/21/2019   HDL 44.10 10/21/2019   LDLDIRECT 142.6 05/14/2013   LDLCALC 147 (H) 10/21/2019   ALT 23 10/21/2019   AST 22 10/21/2019   NA 139 10/21/2019   K 3.8 10/21/2019   CL 102 10/21/2019   CREATININE 0.92 10/21/2019   BUN 15 10/21/2019   CO2 30 10/21/2019   TSH 1.45 10/21/2019   PSA 1.04 10/21/2019   INR 0.96 11/02/2018   HGBA1C 5.1 10/27/2017   BP Readings from Last 3 Encounters:  04/29/20 134/82  11/05/19 128/68  09/24/19 136/88    ASSESSMENT AND PLAN:  Discussed the following assessment and plan:  Attention deficit hyperactivity disorder (ADHD), other type - Plan: Basic metabolic panel, CBC with Differential/Platelet, Hepatic function  panel, Lipid panel  Medication management - Plan: Basic metabolic panel, CBC with Differential/Platelet,  Hepatic function panel, Lipid panel  Essential hypertension - Plan: Basic metabolic panel, CBC with Differential/Platelet, Hepatic function panel, Lipid panel  Preventive measure - Plan: Basic metabolic panel, CBC with Differential/Platelet, Hepatic function panel, Lipid panel  Screening PSA (prostate specific antigen) - Plan: PSA Benefit more than risk of medications  to continue.  At thist time disc BP goals   Get you colon cancer screening   Plan lipid check in fall  -Patient advised to return or notify health care team  if  new concerns arise.  Patient Instructions   Continue lifestyle intervention healthy eating and exercise .  bp control Will refill medicaiton.   plan fasting lab  In fall and then cpx     Mariann Laster K. Arch Methot M.D.

## 2020-04-29 NOTE — Patient Instructions (Signed)
  Continue lifestyle intervention healthy eating and exercise .  bp control Will refill medicaiton.   plan fasting lab  In fall and then cpx

## 2020-05-28 ENCOUNTER — Telehealth: Payer: Self-pay | Admitting: Internal Medicine

## 2020-05-28 NOTE — Telephone Encounter (Signed)
Last OV 04/29/2020  Last filled 04/29/2020, # 30 with 0 refills

## 2020-05-28 NOTE — Telephone Encounter (Signed)
Pt is calling in needing a refill amphetamine-dextroamphetamine (ADDERALL XR) 20 MG   [facility] [address]

## 2020-05-29 MED ORDER — AMPHETAMINE-DEXTROAMPHET ER 20 MG PO CP24: 20.0000 mg | ORAL_CAPSULE | Freq: Every day | ORAL | 0 refills | Status: DC

## 2020-05-29 NOTE — Addendum Note (Signed)
Addended byShanon Ace K on: 05/29/2020 10:14 AM   Modules accepted: Orders

## 2020-06-26 ENCOUNTER — Telehealth: Payer: Self-pay | Admitting: Internal Medicine

## 2020-06-26 MED ORDER — AMPHETAMINE-DEXTROAMPHET ER 20 MG PO CP24: 20.0000 mg | ORAL_CAPSULE | Freq: Every day | ORAL | 0 refills | Status: DC

## 2020-06-26 NOTE — Addendum Note (Signed)
Addended byShanon Ace K on: 06/26/2020 10:35 AM   Modules accepted: Orders

## 2020-06-26 NOTE — Telephone Encounter (Signed)
Pt is requesting a refill on Adderall XR 20 mg. Pt [facility] [place]

## 2020-06-26 NOTE — Telephone Encounter (Signed)
Last OV 04/29/2020  Last filled 05/29/2020, # 30 with 0 refills

## 2020-07-27 ENCOUNTER — Telehealth: Payer: Self-pay | Admitting: Internal Medicine

## 2020-07-27 MED ORDER — AMPHETAMINE-DEXTROAMPHET ER 20 MG PO CP24: 20.0000 mg | ORAL_CAPSULE | Freq: Every day | ORAL | 0 refills | Status: DC

## 2020-07-27 NOTE — Telephone Encounter (Signed)
Pt is calling in stating that he needs a refill on his ADDERALL XR  20 MG  [facility] [address]

## 2020-07-27 NOTE — Telephone Encounter (Signed)
Sent in electronically .  

## 2020-07-27 NOTE — Telephone Encounter (Signed)
Last OV 04/29/2020  Last filled 06/26/2020, # 30 with 0 refills

## 2020-08-31 ENCOUNTER — Telehealth: Payer: Self-pay | Admitting: Internal Medicine

## 2020-08-31 MED ORDER — AMPHETAMINE-DEXTROAMPHET ER 20 MG PO CP24: 20.0000 mg | ORAL_CAPSULE | Freq: Every day | ORAL | 0 refills | Status: DC

## 2020-08-31 NOTE — Telephone Encounter (Signed)
Pt call and stated he need a refill on his amphetamine-dextroamphetamine (ADDERALL XR) 20 MG 24 hr capsule [facility]  [id number] [address], [place] Phone:  [phone]  Fax:  [phone]

## 2020-08-31 NOTE — Telephone Encounter (Signed)
Last OV 04/29/2020  Last filled 07/27/2020, # 30 with 0 refills

## 2020-09-30 ENCOUNTER — Other Ambulatory Visit: Payer: Self-pay | Admitting: Internal Medicine

## 2020-09-30 MED ORDER — AMPHETAMINE-DEXTROAMPHET ER 20 MG PO CP24: 20.0000 mg | ORAL_CAPSULE | Freq: Every day | ORAL | 0 refills | Status: DC

## 2020-09-30 NOTE — Telephone Encounter (Signed)
Pt call and want a refill on amphetamine-dextroamphetamine (ADDERALL XR) 20 MG 24 hr capsule [facility]  [id number] [address], [place] Phone:  [phone]  Fax:  [phone]

## 2020-10-29 NOTE — Progress Notes (Signed)
Chief Complaint  Patient presents with  . Follow-up  . Annual Exam  . Medication Management    HPI: Patient  Barry Silva  59 y.o. comes in today for Preventive Health Care visit  And med check  Doing well  exerciseing  Still has dec dorsiflexion left ankle but working with trainer and back to [facility] .  resp   No active sx  Hernia   Surgery schedule  For dec 3   Skin  No change   Has derm  Adhd :   Has 2    left still seem to help  For attention and mental energy tasks . Had covid vaccine  Bp  Doing ok  Taking meds no se reported  Going to gym reg  Takes cbd at night  For sleep etc in past conintuing  Health Maintenance  Topic Date Due  . COVID-19 Vaccine (1) Never done  . HIV Screening  Never done  . COLONOSCOPY  05/09/2019  . INFLUENZA VACCINE  Never done  . TETANUS/TDAP  10/17/2027  . Hepatitis C Screening  Completed   Health Maintenance Review LIFESTYLE:  Exercise:   Gym and trainer    mountain bike   Improving post injury  Tobacco/ETS:  no Alcohol:   ocass Sugar beverages:    Rare   oj  Sleep:  5-7  Drug use: no HH of   3   2 cats  Work:  About 30 - 40  ROS:  GEN/ HEENT: No fever, significant weight changes sweats headaches vision problems hearing changes, CV/ PULM; No chest pain shortness of breath cough, syncope,edema  change in exercise tolerance. GI /GU: No adominal pain, vomiting, change in bowel habits. No blood in the stool. No significant GU symptoms. SKIN/HEME: ,no acute skin rashes suspicious lesions or bleeding. No lymphadenopathy, nodules, masses.  NEURO/ PSYCH:  No neurologic signs such as weakness numbness. No depression anxiety. IMM/ Allergy: No unusual infections.  Allergy .   REST of 12 system review negative except as per HPI   Past Medical History:  Diagnosis Date  . Abnormal LFTs 12/01/2014   neg serologies avoid reg liver toxins and repeat in 2 months consider abd [place]  other labs if needed   . ADD (attention deficit disorder  with hyperactivity)    formal evaluation  . Basal cell carcinoma (BCC) 10/2019   nasal bridge  . Complication of anesthesia    so cold - was worse than pain from injury- 10/26/18- this was not the an issue  . 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 History of kidney stones   . Hypertension   . Migraine   . MIGRAINE HEADACHE 08/30/2007   Qualifier: Diagnosis of  By: Scherrie Gerlach    . Nephrolithiasis    hx of episode 1/10 calcium stones  . OSA (obstructive sleep apnea)    mild, Apnealink 7 2011 RDI7, O2 nadir 83%  . Refusal of blood transfusions as patient is Jehovah's Witness   . Sarcoidosis    bronchoscopy 2006, mtx started 04/19/10 try off 11/29/10 (atypical cp/ ? worsening ct chest [facility]) off prednisone 7/11    Past Surgical History:  Procedure Laterality Date  . BRONCHOSCOPY  2006  . COLONOSCOPY    . EXTERNAL FIXATION LEG Bilateral 10/26/2018   Procedure: LEFT LEGEXTERNAL FIXATION .Marland KitchenSPLINT CHANGE RIGHT LEG;  Surgeon: Shona Needles, MD;  Location: [facility];  Service: Orthopedics;  Laterality: Bilateral;  .  EXTERNAL FIXATION REMOVAL  11/02/2018   Procedure: REMOVAL EXTERNAL FIXATION LEG;  Surgeon: Shona Needles, MD;  Location: [facility];  Service: Orthopedics;;  . HARDWARE REMOVAL Right 06/04/2019   Procedure: RIGHT FOOT HARDWARE REMOVAL;  Surgeon: Erle Crocker, MD;  Location: [facility];  Service: Orthopedics;  Laterality: Right;  . HERNIA REPAIR     age 67  . KIDNEY STONE SURGERY     cystocopy  . [place]   getting knee to bend- had a previous fracture  . OPEN REDUCTION INTERNAL FIXATION (ORIF) TIBIA/FIBULA FRACTURE Left 11/02/2018   Procedure: OPEN REDUCTION INTERNAL FIXATION (ORIF) LEFT PILON FRACTURE;  Surgeon: Shona Needles, MD;  Location: [facility];  Service: Orthopedics;  Laterality: Left;  . ORIF CALCANEOUS FRACTURE Right 11/02/2018   Procedure: OPEN REDUCTION INTERNAL FIXATION (ORIF) CALCANEOUS FRACTURE;  Surgeon:  Shona Needles, MD;  Location: [facility];  Service: Orthopedics;  Laterality: Right;  . TENDON EXPLORATION Right 06/04/2019   Procedure: EXPLORATION AND REPAIR  PERONEAL TENDONS;  Surgeon: Erle Crocker, MD;  Location: [facility];  Service: Orthopedics;  Laterality: Right;    Family History  Problem Relation Age of Onset  . Stroke Other        Grandfather father older age  . Colon cancer Neg Hx   . Esophageal cancer Neg Hx   . Rectal cancer Neg Hx   . Stomach cancer Neg Hx     Social History   Socioeconomic History  . Marital status: Married    Spouse name: Not on file  . Number of children: Not on file  . Years of education: Not on file  . Highest education level: Not on file  Occupational History  . Not on file  Tobacco Use  . Smoking status: Never Smoker  . Smokeless tobacco: Never Used  Vaping Use  . Vaping Use: Never used  Substance and Sexual Activity  . Alcohol use: Yes    Alcohol/week: 6.0 standard drinks    Types: 6 Glasses of wine per week    Comment: socially- 11/01/18- none this week  . Drug use: No  . Sexual activity: Not on file  Other Topics Concern  . Not on file  Social History Narrative   Married   [place] of 2-3   Works  owns [employer]  Active physically .   Family in [place]   No tobacco or ETS   Social Determinants of Health   Financial Resource Strain:   . Difficulty of Paying Living Expenses: Not on file  Food Insecurity:   . Worried About Charity fundraiser in the Last Year: Not on file  . Ran Out of Food in the Last Year: Not on file  Transportation Needs:   . Lack of Transportation (Medical): Not on file  . Lack of Transportation (Non-Medical): Not on file  Physical Activity:   . Days of Exercise per Week: Not on file  . Minutes of Exercise per Session: Not on file  Stress:   . Feeling of Stress : Not on file  Social Connections:   . Frequency of Communication with Friends and Family: Not on file  . Frequency of  Social Gatherings with Friends and Family: Not on file  . Attends Religious Services: Not on file  . Active Member of Clubs or Organizations: Not on file  . Attends Archivist Meetings: Not on file  . Marital Status: Not on file  Outpatient Medications Prior to Visit  Medication Sig Dispense Refill  . amLODipine (NORVASC) 5 MG tablet Take 1 tablet (5 mg total) by mouth daily. 90 tablet 1  . CANNABIDIOL PO Take 50 mLs by mouth daily.    [name] losartan (COZAAR) 50 MG tablet Take 1 tablet (50 mg total) by mouth daily. Please schedule your 6 month follow up at the end of May 2021. [phone] 90 tablet 0  . vitamin C (ASCORBIC ACID) 500 MG tablet Take 500 mg by mouth daily.    [name] amphetamine-dextroamphetamine (ADDERALL XR) 20 MG 24 hr capsule Take 1 capsule (20 mg total) by mouth daily. 30 capsule 0   No facility-administered medications prior to visit.     EXAM:  BP 130/82   Pulse 96   Temp 99 F (37.2 C) (Oral)   Ht _0  (1.727 m)   Wt 170 lb (77.1 kg)   SpO2 99%   BMI 25.85 kg/m   Body mass index is 25.85 kg/m. Wt Readings from Last 3 Encounters:  10/30/20 170 lb (77.1 kg)  04/29/20 170 lb (77.1 kg)  11/05/19 168 lb 6.4 oz (76.4 kg)    Physical Exam: Vital signs reviewed [id number] is a well-developed well-nourished alert cooperative    who appearsr stated age in no acute distress.  HEENT: normocephalic atraumatic , Eyes: PERRL EOM's full, conjunctiva clear, Nares: paten,t no deformity discharge or tenderness., Ears: no deformity EAC's clear TMs with normal landmarks. Mouth: masked NECK: supple without masses, thyromegaly or bruits. CHEST/PULM:  Clear to auscultation and percussion breath sounds equal no wheeze , rales or rhonchi. No chest wall deformities or tenderness.. CV: PMI is nondisplaced, S1 S2 no gallops, murmurs, rubs. Peripheral pulses are full without delay.No JVD .  ABDOMEN: Bowel sounds normal nontender  No guard or rebound, no hepato splenomegal no  CVA tenderness.  Hernia no left not examined . Extremtities:  No clubbing cyanosis or edema, no acute joint swelling or redness no focal atrophy left foto ankle looks symmetrical  Dec dorsiflexion but nl circulation NEURO:  Oriented x3, cranial nerves 3-12 appear to be intact, no obvious focal weakness,gait within normal limits  SKIN: No acute rashes normal turgor, color, no bruising or petechiae. PSYCH: Oriented, good eye contact, no obvious depression anxiety, cognition and judgment appear normal. LN: no cervical axillary inguinal adenopathy  Lab Results  Component Value Date   WBC 5.8 10/30/2020   HGB 15.7 10/30/2020   HCT 46.3 10/30/2020   PLT 295 10/30/2020   GLUCOSE 89 10/30/2020   CHOL 215 (H) 10/30/2020   TRIG 159 (H) 10/30/2020   HDL 53 10/30/2020   LDLDIRECT 142.6 05/14/2013   LDLCALC 133 (H) 10/30/2020   ALT 25 10/30/2020   AST 22 10/30/2020   NA 140 10/30/2020   K 4.5 10/30/2020   CL 105 10/30/2020   CREATININE 0.89 10/30/2020   BUN 14 10/30/2020   CO2 27 10/30/2020   TSH 1.45 10/21/2019   PSA 0.80 10/30/2020   INR 0.96 11/02/2018   HGBA1C 5.1 10/27/2017  ate    After exercise  am breakfast  Burrito  And 2 muffin  Pre exercise    BP Readings from Last 3 Encounters:  10/30/20 130/82  04/29/20 134/82  11/05/19 128/68    Lab plan  reviewed with patient   ASSESSMENT AND PLAN:  Discussed the following assessment and plan:    ICD-10-CM   1. Visit for preventive health examination  Z00.00 CBC with Differential/Platelet  BASIC METABOLIC PANEL WITH GFR    Lipid panel    Hepatic function panel    PSA    PSA    Hepatic function panel    Lipid panel    BASIC METABOLIC PANEL WITH GFR    CBC with Differential/Platelet  2. Medication management  Z79.899 CBC with Differential/Platelet    BASIC METABOLIC PANEL WITH GFR    Lipid panel    Hepatic function panel    PSA    PSA    Hepatic function panel    Lipid panel    BASIC METABOLIC PANEL WITH GFR    CBC  with Differential/Platelet  3. Essential hypertension  I10 CBC with Differential/Platelet    BASIC METABOLIC PANEL WITH GFR    Lipid panel    Hepatic function panel    PSA    PSA    Hepatic function panel    Lipid panel    BASIC METABOLIC PANEL WITH GFR    CBC with Differential/Platelet  4. Attention deficit hyperactivity disorder (ADHD), other type  F90.8 CBC with Differential/Platelet    BASIC METABOLIC PANEL WITH GFR    Lipid panel    Hepatic function panel    PSA    PSA    Hepatic function panel    Lipid panel    BASIC METABOLIC PANEL WITH GFR    CBC with Differential/Platelet  5. Screening PSA (prostate specific antigen)  Z12.5 PSA    PSA  6. Influenza vaccination declined by patient  Z28.21      Colon  Has cologuard  Kit needs to send in  Declined  flu    caution with supplements  okto continue same meds,  Lab monitor today  NF  Return in about 6 months (around 04/29/2021) for medication.  Patient Care Team: Rhemi Balbach, Standley Brooking, MD as PCP - General Chesley Mires, MD (Pulmonary Disease) Patient Instructions    Continue lifestyle intervention healthy eating and exercise .   Will notify you  of labs when available.   Non fasting sample.  Will refill adderall xr  Do the cologuard colonscreen.    Health Maintenance, Male Adopting a healthy lifestyle and getting preventive care are important in promoting health and wellness. Ask your health care provider about:  The right schedule for you to have regular tests and exams.  Things you can do on your own to prevent diseases and keep yourself healthy. What should I know about diet, weight, and exercise? Eat a healthy diet   Eat a diet that includes plenty of vegetables, fruits, low-fat dairy products, and lean protein.  Do not eat a lot of foods that are high in solid fats, added sugars, or sodium. Maintain a healthy weight Body mass index (BMI) is a measurement that can be used to identify possible weight  problems. It estimates body fat based on height and weight. Your health care provider can help determine your BMI and help you achieve or maintain a healthy weight. Get regular exercise Get regular exercise. This is one of the most important things you can do for your health. Most adults should:  Exercise for at least 150 minutes each week. The exercise should increase your heart rate and make you sweat (moderate-intensity exercise).  Do strengthening exercises at least twice a week. This is in addition to the moderate-intensity exercise.  Spend less time sitting. Even light physical activity can be beneficial. Watch cholesterol and blood lipids Have your blood tested for lipids and cholesterol at  57 years of age, then have this test every 5 years. You may need to have your cholesterol levels checked more often if:  Your lipid or cholesterol levels are high.  You are older than 57 years of age.  You are at high risk for heart disease. What should I know about cancer screening? Many types of cancers can be detected early and may often be prevented. Depending on your health history and family history, you may need to have cancer screening at various ages. This may include screening for:  Colorectal cancer.  Prostate cancer.  Skin cancer.  Lung cancer. What should I know about heart disease, diabetes, and high blood pressure? Blood pressure and heart disease  High blood pressure causes heart disease and increases the risk of stroke. This is more likely to develop in people who have high blood pressure readings, are of African descent, or are overweight.  Talk with your health care provider about your target blood pressure readings.  Have your blood pressure checked: ? Every 3-5 years if you are 8-50 years of age. ? Every year if you are 54 years old or older.  If you are between the ages of 47 and 24 and are a current or former smoker, ask your health care provider if you should  have a one-time screening for abdominal aortic aneurysm (AAA). Diabetes Have regular diabetes screenings. This checks your fasting blood sugar level. Have the screening done:  Once every three years after age 87 if you are at a normal weight and have a low risk for diabetes.  More often and at a younger age if you are overweight or have a high risk for diabetes. What should I know about preventing infection? Hepatitis B If you have a higher risk for hepatitis B, you should be screened for this virus. Talk with your health care provider to find out if you are at risk for hepatitis B infection. Hepatitis C Blood testing is recommended for:  Everyone born from [birth year] through 1965.  Anyone with known risk factors for hepatitis C. Sexually transmitted infections (STIs)  You should be screened each year for STIs, including gonorrhea and chlamydia, if: ? You are sexually active and are younger than 57 years of age. ? You are older than 57 years of age and your health care provider tells you that you are at risk for this type of infection. ? Your sexual activity has changed since you were last screened, and you are at increased risk for chlamydia or gonorrhea. Ask your health care provider if you are at risk.  Ask your health care provider about whether you are at high risk for HIV. Your health care provider may recommend a prescription medicine to help prevent HIV infection. If you choose to take medicine to prevent HIV, you should first get tested for HIV. You should then be tested every 3 months for as long as you are taking the medicine. Follow these instructions at home: Lifestyle  Do not use any products that contain nicotine or tobacco, such as cigarettes, e-cigarettes, and chewing tobacco. If you need help quitting, ask your health care provider.  Do not use street drugs.  Do not share needles.  Ask your health care provider for help if you need support or information about quitting  drugs. Alcohol use  Do not drink alcohol if your health care provider tells you not to drink.  If you drink alcohol: ? Limit how much you have to 0-2 drinks a  day. ? Be aware of how much alcohol is in your drink. In the [place] one drink equals one 12 oz bottle of beer (355 mL), one 5 oz glass of wine (148 mL), or one 1 oz glass of hard liquor (44 mL). General instructions  Schedule regular health, dental, and eye exams.  Stay current with your vaccines.  Tell your health care provider if: ? You often feel depressed. ? You have ever been abused or do not feel safe at home. Summary  Adopting a healthy lifestyle and getting preventive care are important in promoting health and wellness.  Follow your health care provider's instructions about healthy diet, exercising, and getting tested or screened for diseases.  Follow your health care provider's instructions on monitoring your cholesterol and blood pressure. This information is not intended to replace advice given to you by your health care provider. Make sure you discuss any questions you have with your health care provider. Document Revised: 11/28/2018 Document Reviewed: 11/28/2018 Elsevier Patient Education  2020 [place] Bjorn Hallas M.D.

## 2020-10-30 ENCOUNTER — Encounter: Payer: Self-pay | Admitting: Internal Medicine

## 2020-10-30 ENCOUNTER — Ambulatory Visit (INDEPENDENT_AMBULATORY_CARE_PROVIDER_SITE_OTHER): Payer: 59 | Admitting: Internal Medicine

## 2020-10-30 ENCOUNTER — Other Ambulatory Visit: Payer: Self-pay

## 2020-10-30 ENCOUNTER — Ambulatory Visit: Payer: Self-pay | Admitting: General Surgery

## 2020-10-30 VITALS — BP 130/82 | HR 96 | Temp 99.0°F | Ht 68.0 in | Wt 170.0 lb

## 2020-10-30 DIAGNOSIS — Z2821 Immunization not carried out because of patient refusal: Secondary | ICD-10-CM | POA: Diagnosis not present

## 2020-10-30 DIAGNOSIS — Z0000 Encounter for general adult medical examination without abnormal findings: Secondary | ICD-10-CM

## 2020-10-30 DIAGNOSIS — Z79899 Other long term (current) drug therapy: Secondary | ICD-10-CM | POA: Diagnosis not present

## 2020-10-30 DIAGNOSIS — I10 Essential (primary) hypertension: Secondary | ICD-10-CM | POA: Diagnosis not present

## 2020-10-30 DIAGNOSIS — F908 Attention-deficit hyperactivity disorder, other type: Secondary | ICD-10-CM | POA: Diagnosis not present

## 2020-10-30 DIAGNOSIS — Z125 Encounter for screening for malignant neoplasm of prostate: Secondary | ICD-10-CM | POA: Diagnosis not present

## 2020-10-30 DIAGNOSIS — E7800 Pure hypercholesterolemia, unspecified: Secondary | ICD-10-CM

## 2020-10-30 MED ORDER — AMPHETAMINE-DEXTROAMPHET ER 20 MG PO CP24: 20.0000 mg | ORAL_CAPSULE | Freq: Every day | ORAL | 0 refills | Status: DC

## 2020-10-30 NOTE — Patient Instructions (Signed)
Continue lifestyle intervention healthy eating and exercise .   Will notify you  of labs when available.   Non fasting sample.  Will refill adderall xr  Do the cologuard colonscreen.    Health Maintenance, Male Adopting a healthy lifestyle and getting preventive care are important in promoting health and wellness. Ask your health care provider about:  The right schedule for you to have regular tests and exams.  Things you can do on your own to prevent diseases and keep yourself healthy. What should I know about diet, weight, and exercise? Eat a healthy diet   Eat a diet that includes plenty of vegetables, fruits, low-fat dairy products, and lean protein.  Do not eat a lot of foods that are high in solid fats, added sugars, or sodium. Maintain a healthy weight Body mass index (BMI) is a measurement that can be used to identify possible weight problems. It estimates body fat based on height and weight. Your health care provider can help determine your BMI and help you achieve or maintain a healthy weight. Get regular exercise Get regular exercise. This is one of the most important things you can do for your health. Most adults should:  Exercise for at least 150 minutes each week. The exercise should increase your heart rate and make you sweat (moderate-intensity exercise).  Do strengthening exercises at least twice a week. This is in addition to the moderate-intensity exercise.  Spend less time sitting. Even light physical activity can be beneficial. Watch cholesterol and blood lipids Have your blood tested for lipids and cholesterol at 57 years of age, then have this test every 5 years. You may need to have your cholesterol levels checked more often if:  Your lipid or cholesterol levels are high.  You are older than 57 years of age.  You are at high risk for heart disease. What should I know about cancer screening? Many types of cancers can be detected early and may often  be prevented. Depending on your health history and family history, you may need to have cancer screening at various ages. This may include screening for:  Colorectal cancer.  Prostate cancer.  Skin cancer.  Lung cancer. What should I know about heart disease, diabetes, and high blood pressure? Blood pressure and heart disease  High blood pressure causes heart disease and increases the risk of stroke. This is more likely to develop in people who have high blood pressure readings, are of African descent, or are overweight.  Talk with your health care provider about your target blood pressure readings.  Have your blood pressure checked: ? Every 3-5 years if you are 82-8 years of age. ? Every year if you are 23 years old or older.  If you are between the ages of 14 and 4 and are a current or former smoker, ask your health care provider if you should have a one-time screening for abdominal aortic aneurysm (AAA). Diabetes Have regular diabetes screenings. This checks your fasting blood sugar level. Have the screening done:  Once every three years after age 37 if you are at a normal weight and have a low risk for diabetes.  More often and at a younger age if you are overweight or have a high risk for diabetes. What should I know about preventing infection? Hepatitis B If you have a higher risk for hepatitis B, you should be screened for this virus. Talk with your health care provider to find out if you are at risk for  hepatitis B infection. Hepatitis C Blood testing is recommended for:  Everyone born from [birth year] through 1965.  Anyone with known risk factors for hepatitis C. Sexually transmitted infections (STIs)  You should be screened each year for STIs, including gonorrhea and chlamydia, if: ? You are sexually active and are younger than 57 years of age. ? You are older than 57 years of age and your health care provider tells you that you are at risk for this type of  infection. ? Your sexual activity has changed since you were last screened, and you are at increased risk for chlamydia or gonorrhea. Ask your health care provider if you are at risk.  Ask your health care provider about whether you are at high risk for HIV. Your health care provider may recommend a prescription medicine to help prevent HIV infection. If you choose to take medicine to prevent HIV, you should first get tested for HIV. You should then be tested every 3 months for as long as you are taking the medicine. Follow these instructions at home: Lifestyle  Do not use any products that contain nicotine or tobacco, such as cigarettes, e-cigarettes, and chewing tobacco. If you need help quitting, ask your health care provider.  Do not use street drugs.  Do not share needles.  Ask your health care provider for help if you need support or information about quitting drugs. Alcohol use  Do not drink alcohol if your health care provider tells you not to drink.  If you drink alcohol: ? Limit how much you have to 0-2 drinks a day. ? Be aware of how much alcohol is in your drink. In the [place] one drink equals one 12 oz bottle of beer (355 mL), one 5 oz glass of wine (148 mL), or one 1 oz glass of hard liquor (44 mL). General instructions  Schedule regular health, dental, and eye exams.  Stay current with your vaccines.  Tell your health care provider if: ? You often feel depressed. ? You have ever been abused or do not feel safe at home. Summary  Adopting a healthy lifestyle and getting preventive care are important in promoting health and wellness.  Follow your health care provider's instructions about healthy diet, exercising, and getting tested or screened for diseases.  Follow your health care provider's instructions on monitoring your cholesterol and blood pressure. This information is not intended to replace advice given to you by your health care provider. Make sure you  discuss any questions you have with your health care provider. Document Revised: 11/28/2018 Document Reviewed: 11/28/2018 Elsevier Patient Education  2020 [employer].

## 2020-10-31 LAB — BASIC METABOLIC PANEL WITH GFR
BUN: 14 mg/dL (ref 7–25)
CO2: 27 mmol/L (ref 20–32)
Calcium: 9.8 mg/dL (ref 8.6–10.3)
Chloride: 105 mmol/L (ref 98–110)
Creat: 0.89 mg/dL (ref 0.70–1.33)
GFR, Est African American: 110 mL/min/{1.73_m2} (ref >=60)
GFR, Est Non African American: 95 mL/min/{1.73_m2} (ref >=60)
Glucose, Bld: 89 mg/dL (ref 65–99)
Potassium: 4.5 mmol/L (ref 3.5–5.3)
Sodium: 140 mmol/L (ref 135–146)

## 2020-10-31 LAB — HEPATIC FUNCTION PANEL
AG Ratio: 1.8 (calc) (ref 1.0–2.5)
ALT: 25 U/L (ref 9–46)
AST: 22 U/L (ref 10–35)
Albumin: 4.2 g/dL (ref 3.6–5.1)
Alkaline phosphatase (APISO): 90 U/L (ref 35–144)
Bilirubin, Direct: 0.2 mg/dL (ref 0.0–0.2)
Globulin: 2.3 g/dL (calc) (ref 1.9–3.7)
Indirect Bilirubin: 1 mg/dL (calc) (ref 0.2–1.2)
Total Bilirubin: 1.2 mg/dL (ref 0.2–1.2)
Total Protein: 6.5 g/dL (ref 6.1–8.1)

## 2020-10-31 LAB — CBC WITH DIFFERENTIAL/PLATELET
Absolute Monocytes: 719 cells/uL (ref 200–950)
Basophils Absolute: 52 cells/uL (ref 0–200)
Basophils Relative: 0.9 %
Eosinophils Absolute: 99 cells/uL (ref 15–500)
Eosinophils Relative: 1.7 %
HCT: 46.3 % (ref 38.5–50.0)
Hemoglobin: 15.7 g/dL (ref 13.2–17.1)
Lymphs Abs: 1032 cells/uL (ref 850–3900)
MCH: 31 pg (ref 27.0–33.0)
MCHC: 33.9 g/dL (ref 32.0–36.0)
MCV: 91.5 fL (ref 80.0–100.0)
MPV: 9.6 fL (ref 7.5–12.5)
Monocytes Relative: 12.4 %
Neutro Abs: 3898 cells/uL (ref 1500–7800)
Neutrophils Relative %: 67.2 %
Platelets: 295 10*3/uL (ref 140–400)
RBC: 5.06 10*6/uL (ref 4.20–5.80)
RDW: 12.5 % (ref 11.0–15.0)
Total Lymphocyte: 17.8 %
WBC: 5.8 10*3/uL (ref 3.8–10.8)

## 2020-10-31 LAB — LIPID PANEL
Cholesterol: 215 mg/dL — ABNORMAL HIGH (ref <200)
HDL: 53 mg/dL (ref >=40)
LDL Cholesterol (Calc): 133 mg/dL (calc) — ABNORMAL HIGH
Non-HDL Cholesterol (Calc): 162 mg/dL (calc) — ABNORMAL HIGH (ref <130)
Total CHOL/HDL Ratio: 4.1 (calc) (ref <5.0)
Triglycerides: 159 mg/dL — ABNORMAL HIGH (ref <150)

## 2020-10-31 LAB — PSA: PSA: 0.8 ng/mL (ref <=4.0)

## 2020-11-05 NOTE — Progress Notes (Signed)
Results normal but cholesterol is up even for non fasting .  Maybe nix the  fast food type   and keep exercising  The 10-year ASCVD risk score Mikey Bussing DC Brooke Bonito., et al., 2013) is: 8.2%   Values used to calculate the score:     Age: 59 years     Sex: Male     Is Non-Hispanic African American: No     Diabetic: No     Tobacco smoker: No     Systolic Blood Pressure: 017 mmHg     Is BP treated: Yes     HDL Cholesterol: 53 mg/dL     Total Cholesterol: 215 mg/dL

## 2020-11-09 NOTE — Progress Notes (Addendum)
COVID Vaccine Completed:  x2 Date COVID Vaccine completed:  March 2021 2nd dose COVID vaccine manufacturer: [place]   PCP - Shanon Ace, MD Cardiologist - 10+ years ago, negative workup   Chest x-ray -  EKG - 11-10-20 in Epic Stress Test - 09-12-07 in Epic ECHO -  Cardiac Cath -  Pacemaker/ICD device last checked:  Sleep Study - 10+ years ago, +sleep apnea CPAP - No  Fasting Blood Sugar -  Checks Blood Sugar _____ times a day  Blood Thinner Instructions: Aspirin Instructions: Last Dose:  Anesthesia review:   Patient denies shortness of breath, fever, cough and chest pain at PAT appointment.  Patient able to climb a flight of stairs and perform ADL's without assistance.   Patient verbalized understanding of instructions that were given to them at the PAT appointment. Patient was also instructed that they will need to review over the PAT instructions again at home before surgery.

## 2020-11-09 NOTE — Patient Instructions (Addendum)
DUE TO COVID-19 ONLY ONE VISITOR IS ALLOWED TO COME WITH YOU AND STAY IN THE WAITING ROOM ONLY DURING PRE OP AND PROCEDURE.   IF YOU WILL BE ADMITTED INTO THE HOSPITAL YOU ARE ALLOWED ONE SUPPORT PERSON DURING VISITATION HOURS ONLY (10AM -8PM)   . The support person may change daily. . The support person must pass our screening, gel in and out, and wear a mask at all times, including in the patient's room. . Patients must also wear a mask when staff or their support person are in the room.   COVID SWAB TESTING MUST BE COMPLETED ON:  Monday, 11-16-20 @ 3:00 PM   [address] [place], [place] [address]  (Must self quarantine after testing. Follow instructions on handout.)        Your procedure is scheduled on:   Thursday, 11-19-20   Report to [facility] Main  Entrance   Report to Short Stay at 5:30 AM   [facility])    Call this number if you have problems the morning of surgery [phone]   Do not eat food :After Midnight.   May have liquids until 4:30 AM  day of surgery  CLEAR LIQUID DIET  Foods Allowed                                                                     Foods Excluded  Water, Black Coffee and tea, regular and decaf              liquids that you cannot  Plain Jell-O in any flavor  (No red)                                     see through such as: Fruit ices (not with fruit pulp)                                      milk, soups, orange juice              Iced Popsicles (No red)                                      All solid food                                   Apple juices Sports drinks like Gatorade (No red) Lightly seasoned clear broth or consume(fat free) Sugar, honey syrup     Complete one Ensure drink the morning of surgery at  4:30 AM  the day of surgery.   Oral Hygiene is also important to reduce your risk of infection.                                    Remember - BRUSH YOUR TEETH THE MORNING OF SURGERY WITH YOUR REGULAR  TOOTHPASTE   Do NOT smoke  after Midnight   Take these medicines the morning of surgery with A SIP OF WATER:   Amlodipine                               You may not have any metal on your body including jewelry, and body piercings             Do not wear lotions, powders, perfumes/cologne, or deodorant             Men may shave face and neck.   Do not bring valuables to the hospital. [place].   Contacts, dentures or bridgework may not be worn into surgery.   Patients discharged the day of surgery will not be allowed to drive home.   Special Instructions: Bring a copy of your healthcare power of attorney and living will documents         the day of surgery if you haven't scanned them in before.              Please read over the following fact sheets you were given: IF YOU HAVE QUESTIONS ABOUT YOUR PRE OP INSTRUCTIONS PLEASE CALL [phone]   [facility] - Preparing for Surgery Before surgery, you can play an important role.  Because skin is not sterile, your skin needs to be as free of germs as possible.  You can reduce the number of germs on your skin by washing with CHG (chlorahexidine gluconate) soap before surgery.  CHG is an antiseptic cleaner which kills germs and bonds with the skin to continue killing germs even after washing. Please DO NOT use if you have an allergy to CHG or antibacterial soaps.  If your skin becomes reddened/irritated stop using the CHG and inform your nurse when you arrive at Short Stay. Do not shave (including legs and underarms) for at least 48 hours prior to the first CHG shower.  You may shave your face/neck.  Please follow these instructions carefully:  1.  Shower with CHG Soap the night before surgery and the  morning of surgery.  2.  If you choose to wash your hair, wash your hair first as usual with your normal  shampoo.  3.  After you shampoo, rinse your hair and body thoroughly to remove the shampoo.                              4.  Use CHG as you would any other liquid soap.  You can apply chg directly to the skin and wash.  Gently with a scrungie or clean washcloth.  5.  Apply the CHG Soap to your body ONLY FROM THE NECK DOWN.   Do   not use on face/ open                           Wound or open sores. Avoid contact with eyes, ears mouth and   genitals (private parts).                       Wash face,  Genitals (private parts) with your normal soap.             6.  Wash thoroughly, paying special attention to the area where your    surgery  will be performed.  7.  Thoroughly rinse your body with warm water from the neck down.  8.  DO NOT shower/wash with your normal soap after using and rinsing off the CHG Soap.                9.  Pat yourself dry with a clean towel.            10.  Wear clean pajamas.            11.  Place clean sheets on your bed the night of your first shower and do not  sleep with pets. Day of Surgery : Do not apply any lotions/deodorants the morning of surgery.  Please wear clean clothes to the hospital/surgery center.  FAILURE TO FOLLOW THESE INSTRUCTIONS MAY RESULT IN THE CANCELLATION OF YOUR SURGERY  PATIENT SIGNATURE_________________________________  NURSE SIGNATURE__________________________________  ________________________________________________________________________

## 2020-11-10 ENCOUNTER — Encounter (HOSPITAL_COMMUNITY)
Admission: RE | Admit: 2020-11-10 | Discharge: 2020-11-10 | Disposition: A | Discharge status: Home / Self Care | Payer: 59 | Source: Ambulatory Visit | Attending: General Surgery | Admitting: General Surgery

## 2020-11-10 ENCOUNTER — Other Ambulatory Visit: Payer: Self-pay

## 2020-11-10 DIAGNOSIS — Z01818 Encounter for other preprocedural examination: Secondary | ICD-10-CM | POA: Diagnosis present

## 2020-11-10 DIAGNOSIS — I10 Essential (primary) hypertension: Secondary | ICD-10-CM | POA: Diagnosis not present

## 2020-11-10 LAB — BASIC METABOLIC PANEL WITH GFR
Anion gap: 7 (ref 5–15)
BUN: 14 mg/dL (ref 6–20)
CO2: 28 mmol/L (ref 22–32)
Calcium: 8.9 mg/dL (ref 8.9–10.3)
Chloride: 104 mmol/L (ref 98–111)
Creatinine, Ser: 0.8 mg/dL (ref 0.61–1.24)
GFR, Estimated: >60 mL/min
Glucose, Bld: 107 mg/dL — ABNORMAL HIGH (ref 70–99)
Potassium: 3.5 mmol/L (ref 3.5–5.1)
Sodium: 139 mmol/L (ref 135–145)

## 2020-11-10 LAB — CBC
HCT: 46.4 % (ref 39.0–52.0)
Hemoglobin: 15.9 g/dL (ref 13.0–17.0)
MCH: 31.3 pg (ref 26.0–34.0)
MCHC: 34.3 g/dL (ref 30.0–36.0)
MCV: 91.3 fL (ref 80.0–100.0)
Platelets: 288 10*3/uL (ref 150–400)
RBC: 5.08 MIL/uL (ref 4.22–5.81)
RDW: 12.4 % (ref 11.5–15.5)
WBC: 5.8 10*3/uL (ref 4.0–10.5)
nRBC: 0 % (ref 0.0–0.2)

## 2020-11-16 ENCOUNTER — Other Ambulatory Visit (HOSPITAL_COMMUNITY)
Admission: RE | Admit: 2020-11-16 | Discharge: 2020-11-16 | Disposition: A | Discharge status: Home / Self Care | Payer: 59 | Source: Ambulatory Visit | Attending: General Surgery | Admitting: General Surgery

## 2020-11-16 DIAGNOSIS — Z20822 Contact with and (suspected) exposure to covid-19: Secondary | ICD-10-CM | POA: Diagnosis not present

## 2020-11-16 DIAGNOSIS — Z01812 Encounter for preprocedural laboratory examination: Secondary | ICD-10-CM | POA: Insufficient documentation

## 2020-11-16 LAB — SARS CORONAVIRUS 2 (TAT 6-24 HRS): SARS Coronavirus 2: NEGATIVE

## 2020-11-17 LAB — NO BLOOD PRODUCTS

## 2020-11-18 ENCOUNTER — Encounter (HOSPITAL_COMMUNITY): Payer: Self-pay | Admitting: General Surgery

## 2020-11-18 NOTE — H&P (Signed)
Barry Silva Appointment: 06/12/2020 9:45 AM Location: [facility] Patient #: [id number] DOB: [birth year] Married / Language: Barry Silva / Race: White Male   History of Present Illness Barry Silva M. Barry Lefevre Silva; 06/12/2020 10:40 AM) The patient is a 59 year old male who presents with an inguinal hernia. He comes back in to discuss a long-standing left inguinal hernia. I initially met him in 2015. He states he is now inch Cid in proceeding with repair because it is starting to bother him more frequently. He states that it bothers him at times. He especially notices it when he is standing and leaning over a table. It also tends to bother him toward the end of the day. He states that a month ago it caused him to have daily discomfort over the past week or 2 minutes not as bad. He describes it as an ache. No burning, shooting or stabbing pain. He is fairly active gentleman. Does a lot of outdoor activities. Since his last visit he had a rockclimbing accident in 2019 infractured his bilateral lower extremities. He is getting some ongoing physical therapy with his legs. He denies any chest pain, chest pressure, shortness of breath, orthopnea, paroxysmal nocturnal dyspnea. He denies any TIAs or amaurosis fugax. He does not smoke. He is a Restaurant manager, fast food. He had a prior open repair on the contralateral side as a child.   Problem List/Past Medical Barry Silva M. Barry Silva; 06/12/2020 10:41 AM) PATIENT IS Barry Silva (Z78.9)  LEFT INGUINAL HERNIA (K40.90)   Past Surgical History Barry Silva M. Barry Silva; 06/12/2020 10:40 AM) Knee Surgery  Left. Open Inguinal Hernia Surgery  Left. Colon Polyp Removal - Colonoscopy   Diagnostic Studies History Barry Silva, Barry Silva; 06/12/2020 9:48 AM) Colonoscopy  5-10 years ago  Allergies Barry Silva, Barry Silva; 06/12/2020 9:48 AM) Morphine Sulfate *ANALGESICS - OPIOID*  Nausea. Allergies Reconciled   Medication History Barry Silva M. Barry Silva;  06/12/2020 10:40 AM) amLODIPine Besylate (5MG Tablet, Oral) Active. Adderall XR (20MG Capsule ER 24HR, Oral) Active. Losartan Potassium (50MG Tablet, Oral) Active. Medications Reconciled Amphetamine-Dextroamphet ER (30MG Capsule ER 24HR, Oral) Active. Losartan Potassium-HCTZ (100-12.5MG Tablet, Oral) Active.  Social History Barry Silva, Barry Silva; 06/12/2020 9:48 AM) Alcohol use  Moderate alcohol use, Occasional alcohol use. No caffeine use  No drug use  Tobacco use  Never smoker.  Family History Barry Silva, Barry Silva; 06/12/2020 9:48 AM) Family history unknown  First Degree Relatives  Migraine Headache  Mother.  Other Problems Barry Silva M. Barry Silva; 06/12/2020 10:41 AM) Inguinal Hernia  Kidney Stone  Migraine Headache  Back Pain  Other disease, cancer, significant illness  High blood pressure  ESSENTIAL HYPERTENSION (I10)     Review of Systems Barry Silva Barry Silva; 06/12/2020 9:48 AM) General Not Present- Appetite Loss, Chills, Fatigue, Fever, Night Sweats, Weight Gain and Weight Loss. Skin Not Present- Change in Wart/Mole, Dryness, Hives, Jaundice, New Lesions, Non-Healing Wounds, Rash and Ulcer. HEENT Present- Wears glasses/contact lenses. Not Present- Earache, Hearing Loss, Hoarseness, Nose Bleed, Oral Ulcers, Ringing in the Ears, Seasonal Allergies, Sinus Pain, Sore Throat, Visual Disturbances and Yellow Eyes. Respiratory Not Present- Bloody sputum, Chronic Cough, Difficulty Breathing, Snoring and Wheezing. Breast Not Present- Breast Mass, Breast Pain, Nipple Discharge and Skin Changes. Cardiovascular Not Present- Chest Pain, Difficulty Breathing Lying Down, Leg Cramps, Palpitations, Rapid Heart Rate, Shortness of Breath and Swelling of Extremities. Gastrointestinal Not Present- Abdominal Pain, Bloating, Bloody Stool, Change in Bowel Habits, Chronic diarrhea, Constipation, Difficulty Swallowing, Excessive gas, Gets full quickly at meals, Hemorrhoids, Indigestion,  Nausea, Rectal Pain and Vomiting. Male Genitourinary Not Present- Blood in Urine, Change in Urinary Stream, Frequency, Impotence, Nocturia, Painful Urination, Urgency and Urine Leakage. Musculoskeletal Not Present- Back Pain, Joint Pain, Joint Stiffness, Muscle Pain, Muscle Weakness and Swelling of Extremities. Neurological Not Present- Decreased Memory, Fainting, Headaches, Numbness, Seizures, Tingling, Tremor, Trouble walking and Weakness. Psychiatric Not Present- Anxiety, Bipolar, Change in Sleep Pattern, Depression, Fearful and Frequent crying. Endocrine Not Present- Cold Intolerance, Excessive Hunger, Hair Changes, Heat Intolerance, Hot flashes and New Diabetes. Hematology Not Present- Blood Thinners, Easy Bruising, Excessive bleeding, Gland problems, HIV and Persistent Infections.  Vitals Barry Silva Barry Silva; 06/12/2020 9:48 AM) 06/12/2020 9:48 AM Weight: 163.2 lb Height: 67.75in Body Surface Area: 1.87 m Body Mass Index: 25 kg/m  Temp.: 97.23F  Pulse: 93 (Regular)  BP: 124/84(Sitting, Left Arm, Standard)       Physical Exam Barry Silva M. Barry Mukherjee Silva; 06/12/2020 10:38 AM) General Mental Status-Alert. General Appearance-Consistent with stated age. Hydration-Well hydrated. Voice-Normal.  Integumentary Note: old scars b/l LE   Head and Neck Head-normocephalic, atraumatic with no lesions or palpable masses. Trachea-midline. Thyroid Gland Characteristics - normal size and consistency.  Eye Eyeball - Bilateral-Extraocular movements intact. Sclera/Conjunctiva - Bilateral-No scleral icterus.  Chest and Lung Exam Chest and lung exam reveals -quiet, even and easy respiratory effort with no use of accessory muscles and on auscultation, normal breath sounds, no adventitious sounds and normal vocal resonance. Inspection Chest Wall - Normal. Back - normal.  Breast - Did not examine.  Cardiovascular Cardiovascular examination reveals -normal heart  sounds, regular rate and rhythm with no murmurs and normal pedal pulses bilaterally.  Abdomen Inspection Inspection of the abdomen reveals - No Hernias. Skin - Scar - no surgical scars. Palpation/Percussion Palpation and Percussion of the abdomen reveal - Soft, Non Tender, No Rebound tenderness, No Rigidity (guarding) and No hepatosplenomegaly. Auscultation Auscultation of the abdomen reveals - Bowel sounds normal.  Male Genitourinary Note: both testicles down; reducible Left inguinal hernia noticed while standing; nontender; no evidence of RIH with valsalva   Peripheral Vascular Upper Extremity Palpation - Pulses bilaterally normal.  Neurologic Neurologic evaluation reveals -alert and oriented x 3 with no impairment of recent or remote memory. Mental Status-Normal.  Neuropsychiatric The patient's mood and affect are described as -normal. Judgment and Insight-insight is appropriate concerning matters relevant to self.  Musculoskeletal Normal Exam - Left-Upper Extremity Strength Normal and Lower Extremity Strength Normal. Normal Exam - Right-Upper Extremity Strength Normal and Lower Extremity Strength Normal.  Lymphatic Head & Neck  General Head & Neck Lymphatics: Bilateral - Description - Normal. Axillary - Did not examine. Femoral & Inguinal - Did not examine.    Assessment & Plan Barry Silva M. Sachi Boulay Silva; 06/12/2020 10:41 AM) LEFT INGUINAL HERNIA (K40.90) Impression: We rediscussed the etiology of inguinal hernias. We discussed the signs & symptoms of incarceration & strangulation. We rediscussed non-operative and operative management both open and MIS.  The patient has elected to proceed with Xi robot/lap left inguinal hernia repair with mesh. he would like surgery in late november/dec since he is still doing some rehab with his legs.   I described the procedure in detail. The patient was given educational material. We discussed the risks and benefits including  but not limited to bleeding, infection, chronic inguinal pain, nerve entrapment, hernia recurrence, mesh complications, hematoma formation, urinary retention, injury to the testicle, numbness in the groin, blood clots, injury to the surrounding structures, and anesthesia risk. We also discussed the typical post operative recovery course,  including no heavy lifting for 4-6 weeks. I explained that the likelihood of improvement of their symptoms is good  This patient encounter took 33 minutes today to perform the following: take history, perform exam, review outside records,, counsel the patient on their diagnosis and document encounter, findings & plan in the EHR Current Plans Pt Education - Pamphlet Given - Laparoscopic Hernia Repair: discussed with patient and provided information. You are being scheduled for surgery- Our schedulers will call youin September for surgery in late nov/dec.  please call office if you have not heard from schedulers in September   If you have other questions about your diagnosis, plan, or surgery, call the office and ask for your surgeon's nurse.  ESSENTIAL HYPERTENSION (I10) PATIENT IS Barry Silva (Z78.9)  Barry Ruff. Barry Silva, FACS General, Bariatric, & Minimally Invasive Surgery [facility], [place]

## 2020-11-19 ENCOUNTER — Other Ambulatory Visit: Payer: Self-pay

## 2020-11-19 ENCOUNTER — Ambulatory Visit (HOSPITAL_COMMUNITY): Payer: 59 | Admitting: Anesthesiology

## 2020-11-19 ENCOUNTER — Encounter (HOSPITAL_COMMUNITY): Payer: Self-pay | Admitting: General Surgery

## 2020-11-19 ENCOUNTER — Encounter (HOSPITAL_COMMUNITY)
Admission: RE | Disposition: A | Discharge status: Home / Self Care | Payer: Self-pay | Source: Home / Self Care | Attending: General Surgery

## 2020-11-19 ENCOUNTER — Ambulatory Visit (HOSPITAL_COMMUNITY)
Admission: RE | Admit: 2020-11-19 | Discharge: 2020-11-19 | Disposition: A | Discharge status: Home / Self Care | Payer: 59 | Attending: General Surgery | Admitting: General Surgery

## 2020-11-19 DIAGNOSIS — Z79899 Other long term (current) drug therapy: Secondary | ICD-10-CM | POA: Diagnosis not present

## 2020-11-19 DIAGNOSIS — K4090 Unilateral inguinal hernia, without obstruction or gangrene, not specified as recurrent: Secondary | ICD-10-CM | POA: Insufficient documentation

## 2020-11-19 HISTORY — PX: XI ROBOTIC ASSISTED INGUINAL HERNIA REPAIR WITH MESH: SHX6706

## 2020-11-19 SURGERY — REPAIR, HERNIA, INGUINAL, ROBOT-ASSISTED, LAPAROSCOPIC, USING MESH
Anesthesia: General | Site: Abdomen | Laterality: Left

## 2020-11-19 MED ORDER — ENSURE PRE-SURGERY PO LIQD
296.0000 mL | Freq: Once | ORAL | Status: DC
  Filled 2020-11-19: qty 296

## 2020-11-19 MED ORDER — DEXAMETHASONE SODIUM PHOSPHATE 4 MG/ML IJ SOLN: 4.0000 mg | INTRAMUSCULAR | Status: DC

## 2020-11-19 MED ORDER — DEXAMETHASONE SODIUM PHOSPHATE 10 MG/ML IJ SOLN
INTRAMUSCULAR | Status: DC | PRN
  Administered 2020-11-19: 10 mg via INTRAVENOUS

## 2020-11-19 MED ORDER — PROPOFOL 500 MG/50ML IV EMUL
INTRAVENOUS | Status: AC
  Filled 2020-11-19: qty 50

## 2020-11-19 MED ORDER — OXYCODONE HCL 5 MG PO TABS: 5.0000 mg | ORAL_TABLET | Freq: Once | ORAL | Status: DC | PRN

## 2020-11-19 MED ORDER — DEXAMETHASONE SODIUM PHOSPHATE 10 MG/ML IJ SOLN
INTRAMUSCULAR | Status: AC
  Filled 2020-11-19: qty 3

## 2020-11-19 MED ORDER — FENTANYL CITRATE (PF) 100 MCG/2ML IJ SOLN
INTRAMUSCULAR | Status: AC
  Filled 2020-11-19: qty 2

## 2020-11-19 MED ORDER — MIDAZOLAM HCL 5 MG/5ML IJ SOLN
INTRAMUSCULAR | Status: DC | PRN
  Administered 2020-11-19: 2 mg via INTRAVENOUS

## 2020-11-19 MED ORDER — 0.9 % SODIUM CHLORIDE (POUR BTL) OPTIME
TOPICAL | Status: DC | PRN
  Administered 2020-11-19: 1000 mL

## 2020-11-19 MED ORDER — LIDOCAINE HCL (PF) 2 % IJ SOLN
INTRAMUSCULAR | Status: AC
  Filled 2020-11-19: qty 15

## 2020-11-19 MED ORDER — PROPOFOL 10 MG/ML IV BOLUS
INTRAVENOUS | Status: AC
  Filled 2020-11-19: qty 40

## 2020-11-19 MED ORDER — PROPOFOL 10 MG/ML IV BOLUS
INTRAVENOUS | Status: DC | PRN
  Administered 2020-11-19: 190 mg via INTRAVENOUS

## 2020-11-19 MED ORDER — ORAL CARE MOUTH RINSE: 15.0000 mL | Freq: Once | OROMUCOSAL | Status: AC

## 2020-11-19 MED ORDER — ONDANSETRON HCL 4 MG/2ML IJ SOLN: 4.0000 mg | Freq: Once | INTRAMUSCULAR | Status: DC | PRN

## 2020-11-19 MED ORDER — SODIUM CHLORIDE (PF) 0.9 % IJ SOLN
INTRAMUSCULAR | Status: AC
  Filled 2020-11-19: qty 50

## 2020-11-19 MED ORDER — FENTANYL CITRATE (PF) 100 MCG/2ML IJ SOLN
INTRAMUSCULAR | Status: DC | PRN
  Administered 2020-11-19: 50 ug via INTRAVENOUS
  Administered 2020-11-19: 100 ug via INTRAVENOUS

## 2020-11-19 MED ORDER — GABAPENTIN 300 MG PO CAPS
300.0000 mg | ORAL_CAPSULE | ORAL | Status: AC
  Administered 2020-11-19: 300 mg via ORAL
  Filled 2020-11-19: qty 1

## 2020-11-19 MED ORDER — BUPIVACAINE LIPOSOME 1.3 % IJ SUSP
Freq: Once | INTRAMUSCULAR | Status: DC
  Filled 2020-11-19: qty 20

## 2020-11-19 MED ORDER — CHLORHEXIDINE GLUCONATE CLOTH 2 % EX PADS: 6.0000 | MEDICATED_PAD | Freq: Once | CUTANEOUS | Status: DC

## 2020-11-19 MED ORDER — ONDANSETRON HCL 4 MG/2ML IJ SOLN
INTRAMUSCULAR | Status: AC
  Filled 2020-11-19: qty 6

## 2020-11-19 MED ORDER — GLYCOPYRROLATE PF 0.2 MG/ML IJ SOSY
PREFILLED_SYRINGE | INTRAMUSCULAR | Status: DC | PRN
  Administered 2020-11-19: .1 mg via INTRAVENOUS

## 2020-11-19 MED ORDER — KETOROLAC TROMETHAMINE 30 MG/ML IJ SOLN
30.0000 mg | Freq: Once | INTRAMUSCULAR | Status: AC | PRN
  Administered 2020-11-19: 30 mg via INTRAVENOUS

## 2020-11-19 MED ORDER — ACETAMINOPHEN 500 MG PO TABS
1000.0000 mg | ORAL_TABLET | ORAL | Status: AC
  Administered 2020-11-19: 1000 mg via ORAL
  Filled 2020-11-19: qty 2

## 2020-11-19 MED ORDER — CEFAZOLIN SODIUM-DEXTROSE 2-4 GM/100ML-% IV SOLN
2.0000 g | INTRAVENOUS | Status: AC
  Administered 2020-11-19: 2 g via INTRAVENOUS
  Filled 2020-11-19: qty 100

## 2020-11-19 MED ORDER — OXYCODONE HCL 5 MG/5ML PO SOLN: 5.0000 mg | Freq: Once | ORAL | Status: DC | PRN

## 2020-11-19 MED ORDER — SUGAMMADEX SODIUM 200 MG/2ML IV SOLN
INTRAVENOUS | Status: DC | PRN
  Administered 2020-11-19: 200 mg via INTRAVENOUS

## 2020-11-19 MED ORDER — MIDAZOLAM HCL 2 MG/2ML IJ SOLN
INTRAMUSCULAR | Status: AC
  Filled 2020-11-19: qty 2

## 2020-11-19 MED ORDER — DEXMEDETOMIDINE (PRECEDEX) IN NS 20 MCG/5ML (4 MCG/ML) IV SYRINGE
PREFILLED_SYRINGE | INTRAVENOUS | Status: DC | PRN
  Administered 2020-11-19: 8 ug via INTRAVENOUS
  Administered 2020-11-19: 4 ug via INTRAVENOUS

## 2020-11-19 MED ORDER — PROPOFOL 1000 MG/100ML IV EMUL
INTRAVENOUS | Status: AC
  Filled 2020-11-19: qty 100

## 2020-11-19 MED ORDER — ACETAMINOPHEN 500 MG PO TABS: 1000.0000 mg | ORAL_TABLET | Freq: Three times a day (TID) | ORAL | 0 refills | Status: AC

## 2020-11-19 MED ORDER — LIDOCAINE 2% (20 MG/ML) 5 ML SYRINGE
INTRAMUSCULAR | Status: DC | PRN
  Administered 2020-11-19: 50 mg via INTRAVENOUS

## 2020-11-19 MED ORDER — BUPIVACAINE-EPINEPHRINE (PF) 0.25% -1:200000 IJ SOLN
INTRAMUSCULAR | Status: DC | PRN
  Administered 2020-11-19: 30 mL via PERINEURAL

## 2020-11-19 MED ORDER — LIDOCAINE HCL (PF) 2 % IJ SOLN
INTRAMUSCULAR | Status: AC
  Filled 2020-11-19: qty 5

## 2020-11-19 MED ORDER — BUPIVACAINE-EPINEPHRINE (PF) 0.25% -1:200000 IJ SOLN
INTRAMUSCULAR | Status: AC
  Filled 2020-11-19: qty 30

## 2020-11-19 MED ORDER — LACTATED RINGERS IV SOLN: INTRAVENOUS | Status: DC

## 2020-11-19 MED ORDER — CHLORHEXIDINE GLUCONATE 0.12 % MT SOLN
15.0000 mL | Freq: Once | OROMUCOSAL | Status: AC
  Administered 2020-11-19: 15 mL via OROMUCOSAL

## 2020-11-19 MED ORDER — SCOPOLAMINE 1 MG/3DAYS TD PT72
1.0000 | MEDICATED_PATCH | TRANSDERMAL | Status: DC
  Administered 2020-11-19: 1.5 mg via TRANSDERMAL
  Filled 2020-11-19: qty 1

## 2020-11-19 MED ORDER — OXYCODONE HCL 5 MG PO TABS: 5.0000 mg | ORAL_TABLET | Freq: Four times a day (QID) | ORAL | 0 refills | Status: DC | PRN

## 2020-11-19 MED ORDER — ONDANSETRON HCL 4 MG/2ML IJ SOLN
INTRAMUSCULAR | Status: DC | PRN
  Administered 2020-11-19: 4 mg via INTRAVENOUS

## 2020-11-19 MED ORDER — ROCURONIUM BROMIDE 10 MG/ML (PF) SYRINGE
PREFILLED_SYRINGE | INTRAVENOUS | Status: AC
  Filled 2020-11-19: qty 10

## 2020-11-19 MED ORDER — FENTANYL CITRATE (PF) 100 MCG/2ML IJ SOLN
25.0000 ug | INTRAMUSCULAR | Status: DC | PRN
  Administered 2020-11-19: 50 ug via INTRAVENOUS

## 2020-11-19 MED ORDER — DEXMEDETOMIDINE (PRECEDEX) IN NS 20 MCG/5ML (4 MCG/ML) IV SYRINGE
PREFILLED_SYRINGE | INTRAVENOUS | Status: AC
  Filled 2020-11-19: qty 20

## 2020-11-19 MED ORDER — ROCURONIUM BROMIDE 10 MG/ML (PF) SYRINGE
PREFILLED_SYRINGE | INTRAVENOUS | Status: DC | PRN
  Administered 2020-11-19: 10 mg via INTRAVENOUS
  Administered 2020-11-19: 60 mg via INTRAVENOUS

## 2020-11-19 SURGICAL SUPPLY — 58 items
APL PRP STRL LF DISP 70% ISPRP (MISCELLANEOUS) ×1
BLADE SURG SZ11 CARB STEEL (BLADE) ×3 IMPLANT
CANNULA REDUC XI 12-8 STAPL (CANNULA) ×2
CANNULA REDUC XI 12-8MM STAPL (CANNULA) ×1
CANNULA REDUCER 12-8 DVNC XI (CANNULA) IMPLANT
CHLORAPREP W/TINT 26 (MISCELLANEOUS) ×3 IMPLANT
CLOSURE STERI-STRIP 1/2X4 (GAUZE/BANDAGES/DRESSINGS) ×1
CLOSURE WOUND 1/2 X4 (GAUZE/BANDAGES/DRESSINGS) ×1
CLSR STERI-STRIP ANTIMIC 1/2X4 (GAUZE/BANDAGES/DRESSINGS) ×2 IMPLANT
COVER SURGICAL LIGHT HANDLE (MISCELLANEOUS) ×3 IMPLANT
COVER TIP SHEARS 8 DVNC (MISCELLANEOUS) IMPLANT
COVER TIP SHEARS 8MM DA VINCI (MISCELLANEOUS) ×3
COVER WAND RF STERILE (DRAPES) IMPLANT
DECANTER SPIKE VIAL GLASS SM (MISCELLANEOUS) ×3 IMPLANT
DRAPE ARM DVNC X/XI (DISPOSABLE) ×4 IMPLANT
DRAPE COLUMN DVNC XI (DISPOSABLE) ×1 IMPLANT
DRAPE DA VINCI XI ARM (DISPOSABLE) ×12
DRAPE DA VINCI XI COLUMN (DISPOSABLE) ×3
DRSG TEGADERM 2-3/8X2-3/4 SM (GAUZE/BANDAGES/DRESSINGS) ×9 IMPLANT
ELECT REM PT RETURN 15FT ADLT (MISCELLANEOUS) ×3 IMPLANT
GAUZE SPONGE 2X2 8PLY STRL LF (GAUZE/BANDAGES/DRESSINGS) ×1 IMPLANT
GLOVE BIO SURGEON STRL SZ7.5 (GLOVE) ×6 IMPLANT
GLOVE INDICATOR 8.0 STRL GRN (GLOVE) ×6 IMPLANT
GOWN STRL REUS W/TWL XL LVL3 (GOWN DISPOSABLE) ×6 IMPLANT
GRASPER SUT TROCAR 14GX15 (MISCELLANEOUS) IMPLANT
KIT BASIN OR (CUSTOM PROCEDURE TRAY) ×3 IMPLANT
KIT TURNOVER KIT A (KITS) ×2 IMPLANT
MESH 3DMAX 4X6 LT LRG (Mesh General) ×2 IMPLANT
NDL INSUFFLATION 14GA 120MM (NEEDLE) IMPLANT
NEEDLE HYPO 22GX1.5 SAFETY (NEEDLE) ×3 IMPLANT
NEEDLE INSUFFLATION 14GA 120MM (NEEDLE) IMPLANT
PACK CARDIOVASCULAR III (CUSTOM PROCEDURE TRAY) ×3 IMPLANT
PAD POSITIONING PINK XL (MISCELLANEOUS) ×3 IMPLANT
SCISSORS LAP 5X35 DISP (ENDOMECHANICALS) IMPLANT
SEAL CANN UNIV 5-8 DVNC XI (MISCELLANEOUS) ×3 IMPLANT
SEAL XI 5MM-8MM UNIVERSAL (MISCELLANEOUS) ×9
SET IRRIG TUBING LAPAROSCOPIC (IRRIGATION / IRRIGATOR) IMPLANT
SOL ANTI FOG 6CC (MISCELLANEOUS) ×1 IMPLANT
SOLUTION ANTI FOG 6CC (MISCELLANEOUS) ×2
SOLUTION ELECTROLUBE (MISCELLANEOUS) ×3 IMPLANT
SPONGE GAUZE 2X2 STER 10/PKG (GAUZE/BANDAGES/DRESSINGS) ×2
SPONGE LAP 18X18 RF (DISPOSABLE) ×3 IMPLANT
STAPLER CANNULA SEAL DVNC XI (STAPLE) IMPLANT
STAPLER CANNULA SEAL XI (STAPLE)
STRIP CLOSURE SKIN 1/2X4 (GAUZE/BANDAGES/DRESSINGS) ×2 IMPLANT
SUT MNCRL AB 4-0 PS2 18 (SUTURE) ×3 IMPLANT
SUT VIC AB 3-0 SH 27 (SUTURE) ×6
SUT VIC AB 3-0 SH 27XBRD (SUTURE) ×2 IMPLANT
SUT VICRYL 0 UR6 27IN ABS (SUTURE) ×5 IMPLANT
SUT VLOC 180 2-0 6IN GS21 (SUTURE) ×2 IMPLANT
SUT VLOC 180 2-0 9IN GS21 (SUTURE) IMPLANT
SUT VLOC 180 3-0 9IN GS21 (SUTURE) ×3 IMPLANT
SYR 10ML ECCENTRIC (SYRINGE) IMPLANT
SYR 20ML LL LF (SYRINGE) ×3 IMPLANT
TOWEL OR 17X26 10 PK STRL BLUE (TOWEL DISPOSABLE) ×3 IMPLANT
TOWEL OR NON WOVEN STRL DISP B (DISPOSABLE) ×3 IMPLANT
TROCAR BLADELESS OPT 5 100 (ENDOMECHANICALS) IMPLANT
TUBING INSUFFLATION 10FT LAP (TUBING) ×3 IMPLANT

## 2020-11-19 NOTE — H&P (Signed)
CC: here for surgery  Requesting provider: n/a  HPI: Barry Silva is an 59 y.o. male who is here for repair of left inguinal hernia. No changes since seen surgery.  See h&P for additional info  Past Medical History:  Diagnosis Date  . Abnormal LFTs 12/01/2014   neg serologies avoid reg liver toxins and repeat in 2 months consider abd [place]  other labs if needed   . ADD (attention deficit disorder with hyperactivity)    formal evaluation  . Basal cell carcinoma (BCC) 10/2019   nasal bridge  . Complication of anesthesia    so cold - was worse than pain from injury- 10/26/18- this was not the an issue  . 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 History of kidney stones   . Hypertension   . Migraine   . MIGRAINE HEADACHE 08/30/2007   Qualifier: Diagnosis of  By: Scherrie Gerlach    . Nephrolithiasis    hx of episode 1/10 calcium stones  . OSA (obstructive sleep apnea)    mild, Apnealink 7 2011 RDI7, O2 nadir 83%  . Refusal of blood transfusions as patient is Jehovah's Witness   . Sarcoidosis    bronchoscopy 2006, mtx started 04/19/10 try off 11/29/10 (atypical cp/ ? worsening ct chest [facility]) off prednisone 7/11    Past Surgical History:  Procedure Laterality Date  . BRONCHOSCOPY  2006  . COLONOSCOPY    . EXTERNAL FIXATION LEG Bilateral 10/26/2018   Procedure: LEFT LEGEXTERNAL FIXATION .Marland KitchenSPLINT CHANGE RIGHT LEG;  Surgeon: Shona Needles, MD;  Location: [place];  Service: Orthopedics;  Laterality: Bilateral;  . EXTERNAL FIXATION REMOVAL  11/02/2018   Procedure: REMOVAL EXTERNAL FIXATION LEG;  Surgeon: Shona Needles, MD;  Location: [facility];  Service: Orthopedics;;  . HARDWARE REMOVAL Right 06/04/2019   Procedure: RIGHT FOOT HARDWARE REMOVAL;  Surgeon: Erle Crocker, MD;  Location: [place] [place];  Service: Orthopedics;  Laterality: Right;  . HERNIA REPAIR     age 45  . KIDNEY STONE SURGERY     cystocopy  . [place]   getting knee to  bend- had a previous fracture  . OPEN REDUCTION INTERNAL FIXATION (ORIF) TIBIA/FIBULA FRACTURE Left 11/02/2018   Procedure: OPEN REDUCTION INTERNAL FIXATION (ORIF) LEFT PILON FRACTURE;  Surgeon: Shona Needles, MD;  Location: [place];  Service: Orthopedics;  Laterality: Left;  . ORIF CALCANEOUS FRACTURE Right 11/02/2018   Procedure: OPEN REDUCTION INTERNAL FIXATION (ORIF) CALCANEOUS FRACTURE;  Surgeon: Shona Needles, MD;  Location: [place];  Service: Orthopedics;  Laterality: Right;  . TENDON EXPLORATION Right 06/04/2019   Procedure: EXPLORATION AND REPAIR  PERONEAL TENDONS;  Surgeon: Erle Crocker, MD;  Location: [facility];  Service: Orthopedics;  Laterality: Right;    Family History  Problem Relation Age of Onset  . Stroke Other        Grandfather father older age  . Colon cancer Neg Hx   . Esophageal cancer Neg Hx   . Rectal cancer Neg Hx   . Stomach cancer Neg Hx     Social:  reports that he has never smoked. He has never used smokeless tobacco. He reports current alcohol use of about 6.0 standard drinks of alcohol per week. He reports that he does not use drugs.  Allergies:  Allergies  Allergen Reactions  . Morphine And Related Nausea Only    Medications: I have reviewed the patient's  current medications.   ROS - all of the below systems have been reviewed with the patient and positives are indicated with bold text General: chills, fever or night sweats Eyes: blurry vision or double vision ENT: epistaxis or sore throat Allergy/Immunology: itchy/watery eyes or nasal congestion Hematologic/Lymphatic: bleeding problems, blood clots or swollen lymph nodes Endocrine: temperature intolerance or unexpected weight changes Breast: new or changing breast lumps or nipple discharge Resp: cough, shortness of breath, or wheezing CV: chest pain or dyspnea on exertion GI: as per HPI GU: dysuria, trouble voiding, or hematuria MSK: joint pain or joint  stiffness Neuro: TIA or stroke symptoms Derm: pruritus and skin lesion changes Psych: anxiety and depression  PE Blood pressure (!) 172/104, pulse 84, temperature 98.3 F (36.8 C), temperature source Oral, resp. rate 16, height 5\' 8"  (1.727 m), weight 78.4 kg, SpO2 97 %. Constitutional: NAD; conversant; no deformities Eyes: Moist conjunctiva; no lid lag; anicteric; PERRL Neck: Trachea midline; no thyromegaly Lungs: Normal respiratory effort; no tactile fremitus CV: RRR; no palpable thrills; no pitting edema GI: Abd soft, nt, obvious L groin bulge; no palpable hepatosplenomegaly MSK: Normal gait; no clubbing/cyanosis Psychiatric: Appropriate affect; alert and oriented x3 Lymphatic: No palpable cervical or axillary lymphadenopathy Skin:no rash/lesions  No results found for this or any previous visit (from the past 52 hour(s)).  No results found.  Imaging: n/a  A/P: JAYSTEN ESSNER is an 59 y.o. male with robotic repair of left inguinal hernia with mesh  IV abx ERAS protocol All questions asked and answered  Barry Ruff. Redmond Pulling, MD, FACS General, Bariatric, & Minimally Invasive Surgery [facility], [facility]

## 2020-11-19 NOTE — Discharge Instructions (Signed)
[facility]  UMBILICAL OR INGUINAL HERNIA REPAIR: POST OP INSTRUCTIONS  Always review your discharge instruction sheet given to you by the facility where your surgery was performed. IF YOU HAVE DISABILITY OR FAMILY LEAVE FORMS, YOU MUST BRING THEM TO THE OFFICE FOR PROCESSING.   DO NOT GIVE THEM TO YOUR DOCTOR.  1. A  prescription for pain medication may be given to you upon discharge.  Take your pain medication as prescribed, if needed.  If narcotic pain medicine is not needed, then you may take acetaminophen (Tylenol) or ibuprofen (Advil) as needed. 2. Take your usually prescribed medications unless otherwise directed. 3. If you need a refill on your pain medication, please contact your pharmacy.  They will contact our office to request authorization. Prescriptions will not be filled after 5 pm or on week-ends. 4. You should follow a light diet the first 24 hours after arrival home, such as soup and crackers, etc.  Be sure to include lots of fluids daily.  Resume your normal diet the day after surgery. 5. Most patients will experience some swelling and bruising around the umbilicus or in the groin and scrotum.  Ice packs and reclining will help.  Swelling and bruising can take several days to resolve.  6. It is common to experience some constipation if taking pain medication after surgery.  Increasing fluid intake and taking a stool softener (such as Colace) will usually help or prevent this problem from occurring.  A mild laxative (Milk of Magnesia or Miralax) should be taken according to package directions if there are no bowel movements after 48 hours. 7. Unless discharge instructions indicate otherwise, you may remove your bandages 24-48 hours after surgery, and you may shower at that time.  You may have steri-strips (small skin tapes) in place directly over the incision.  These strips should be left on the skin for 7-10 days.  If your surgeon used skin glue on the incision,  you may shower in 24 hours.  The glue will flake off over the next 2-3 weeks.  Any sutures or staples will be removed at the office during your follow-up visit. 8. ACTIVITIES:  You may resume regular (light) daily activities beginning the next day--such as daily self-care, walking, climbing stairs--gradually increasing activities as tolerated.  You may have sexual intercourse when it is comfortable.  Refrain from any heavy lifting or straining until approved by your doctor. a. You may drive when you are no longer taking prescription pain medication, you can comfortably wear a seatbelt, and you can safely maneuver your car and apply brakes. b. RETURN TO WORK:  9. You should see your doctor in the office for a follow-up appointment approximately 2-3 weeks after your surgery.  Make sure that you call for this appointment within a day or two after you arrive home to insure a convenient appointment time. 10. OTHER INSTRUCTIONS: DO NOT LIFT/PUSH/PULL ANYTHING GREATER THAN 15 LBS    WHEN TO CALL YOUR DOCTOR: 1. Fever over 101.0 2. Inability to urinate 3. Nausea and/or vomiting 4. Extreme swelling or bruising 5. Continued bleeding from incision. 6. Increased pain, redness, or drainage from the incision  The clinic staff is available to answer your questions during regular business hours.  Please don't hesitate to call and ask to speak to one of the nurses for clinical concerns.  If you have a medical emergency, go to the nearest emergency room or call 911.  A surgeon from [facility] is always  on call at the hospital   [address] [place], [place], [place]  [address] ?  [address], [place], [place]   [address] [phone] ? [phone] ? FAX [phone] Web site: [email or web address]   ........[name]   Managing Your Pain After Surgery Without Opioids    Thank you for participating in our program to help patients manage their pain after surgery without  opioids. This is part of our effort to provide you with the best care possible, without exposing you or your family to the risk that opioids pose.  What pain can I expect after surgery? You can expect to have some pain after surgery. This is normal. The pain is typically worse the day after surgery, and quickly begins to get better. Many studies have found that many patients are able to manage their pain after surgery with Over-the-Counter (OTC) medications such as Tylenol and Motrin. If you have a condition that does not allow you to take Tylenol or Motrin, notify your surgical team.  How will I manage my pain? The best strategy for controlling your pain after surgery is around the clock pain control with Tylenol (acetaminophen) and Motrin (ibuprofen or Advil). Alternating these medications with each other allows you to maximize your pain control. In addition to Tylenol and Motrin, you can use heating pads or ice packs on your incisions to help reduce your pain.  How will I alternate your regular strength over-the-counter pain medication? You will take a dose of pain medication every three hours. ; Start by taking 650 mg of Tylenol (2 pills of 325 mg) ; 3 hours later take 600 mg of Motrin (3 pills of 200 mg) ; 3 hours after taking the Motrin take 650 mg of Tylenol ; 3 hours after that take 600 mg of Motrin.   - 1 -  See example - if your first dose of Tylenol is at 12:00 PM   12:00 PM Tylenol 650 mg (2 pills of 325 mg)  3:00 PM Motrin 600 mg (3 pills of 200 mg)  6:00 PM Tylenol 650 mg (2 pills of 325 mg)  9:00 PM Motrin 600 mg (3 pills of 200 mg)  Continue alternating every 3 hours   We recommend that you follow this schedule around-the-clock for at least 3 days after surgery, or until you feel that it is no longer needed. Use the table on the last page of this handout to keep track of the medications you are taking. Important: Do not take more than 3000mg  of Tylenol or 1800mg  of  Motrin in a 24-hour period. Do not take ibuprofen/Motrin if you have a history of bleeding stomach ulcers, severe kidney disease, &/or actively taking a blood thinner  What if I still have pain? If you have pain that is not controlled with the over-the-counter pain medications (Tylenol and Motrin or Advil) you might have what we call "breakthrough" pain. You will receive a prescription for a small amount of an opioid pain medication such as Oxycodone, Tramadol, or Tylenol with Codeine. Use these opioid pills in the first 24 hours after surgery if you have breakthrough pain. Do not take more than 1 pill every 4-6 hours.  If you still have uncontrolled pain after using all opioid pills, don't hesitate to call our staff using the number provided. We will help make sure you are managing your pain in the best way possible, and if necessary, we can provide a prescription for additional pain medication.   Day  1    Time  Name of Medication Number of pills taken  Amount of Acetaminophen  Pain Level   Comments  AM PM       AM PM       AM PM       AM PM       AM PM       AM PM       AM PM       AM PM       Total Daily amount of Acetaminophen Do not take more than  3,000 mg per day      Day 2    Time  Name of Medication Number of pills taken  Amount of Acetaminophen  Pain Level   Comments  AM PM       AM PM       AM PM       AM PM       AM PM       AM PM       AM PM       AM PM       Total Daily amount of Acetaminophen Do not take more than  3,000 mg per day      Day 3    Time  Name of Medication Number of pills taken  Amount of Acetaminophen  Pain Level   Comments  AM PM       AM PM       AM PM       AM PM          AM PM       AM PM       AM PM       AM PM       Total Daily amount of Acetaminophen Do not take more than  3,000 mg per day      Day 4    Time  Name of Medication Number of pills taken  Amount of Acetaminophen  Pain Level   Comments  AM  PM       AM PM       AM PM       AM PM       AM PM       AM PM       AM PM       AM PM       Total Daily amount of Acetaminophen Do not take more than  3,000 mg per day      Day 5    Time  Name of Medication Number of pills taken  Amount of Acetaminophen  Pain Level   Comments  AM PM       AM PM       AM PM       AM PM       AM PM       AM PM       AM PM       AM PM       Total Daily amount of Acetaminophen Do not take more than  3,000 mg per day       Day 6    Time  Name of Medication Number of pills taken  Amount of Acetaminophen  Pain Level  Comments  AM PM       AM PM       AM PM       AM PM  AM PM       AM PM       AM PM       AM PM       Total Daily amount of Acetaminophen Do not take more than  3,000 mg per day      Day 7    Time  Name of Medication Number of pills taken  Amount of Acetaminophen  Pain Level   Comments  AM PM       AM PM       AM PM       AM PM       AM PM       AM PM       AM PM       AM PM       Total Daily amount of Acetaminophen Do not take more than  3,000 mg per day        For additional information about how and where to safely dispose of unused opioid medications - [employer]  Disclaimer: This document contains information and/or instructional materials adapted from [place] for the typical patient with your condition. It does not replace medical advice from your health care provider because your experience may differ from that of the typical patient. Talk to your health care provider if you have any questions about this document, your condition or your treatment plan. Adapted from [place]

## 2020-11-19 NOTE — Transfer of Care (Signed)
Immediate Anesthesia Transfer of Care Note  Patient: Barry Silva  Procedure(s) Performed: Procedure(s): XI ROBOTIC ASSISTED LEFT INGUINAL HERNIA REPAIR WITH MESH (Left)  Patient Location: PACU  Anesthesia Type:General  Level of Consciousness:  sedated, patient cooperative and responds to stimulation  Airway & Oxygen Therapy:Patient Spontanous Breathing and Patient connected to face mask oxgen  Post-op Assessment:  Report given to PACU RN and Post -op Vital signs reviewed and stable  Post vital signs:  Reviewed and stable  Last Vitals:  Vitals:   11/19/20 0540 11/19/20 0925  BP: (!) 172/104 129/84  Pulse: 84 69  Resp: 16 10  Temp: 36.8 C 36.5 C  SpO2: 48% 61%    Complications: No apparent anesthesia complications

## 2020-11-19 NOTE — Anesthesia Postprocedure Evaluation (Signed)
Anesthesia Post Note  Patient: Barry Silva  Procedure(s) Performed: XI ROBOTIC ASSISTED LEFT INGUINAL HERNIA REPAIR WITH MESH (Left Abdomen)     Patient location during evaluation: PACU Anesthesia Type: General Level of consciousness: awake and alert Pain management: pain level controlled Vital Signs Assessment: post-procedure vital signs reviewed and stable Respiratory status: spontaneous breathing, nonlabored ventilation, respiratory function stable and patient connected to nasal cannula oxygen Cardiovascular status: blood pressure returned to baseline and stable Postop Assessment: no apparent nausea or vomiting Anesthetic complications: no   No complications documented.  Last Vitals:  Vitals:   11/19/20 1145 11/19/20 1302  BP: (!) 142/84 (!) 135/97  Pulse: 72 84  Resp: 16 18  Temp:  36.4 C  SpO2: 95% 98%    Last Pain:  Vitals:   11/19/20 1302  TempSrc:   PainSc: 2                  Aavya Shafer S

## 2020-11-19 NOTE — Anesthesia Preprocedure Evaluation (Addendum)
Anesthesia Evaluation  Patient identified by MRN, date of birth, ID band Patient awake    Reviewed: Allergy & Precautions, NPO status , Patient's Chart, lab work & pertinent test results  Airway Mallampati: II  TM Distance: >3 FB Neck ROM: Full    Dental no notable dental hx.    Pulmonary sleep apnea ,    Pulmonary exam normal breath sounds clear to auscultation       Cardiovascular hypertension, Normal cardiovascular exam Rhythm:Regular Rate:Normal     Neuro/Psych negative neurological ROS  negative psych ROS   GI/Hepatic negative GI ROS, Neg liver ROS,   Endo/Other  negative endocrine ROS  Renal/GU negative Renal ROS  negative genitourinary   Musculoskeletal negative musculoskeletal ROS (+)   Abdominal   Peds negative pediatric ROS (+)  Hematology negative hematology ROS (+)   Anesthesia Other Findings   Reproductive/Obstetrics negative OB ROS                            Anesthesia Physical Anesthesia Plan  ASA: II  Anesthesia Plan: General   Post-op Pain Management:    Induction: Intravenous  PONV Risk Score and Plan: 3 and Ondansetron, Dexamethasone, Treatment may vary due to age or medical condition and Scopolamine patch - Pre-op  Airway Management Planned: Oral ETT  Additional Equipment:   Intra-op Plan:   Post-operative Plan: Extubation in OR  Informed Consent: I have reviewed the patients History and Physical, chart, labs and discussed the procedure including the risks, benefits and alternatives for the proposed anesthesia with the patient or authorized representative who has indicated his/her understanding and acceptance.     Dental advisory given  Plan Discussed with: CRNA and Surgeon  Anesthesia Plan Comments:         Anesthesia Quick Evaluation

## 2020-11-19 NOTE — Op Note (Signed)
PREOPERATIVE DIAGNOSIS: left inguinal hernia.    POSTOPERATIVE DIAGNOSIS: left  direct inguinal hernia   PROCEDURE: Robotic/XI repair of left direct inguinal hernia with  mesh (rTAPP).    SURGEON: Leighton Ruff. Redmond Pulling, MD    ASSISTANT SURGEON: None.    ANESTHESIA: General plus local consisting of 0.25% Marcaine with epi.    ESTIMATED BLOOD LOSS: Minimal.    FINDINGS: The patient had a left direct hernia.  It was repaired using Bard 3dmax large left mesh    SPECIMEN: none   INDICATIONS FOR PROCEDURE: 59yo presented for repair of a symptomatic inguinal hernia. He had had this hernia > 6 yrs.  The risks and benefits including but not limited to bleeding, infection, chronic inguinal pain, nerve entrapment, hernia recurrence, mesh complications, hematoma formation, urinary retention, injury to the testicle, numbness in the groin, blood clots, injury to the surrounding structures, and anesthesia risk was discussed with the patient.   DESCRIPTION OF PROCEDURE: After obtaining verbal consent the patient was then taken back to the operating room, placed  supine on the operating room table. General endotracheal anesthesia was  established. The patient had emptied their bladder prior to going back to  the operating room. Sequential compression devices were placed. The  abdomen and groin were prepped and draped in the usual standard surgical  fashion with ChloraPrep. The patient received oral Tylenol/gabapentin preoperatively as well as IV  antibiotics prior to the incision. A surgical time-out was performed.  Local was infiltrated at the base of the umbilicus.    Next, a 1-cm vertical supraumbilical incision was made with a #11 blade. The fascia  was grasped and lifted anteriorly. Next, the fascia was incised, and  the abdominal cavity was entered. Pursestring suture was placed around  the fascial edges using a 0 Vicryl. A 12-mm robotic trocar was placed.  Pneumoperitoneum was smoothly established  up to a patient pressure of 15  mmHg. Robotic Laparoscope was advanced.  There was no evidence of injury to surrounding structures.  Patient had a left direct inguinal hernia.  A bilateral laparoscopic tap block with marcaine was performed.      Two additional 6mm robotic trochars were placed one on patient's left and one on patient's right in the midclavicular line about 2 cm above the supraumbilical trocar.  A large piece of left Bard 3D max mesh were placed through the [facility] trocar into the abdominal cavity.  I went ahead and placed a 3-0 Vicryl suture on SH into the abdomen off to the side.  I then placed one 3-0 absorbable V-Loc sutures through the [facility] trocar into the abdomen off to the side.  I then placed a 8 mm trocar through the [facility] trocar.   We then deployed the robot for pelvic surgery from patient's left.  The robot was docked.  Robotic laparoscope was placed through the supraumbilical trocar and the anatomy was targeted.  The other arms were then connected to the trochars.  A pair of MCS scissors was placed through the right trocar and a fenestrated bipolar through the left trocar all under direct visualization.  I then scrubbed out and went to the robotic console.    I then made incision along the peritoneum on the left, starting 2 inches above the anterior superior iliac spine and caring it medial toward the median umbilical ligament in a lazy S configuration using MCS scissors with electrocautery. The peritoneal flap was then gently dissected downward from the anterior abdominal wall taking care not to  injure the inferior epigastric vessels. The pubic bone was identified. The testicular vessels were identified.  Using traction and counter traction, I reduced the direct sac in  its entirety. The testicular vessels had been identified and preserved. The vas deferens was identified and preserved, and the hernia sac was stripped from those to  surrounding structures.    I then went  about creating a large pocket by lifting the peritoneum of the pelvic floor. I took great care not to injure the iliac vessels.      I then obtained the previously placed piece of Bard large 3D leftmax mesh for the left groin and placed it into the inguinal area.   half of it covered medial  to the inferior epigastric vessels and half of it lateral to the  inferior epigastric vessels. The defect was well  covered with the mesh. Mesh extended below the pubic bone.   I then secured the mesh to Cooper's ligament with an interrupted 3-0 Vicryl suture.  I placed an additional suture superior medially along the edge of the mesh medial to the inferior gastric vessel.  I placed a 3 Vicryl suture laterally along the superior lateral edge of the mesh lateral to the inferior epigastric vessels.  I then closed the peritoneal flap with a running 3-0 Vicryl V-Loc.  The mesh was well covered.  There is no defect in the peritoneal flap.  The mesh was flat.  It had not curled up.    The surgical robot was undocked and moved away from the OR table.  I scrubbed back in.    We then placed a laparoscopic needle driver and the two sutures that had been placed in the abdomen at the begin the case were removed    I removed the Hasson trocar and tied down the previously placed pursestring suture. I went ahead and placed an additional interrupted 0 vicryl at the umbilical fascia.  The closure was viewed laparoscopically. There was no evidence of fascial defect. There was no air leak at the umbilicus. There was no  evidence of injury to surrounding structures. Pneumoperitoneum was released, and the remaining trocars were removed. All skin incisions  were closed with a 4-0 Monocryl in a subcuticular fashion followed by application of Steri-Strips and sterile bandages. All needle, instrument, and sponge counts  were correct x2.   There are no immediate complications. The patient tolerated the procedure well. The patient was  extubated and taken to the  recovery room in stable condition.  Leighton Ruff. Redmond Pulling, MD, FACS General, Bariatric, & Minimally Invasive Surgery [facility], [place]

## 2020-11-19 NOTE — Anesthesia Procedure Notes (Signed)
Procedure Name: Intubation Date/Time: 11/19/2020 7:48 AM Performed by: Lavina Hamman, CRNA Pre-anesthesia Checklist: Patient identified, Emergency Drugs available, Suction available, Patient being monitored and Timeout performed Patient Re-evaluated:Patient Re-evaluated prior to induction Oxygen Delivery Method: Circle system utilized Preoxygenation: Pre-oxygenation with 100% oxygen Induction Type: IV induction Ventilation: Mask ventilation without difficulty Laryngoscope Size: Mac and 4 Grade View: Grade I Tube type: Oral Tube size: 7.5 mm Number of attempts: 1 Airway Equipment and Method: Stylet Placement Confirmation: ETT inserted through vocal cords under direct vision,  positive ETCO2,  CO2 detector and breath sounds checked- equal and bilateral Secured at: 23 cm Tube secured with: Tape Dental Injury: Teeth and Oropharynx as per pre-operative assessment  Comments: ATOI

## 2020-11-20 ENCOUNTER — Encounter (HOSPITAL_COMMUNITY): Payer: Self-pay | Admitting: General Surgery

## 2020-12-07 ENCOUNTER — Other Ambulatory Visit: Payer: Self-pay | Admitting: Internal Medicine

## 2020-12-07 NOTE — Telephone Encounter (Signed)
Patient is calling and requesting a refill for amphetamine-dextroamphetamine (ADDERALL XR) 20 MG 24 hr capsule sent to [id number]  [place], [place] [address]  Phone:  [phone] Fax:  [phone] CB is [phone]

## 2020-12-08 NOTE — Telephone Encounter (Signed)
Last OV 10/30/20, last filled 10/30/20

## 2020-12-09 MED ORDER — AMPHETAMINE-DEXTROAMPHET ER 20 MG PO CP24: 20.0000 mg | ORAL_CAPSULE | Freq: Every day | ORAL | 0 refills | Status: DC

## 2021-01-22 ENCOUNTER — Telehealth: Payer: Self-pay | Admitting: Internal Medicine

## 2021-01-22 NOTE — Telephone Encounter (Signed)
Pt call and stated he need a refill on amphetamine-dextroamphetamine (ADDERALL XR) 20 MG 24 hr capsule sent to  [id number] [address], [place] Phone:  [phone]  Fax:  [phone]

## 2021-01-25 ENCOUNTER — Other Ambulatory Visit: Payer: Self-pay

## 2021-01-25 MED ORDER — AMPHETAMINE-DEXTROAMPHET ER 20 MG PO CP24: 20.0000 mg | ORAL_CAPSULE | Freq: Every day | ORAL | 0 refills | Status: DC

## 2021-01-25 NOTE — Progress Notes (Signed)
Requesting: Adderall xt Contract : 11/27/2013 UDS: 02/19/16  Last Visit: 10/30/20 Next Visit: none Last Refill: 12/09/20  Please Advise  Sent in electronically .

## 2021-02-02 ENCOUNTER — Other Ambulatory Visit: Payer: Self-pay | Admitting: Internal Medicine

## 2021-02-08 ENCOUNTER — Other Ambulatory Visit: Payer: Self-pay | Admitting: Internal Medicine

## 2021-02-09 ENCOUNTER — Telehealth: Payer: Self-pay

## 2021-02-09 MED ORDER — VALSARTAN 160 MG PO TABS: 160.0000 mg | ORAL_TABLET | Freq: Every day | ORAL | 2 refills | Status: DC

## 2021-02-09 NOTE — Telephone Encounter (Signed)
Received fax from pharmacy that Losartan is currently on back order and they are unsure when it will be available.

## 2021-02-09 NOTE — Telephone Encounter (Signed)
Tell patient what is going on and that we will substitute a similar but may be a little more powerful blood pressure medicine Send in valsartan 160 mg once a day dispense 30 refill x2 And correct med list

## 2021-02-09 NOTE — Telephone Encounter (Signed)
I spoke with pt. He is aware the new BP med has been sent in & will monitor his BP and let us know if it drops too low on the higher dose.

## 2021-02-09 NOTE — Addendum Note (Signed)
Addended by: Rodrigo Ran on: 02/09/2021 05:06 PM   Modules accepted: Orders

## 2021-02-19 ENCOUNTER — Telehealth: Payer: Self-pay | Admitting: Internal Medicine

## 2021-02-19 NOTE — Telephone Encounter (Signed)
Patient is requesting a refill on his Adderall.  Pharmacy: [facility]   Please advise.

## 2021-02-19 NOTE — Telephone Encounter (Signed)
Last refill- 01/25/2021--30 tabs with no refills  Last office visit---10/30/2020

## 2021-02-21 MED ORDER — AMPHETAMINE-DEXTROAMPHET ER 20 MG PO CP24: 20.0000 mg | ORAL_CAPSULE | Freq: Every day | ORAL | 0 refills | Status: DC

## 2021-02-21 NOTE — Telephone Encounter (Signed)
Sent in electronically .  

## 2021-03-22 ENCOUNTER — Telehealth: Payer: Self-pay | Admitting: Internal Medicine

## 2021-03-22 MED ORDER — AMPHETAMINE-DEXTROAMPHET ER 20 MG PO CP24: 20.0000 mg | ORAL_CAPSULE | Freq: Every day | ORAL | 0 refills | Status: DC

## 2021-03-22 NOTE — Telephone Encounter (Signed)
Patient is calling and is requesting a refill for amphetamine-dextroamphetamine (ADDERALL XR) 20 MG 24 hr capsule to be sent to  [id number] [address]  [place], [place] [address]  Phone:  [phone] Fax:  [phone] CB is [phone]

## 2021-03-22 NOTE — Telephone Encounter (Signed)
Patient is aware that refill was sent in. Virtual OV scheduled for 04/05.

## 2021-03-22 NOTE — Telephone Encounter (Signed)
Last OV: 10/20/2020 Last Refill: 02/21/2021 Disp: 30    R:  0 Future OV: None scheduled

## 2021-03-22 NOTE — Telephone Encounter (Signed)
Sent in electronically . appt needed before next refill( virtual ok )

## 2021-03-23 ENCOUNTER — Encounter: Payer: Self-pay | Admitting: Internal Medicine

## 2021-03-23 ENCOUNTER — Telehealth (INDEPENDENT_AMBULATORY_CARE_PROVIDER_SITE_OTHER): Payer: 59 | Admitting: Internal Medicine

## 2021-03-23 ENCOUNTER — Other Ambulatory Visit: Payer: Self-pay | Admitting: Internal Medicine

## 2021-03-23 DIAGNOSIS — Z79899 Other long term (current) drug therapy: Secondary | ICD-10-CM | POA: Diagnosis not present

## 2021-03-23 DIAGNOSIS — Z125 Encounter for screening for malignant neoplasm of prostate: Secondary | ICD-10-CM

## 2021-03-23 DIAGNOSIS — F908 Attention-deficit hyperactivity disorder, other type: Secondary | ICD-10-CM

## 2021-03-23 DIAGNOSIS — I10 Essential (primary) hypertension: Secondary | ICD-10-CM

## 2021-03-23 DIAGNOSIS — E785 Hyperlipidemia, unspecified: Secondary | ICD-10-CM

## 2021-03-23 DIAGNOSIS — Z0000 Encounter for general adult medical examination without abnormal findings: Secondary | ICD-10-CM

## 2021-03-23 NOTE — Progress Notes (Signed)
Virtual Visit via Video Note  I connected with@ on 03/23/21 at 10:00 AM EDT by a video enabled telemedicine application and verified that I am speaking with the correct person using two identifiers. Location patient: home Location provider:work  office Persons participating in the virtual visit: patient, provider  WIth national recommendations  regarding COVID 19 pandemic   video visit is advised over in office visit for this patient.  Patient aware  of the limitations of evaluation and management by telemedicine and  availability of in person appointments. and agreed to proceed.   HPI: Barry Silva presents for video visit follow-up of medicines evaluation. 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 Work out.   ankle inc with walking  And to have fu mri .   Back to mountain biking   Now   Just exercises and 148 range  But will check late r  Off some weekends   Still helps to focus     Bad if doesn't take work  Days  Helps Stay focused on Tasks doesn't want to do or more tedious.   Sleep:  6 hours  Trying for more .   No othe rnew med problems  ROS: See pertinent positives and negatives per HPI.  Past Medical History:  Diagnosis Date  . Abnormal LFTs 12/01/2014   neg serologies avoid reg liver toxins and repeat in 2 months consider abd [place]  other labs if needed   . ADD (attention deficit disorder with hyperactivity)    formal evaluation  . Basal cell carcinoma (BCC) 10/2019   nasal bridge  . Complication of anesthesia    so cold - was worse than pain from injury- 10/26/18- this was not the an issue  . 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 History of kidney stones   . Hypertension   . Migraine   . MIGRAINE HEADACHE 08/30/2007   Qualifier: Diagnosis of  By: Scherrie Gerlach    . Nephrolithiasis    hx of episode 1/10 calcium stones  . OSA (obstructive sleep  apnea)    mild, Apnealink 7 2011 RDI7, O2 nadir 83%  . Refusal of blood transfusions as patient is Jehovah's Witness   . Sarcoidosis    bronchoscopy 2006, mtx started 04/19/10 try off 11/29/10 (atypical cp/ ? worsening ct chest [facility]) off prednisone 7/11    Past Surgical History:  Procedure Laterality Date  . BRONCHOSCOPY  2006  . COLONOSCOPY    . EXTERNAL FIXATION LEG Bilateral 10/26/2018   Procedure: LEFT LEGEXTERNAL FIXATION .Marland KitchenSPLINT CHANGE RIGHT LEG;  Surgeon: Shona Needles, MD;  Location: [facility];  Service: Orthopedics;  Laterality: Bilateral;  . EXTERNAL FIXATION REMOVAL  11/02/2018   Procedure: REMOVAL EXTERNAL FIXATION LEG;  Surgeon: Shona Needles, MD;  Location: [facility];  Service: Orthopedics;;  . HARDWARE REMOVAL Right 06/04/2019   Procedure: RIGHT FOOT HARDWARE REMOVAL;  Surgeon: Erle Crocker, MD;  Location: [facility];  Service: Orthopedics;  Laterality: Right;  . HERNIA REPAIR     age 57  . KIDNEY STONE SURGERY     cystocopy  . [place]   getting knee to bend- had a previous fracture  . OPEN REDUCTION INTERNAL FIXATION (ORIF) TIBIA/FIBULA FRACTURE Left 11/02/2018  Procedure: OPEN REDUCTION INTERNAL FIXATION (ORIF) LEFT PILON FRACTURE;  Surgeon: Shona Needles, MD;  Location: [facility];  Service: Orthopedics;  Laterality: Left;  . ORIF CALCANEOUS FRACTURE Right 11/02/2018   Procedure: OPEN REDUCTION INTERNAL FIXATION (ORIF) CALCANEOUS FRACTURE;  Surgeon: Shona Needles, MD;  Location: [facility];  Service: Orthopedics;  Laterality: Right;  . TENDON EXPLORATION Right 06/04/2019   Procedure: EXPLORATION AND REPAIR  PERONEAL TENDONS;  Surgeon: Erle Crocker, MD;  Location: [facility];  Service: Orthopedics;  Laterality: Right;  . XI ROBOTIC ASSISTED INGUINAL HERNIA REPAIR WITH MESH Left 11/19/2020   Procedure: XI ROBOTIC ASSISTED LEFT INGUINAL HERNIA REPAIR WITH MESH;  Surgeon: Greer Pickerel, MD;  Location: [facility];  Service:  General;  Laterality: Left;    Family History  Problem Relation Age of Onset  . Stroke Other        Grandfather father older age  . Colon cancer Neg Hx   . Esophageal cancer Neg Hx   . Rectal cancer Neg Hx   . Stomach cancer Neg Hx     Social History   Tobacco Use  . Smoking status: Never Smoker  . Smokeless tobacco: Never Used  Vaping Use  . Vaping Use: Never used  Substance Use Topics  . Alcohol use: Yes    Alcohol/week: 6.0 standard drinks    Types: 6 Glasses of wine per week    Comment: socially- 11/01/18- none this week  . Drug use: No      Current Outpatient Medications:  .  amLODipine (NORVASC) 5 MG tablet, TAKE 1 TABLET BY MOUTH EVERY DAY, Disp: 90 tablet, Rfl: 1 .  amphetamine-dextroamphetamine (ADDERALL XR) 20 MG 24 hr capsule, Take 1 capsule (20 mg total) by mouth daily. Appointment needed before next refill., Disp: 30 capsule, Rfl: 0 .  Ascorbic Acid (VITAMIN C) 1000 MG tablet, Take 1,000 mg by mouth 3 (three) times a week. , Disp: , Rfl:  .  CANNABIDIOL PO, Take 50 mLs by mouth daily., Disp: , Rfl:  .  losartan (COZAAR) 50 MG tablet, Take 50 mg by mouth daily., Disp: , Rfl:  .  naproxen sodium (ALEVE) 220 MG tablet, Take 220 mg by mouth daily as needed (pain)., Disp: , Rfl:   EXAM: BP Readings from Last 3 Encounters:  11/19/20 (!) 135/97  11/10/20 (!) 145/91  10/30/20 130/82    VITALS per patient if applicable: looks well   GENERAL: alert, oriented, appears well and in no acute distress  HEENT: atraumatic, conjunttiva clear, no obvious abnormalities on inspection of external nose and ears  NECK: normal movements of the head and neck  LUNGS: on inspection no signs of respiratory distress, breathing rate appears normal, no obvious gross SOB, gasping or wheezing  CV: no obvious cyanosis  MS: moves all visible extremities without noticeable abnormality abrasion left elbow  Covered   PSYCH/NEURO: pleasant and cooperative, no obvious depression or  anxiety, speech and thought processing grossly intact Lab Results  Component Value Date   WBC 5.8 11/10/2020   HGB 15.9 11/10/2020   HCT 46.4 11/10/2020   PLT 288 11/10/2020   GLUCOSE 107 (H) 11/10/2020   CHOL 215 (H) 10/30/2020   TRIG 159 (H) 10/30/2020   HDL 53 10/30/2020   LDLDIRECT 142.6 05/14/2013   LDLCALC 133 (H) 10/30/2020   ALT 25 10/30/2020   AST 22 10/30/2020   NA 139 11/10/2020   K 3.5 11/10/2020   CL 104 11/10/2020   CREATININE 0.80 11/10/2020  BUN 14 11/10/2020   CO2 28 11/10/2020   TSH 1.45 10/21/2019   PSA 0.80 10/30/2020   INR 0.96 11/02/2018   HGBA1C 5.1 10/27/2017    ASSESSMENT AND PLAN:  Discussed the following assessment and plan:    ICD-10-CM   1. Attention deficit hyperactivity disorder (ADHD), other type  F90.8   2. Medication management  Z79.899   3. Essential hypertension  I10     Counseled.  Benefit more than risk of medications  to continue. Monitor bp for  Control goal disc   cpx due November or as needed  Will place lab orders  Can  Get pre visits if possible  Expectant management and discussion of plan and treatment with opportunity to ask questions and all were answered. The patient agreed with the plan and demonstrated an understanding of the instructions.   Advised to call back or seek an in-person evaluation if worsening  or having  further concerns . Return for cpx lab   November 2022.   Shanon Ace, MD

## 2021-03-31 ENCOUNTER — Other Ambulatory Visit: Payer: Self-pay | Admitting: Orthopaedic Surgery

## 2021-03-31 DIAGNOSIS — M19071 Primary osteoarthritis, right ankle and foot: Secondary | ICD-10-CM

## 2021-04-08 ENCOUNTER — Other Ambulatory Visit: Payer: 59

## 2021-04-12 ENCOUNTER — Ambulatory Visit
Admission: RE | Admit: 2021-04-12 | Discharge: 2021-04-12 | Disposition: A | Discharge status: Home / Self Care | Payer: 59 | Source: Ambulatory Visit | Attending: Orthopaedic Surgery | Admitting: Orthopaedic Surgery

## 2021-04-12 ENCOUNTER — Other Ambulatory Visit: Payer: Self-pay

## 2021-04-12 DIAGNOSIS — M19071 Primary osteoarthritis, right ankle and foot: Secondary | ICD-10-CM

## 2021-04-22 ENCOUNTER — Telehealth: Payer: Self-pay | Admitting: Internal Medicine

## 2021-04-22 MED ORDER — AMPHETAMINE-DEXTROAMPHET ER 20 MG PO CP24: 20.0000 mg | ORAL_CAPSULE | Freq: Every day | ORAL | 0 refills | Status: DC

## 2021-04-22 NOTE — Telephone Encounter (Signed)
Dr. Regis Bill is currently out of the office until 05/09/022.     Last refill- 03/22/2021--30 tabs, 0 refills Last office- 03/23/2021

## 2021-04-22 NOTE — Telephone Encounter (Signed)
Patient calling requesting refill: amphetamine-dextroamphetamine (ADDERALL XR)   [facility]: [id number] [address]  [place], [place] [address]  Phone:  [phone] Fax:  [phone]

## 2021-04-23 MED ORDER — AMPHETAMINE-DEXTROAMPHET ER 20 MG PO CP24: 20.0000 mg | ORAL_CAPSULE | Freq: Every day | ORAL | 0 refills | Status: DC

## 2021-04-23 NOTE — Telephone Encounter (Signed)
Tommi Rumps, will you [facility] [address].   Thanks

## 2021-04-23 NOTE — Addendum Note (Signed)
Addended by: Apolinar Junes on: 04/23/2021 03:18 PM   Modules accepted: Orders

## 2021-04-23 NOTE — Telephone Encounter (Signed)
LVM notifying medication had been sent to pharmacy.

## 2021-04-23 NOTE — Telephone Encounter (Signed)
Patient called back and stated that his Adderrall was sent in but the pharmacy is out and wanted to see if it can be [facility] the [facility] [address], please advise. CB is [phone]

## 2021-05-24 ENCOUNTER — Telehealth: Payer: Self-pay

## 2021-05-24 MED ORDER — AMPHETAMINE-DEXTROAMPHET ER 20 MG PO CP24: 20.0000 mg | ORAL_CAPSULE | Freq: Every day | ORAL | 0 refills | Status: DC

## 2021-05-24 NOTE — Telephone Encounter (Signed)
Done

## 2021-05-24 NOTE — Addendum Note (Signed)
Addended by: Alysia Penna A on: 05/24/2021 01:03 PM   Modules accepted: Orders

## 2021-05-24 NOTE — Telephone Encounter (Signed)
Hello Dr.Fry Pt calling for refill for Adderall. Pt had an appt. For ADHD 03/2021. Are you able to refill this for pt. Please advise

## 2021-05-25 NOTE — Telephone Encounter (Signed)
Noted! Pt notified of update. No further action needed!

## 2021-06-23 ENCOUNTER — Other Ambulatory Visit: Payer: Self-pay | Admitting: Internal Medicine

## 2021-06-23 ENCOUNTER — Other Ambulatory Visit: Payer: Self-pay

## 2021-06-24 ENCOUNTER — Other Ambulatory Visit: Payer: Self-pay

## 2021-06-24 MED ORDER — AMPHETAMINE-DEXTROAMPHET ER 20 MG PO CP24: 20.0000 mg | ORAL_CAPSULE | Freq: Every day | ORAL | 0 refills | Status: DC

## 2021-06-24 NOTE — Telephone Encounter (Signed)
Patient has requested a refill on Adderall XR 20 MG.  [id number] [place] [place] [address] [facility] [address]

## 2021-06-24 NOTE — Addendum Note (Signed)
Addended by: Nilda Riggs on: 06/24/2021 12:21 PM   Modules accepted: Orders

## 2021-06-24 NOTE — Addendum Note (Signed)
Addended byShanon Ace K on: 06/24/2021 12:40 PM   Modules accepted: Orders

## 2021-06-24 NOTE — Telephone Encounter (Signed)
Pt informed of the message below and verbalized understanding. 

## 2021-07-21 ENCOUNTER — Telehealth: Payer: Self-pay | Admitting: Internal Medicine

## 2021-07-21 MED ORDER — AMPHETAMINE-DEXTROAMPHET ER 20 MG PO CP24: 20.0000 mg | ORAL_CAPSULE | Freq: Every day | ORAL | 0 refills | Status: DC

## 2021-07-21 NOTE — Telephone Encounter (Signed)
Sent in electronically .  

## 2021-07-21 NOTE — Telephone Encounter (Signed)
Noted  

## 2021-07-21 NOTE — Telephone Encounter (Signed)
PT needs a refill of their amphetamine-dextroamphetamine (ADDERALL XR) 20 MG 24 hr capsule [facility] [address].

## 2021-07-21 NOTE — Addendum Note (Signed)
Addended byBurnis Medin on: 07/21/2021 09:45 AM   Modules accepted: Orders

## 2021-08-24 ENCOUNTER — Telehealth: Payer: Self-pay

## 2021-08-24 ENCOUNTER — Other Ambulatory Visit: Payer: Self-pay | Admitting: Internal Medicine

## 2021-08-24 MED ORDER — AMPHETAMINE-DEXTROAMPHET ER 20 MG PO CP24: 20.0000 mg | ORAL_CAPSULE | Freq: Every day | ORAL | 0 refills | Status: DC

## 2021-08-24 NOTE — Telephone Encounter (Signed)
PT needs a refill of their amphetamine-dextroamphetamine (ADDERALL XR) 20 MG 24 hr capsule [facility] into the [facility] [address].

## 2021-08-24 NOTE — Telephone Encounter (Signed)
Sent in electronically .  

## 2021-08-24 NOTE — Telephone Encounter (Signed)
RX has been sent to the pt's pharmacy

## 2021-08-24 NOTE — Addendum Note (Signed)
Addended by: Nilda Riggs on: 08/24/2021 10:43 AM   Modules accepted: Orders

## 2021-08-24 NOTE — Telephone Encounter (Signed)
Patient call stating that Rx amphetamine-dextroamphetamine (ADDERALL XR) 20 MG 24 hr capsule Was sent to the wrong pharmacy and would like it sent to the [facility] [place]

## 2021-09-27 ENCOUNTER — Telehealth: Payer: Self-pay | Admitting: Internal Medicine

## 2021-09-27 MED ORDER — AMPHETAMINE-DEXTROAMPHET ER 20 MG PO CP24: 20.0000 mg | ORAL_CAPSULE | Freq: Every day | ORAL | 0 refills | Status: DC

## 2021-09-27 NOTE — Telephone Encounter (Signed)
Patient called to get refill on amphetamine-dextroamphetamine (ADDERALL XR) 20 MG 24 hr capsule    Please [facility] to  [id number] [address] [place] Phone:  [phone]  Fax:  [phone]        Good callback number is [phone]    Please Advise

## 2021-10-20 ENCOUNTER — Other Ambulatory Visit: Payer: Self-pay | Admitting: Internal Medicine

## 2021-10-29 ENCOUNTER — Telehealth: Payer: Self-pay

## 2021-10-29 MED ORDER — AMPHETAMINE-DEXTROAMPHET ER 20 MG PO CP24: 20.0000 mg | ORAL_CAPSULE | Freq: Every day | ORAL | 0 refills | Status: DC

## 2021-10-29 NOTE — Telephone Encounter (Signed)
Patient called requesting Rx refill  amphetamine-dextroamphetamine (ADDERALL XR) 20 MG 24 hr capsule   [facility]  [address]

## 2021-10-29 NOTE — Addendum Note (Signed)
Addended by: Apolinar Junes on: 10/29/2021 03:25 PM   Modules accepted: Orders

## 2021-11-21 IMAGING — MR MR ANKLE*R* W/O CM
4 of 5 series · 13 of 40 positions shown · non-contrast
Comparison: Plain films left foot 03/19/2021. MRI left ankle
05/09/2019.

CLINICAL DATA: Chronic right ankle pain. The patient suffered a
right calcaneal fracture in a fall 10/25/2018 and underwent
subsequent ORIF 11/02/2018.

EXAM:
MRI OF THE RIGHT ANKLE WITHOUT CONTRAST
TECHNIQUE: Multiplanar, multisequence MR imaging of the ankle was performed. No
intravenous contrast was administered.

[Series 3: PD fat-sat · axial · right · 3.0mm · 0.25mm/px · z∈[-122,-6]mm · 4 of 35 slices shown]
[im 1/35]
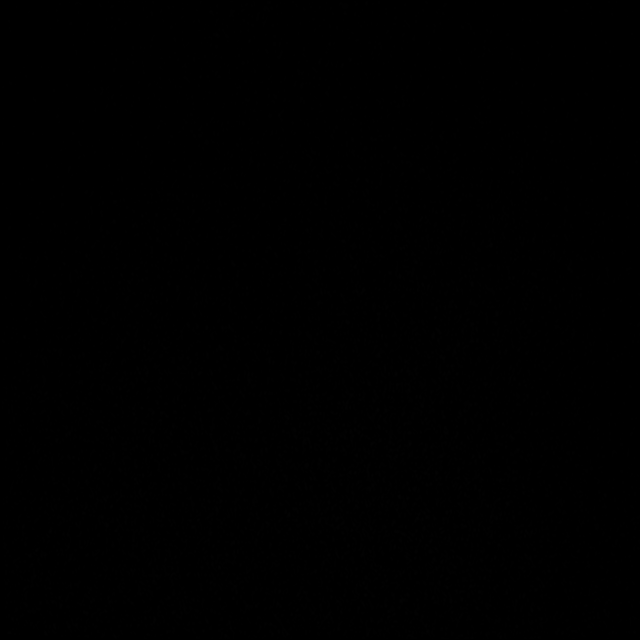
[im 5/35]
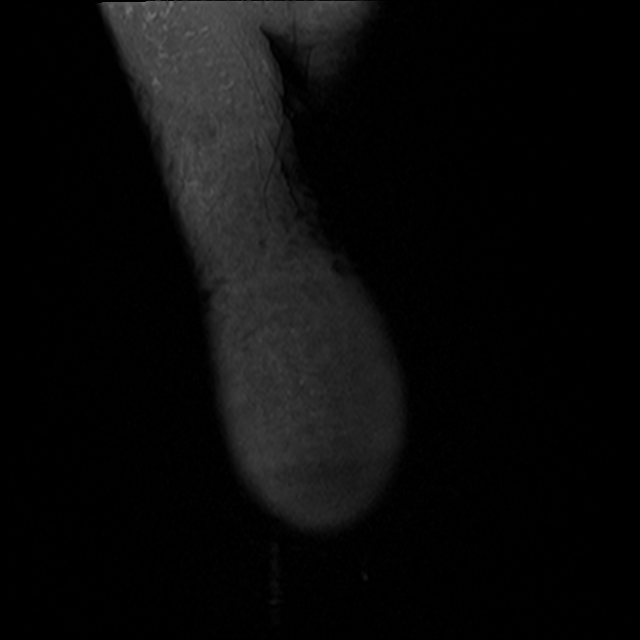
[im 18/35]
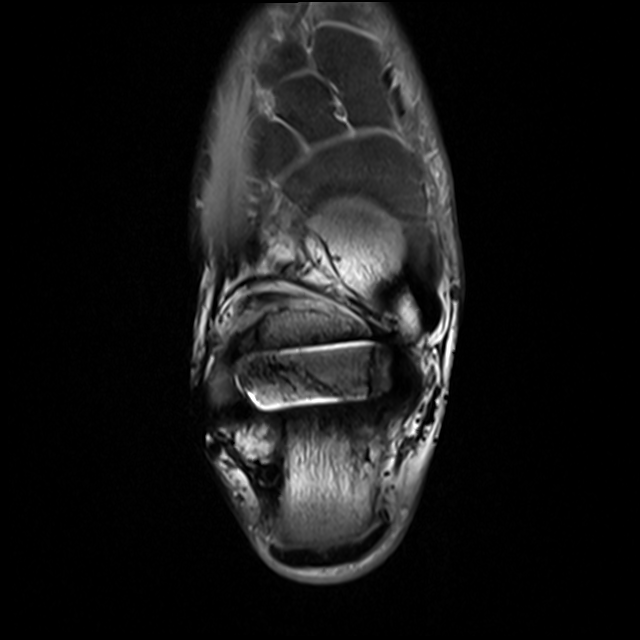
[im 30/35]
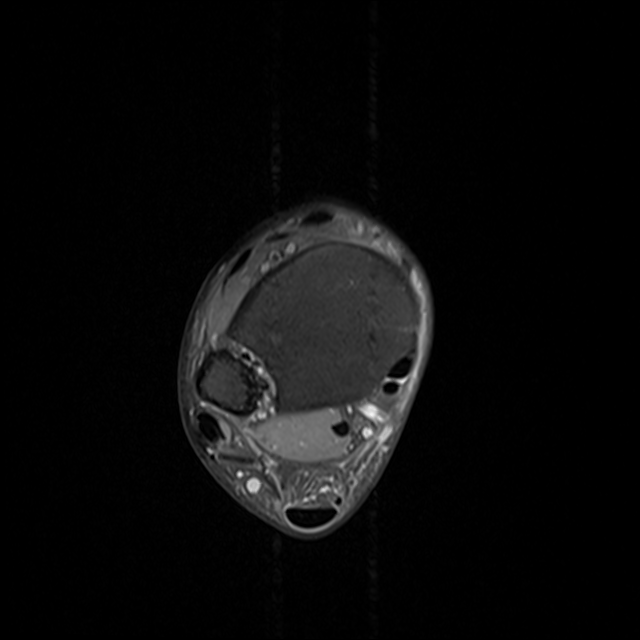

[Series 4: T2 fat-sat · axial · right · 3.0mm · 0.25mm/px · z∈[-106,-6]mm · 3 of 35 slices shown (1 of 2)]
[im 5/35]
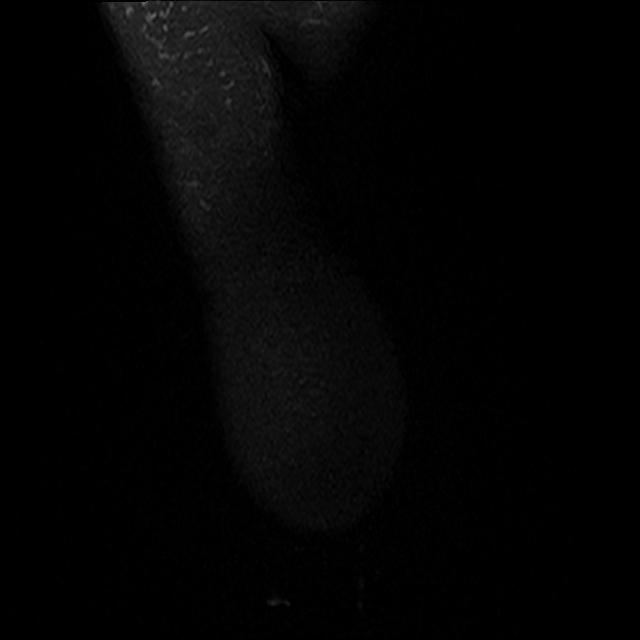
[im 18/35]
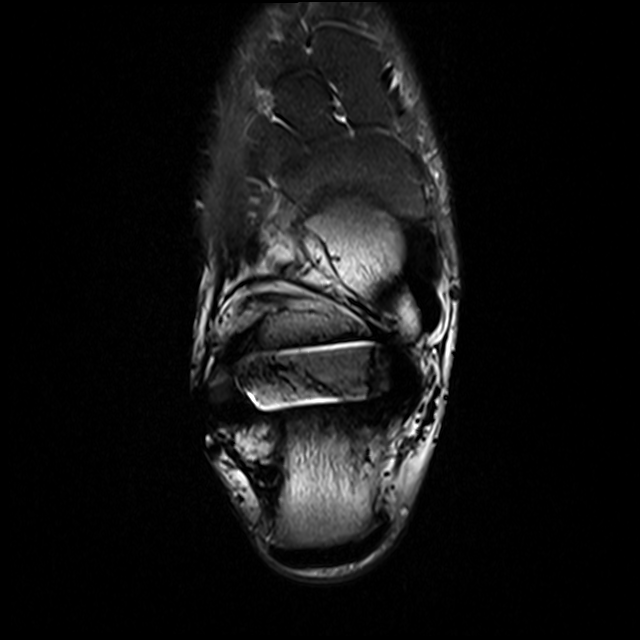
[im 30/35]
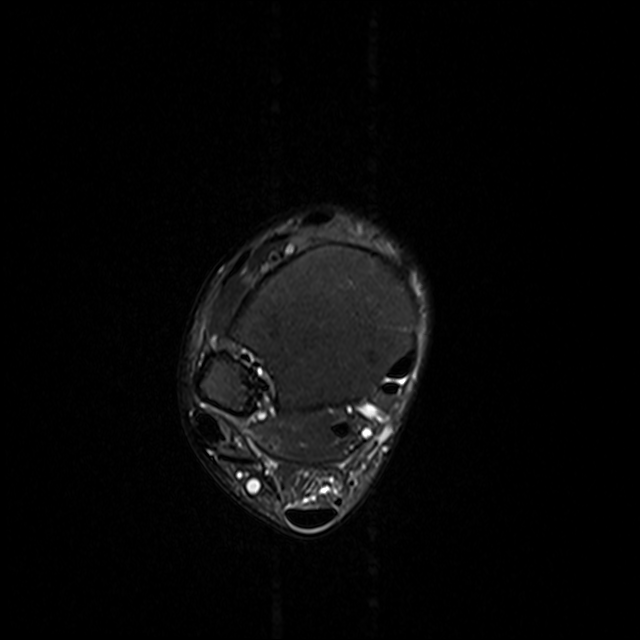

[Series 5: T1 · sagittal · right · 4.0mm · 0.27mm/px · 3 of 24 slices shown]
[im 5/24]
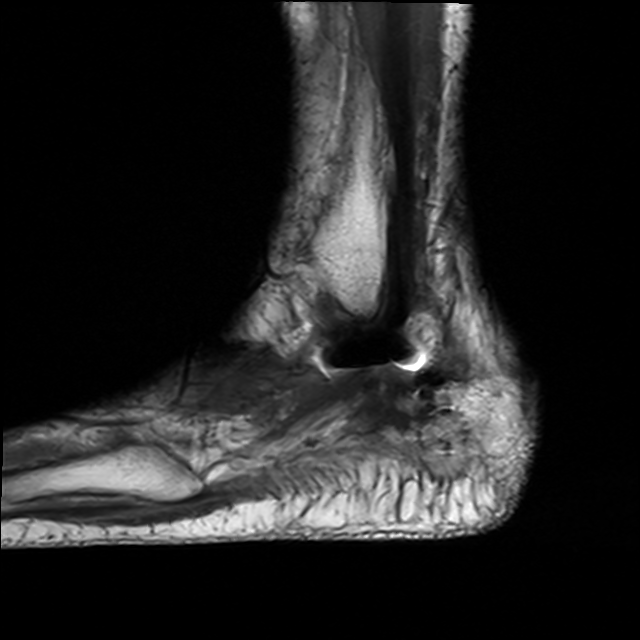
[im 14/24]
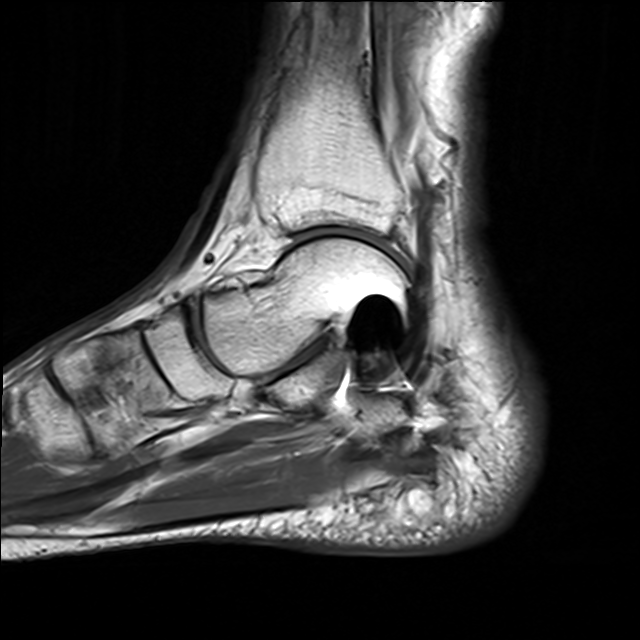
[im 24/24]
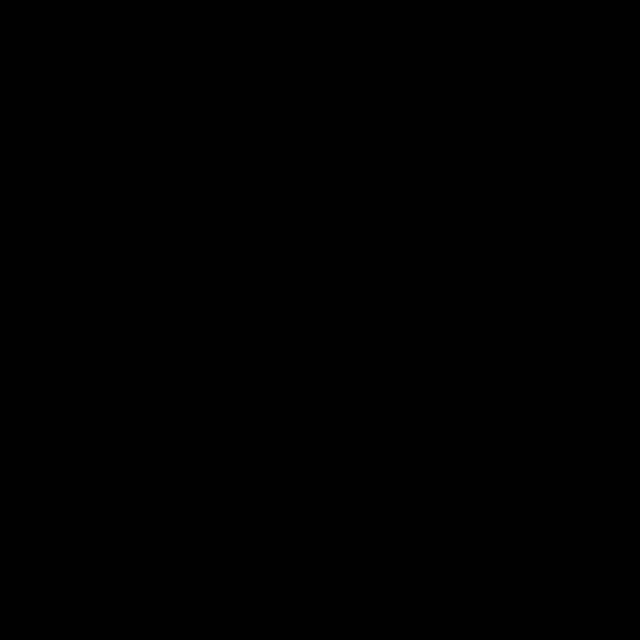

[Series 7: T2 fat-sat · coronal · right · 3.0mm · 0.25mm/px · 3 of 37 slices shown (2 of 2)]
[im 5/37]
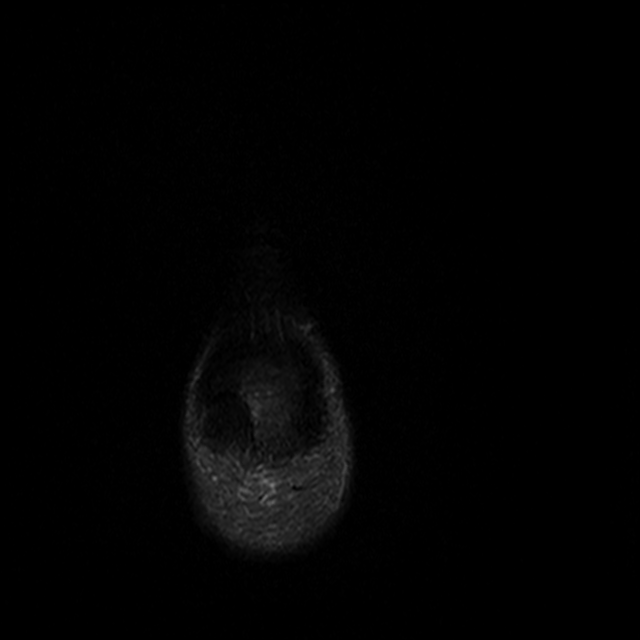
[im 21/37]
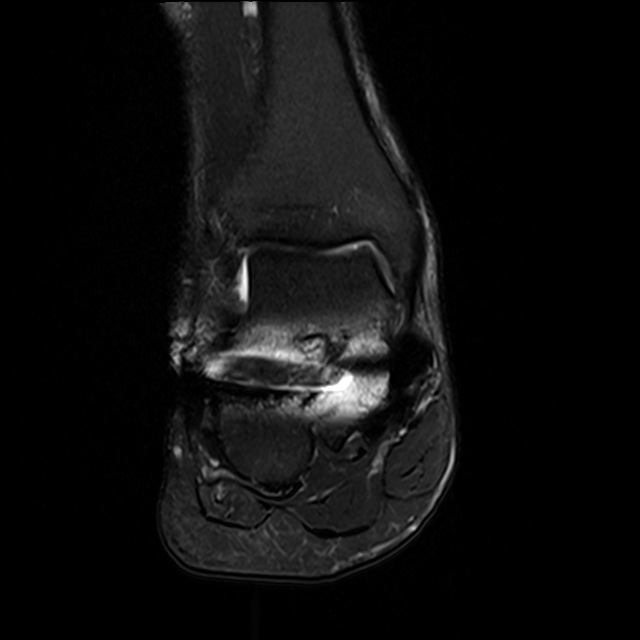
[im 33/37]
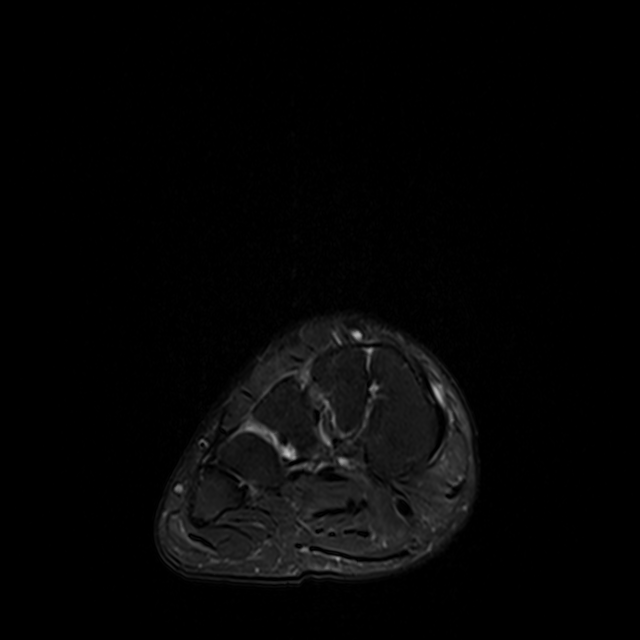

[13 of 40 positions shown; findings below may reference images not displayed]

FINDINGS: TENDONS

Peroneal: Visualization is mildly limited along the calcaneus due to
fracture fixation hardware but no tear or other abnormality is
identified.

Posteromedial: Intact.

Anterior: Intact.

Achilles: Intact.

Plantar Fascia: Intact.  No evidence of plantar fasciitis.

LIGAMENTS

Lateral: The calcaneofibular ligament is not visualized due to
artifact from hardware. Visualized lateral ligaments appear normal.

Medial: Intact.

CARTILAGE

Ankle Joint: Normal. No osteochondral lesion of the talar dome or
joint effusion

Subtalar Joints/Sinus Tarsi: Appears normal.

Bones: Healed calcaneal fracture is identified. No fracture, stress
change or focal lesion.

Other: None.
IMPRESSION: Healed calcaneal fracture. Negative for tendon or ligament tear. No
finding to explain the patient's symptoms.

## 2021-12-01 ENCOUNTER — Telehealth: Payer: Self-pay | Admitting: Internal Medicine

## 2021-12-01 MED ORDER — AMPHETAMINE-DEXTROAMPHET ER 20 MG PO CP24: 20.0000 mg | ORAL_CAPSULE | Freq: Every day | ORAL | 0 refills | Status: DC

## 2021-12-01 NOTE — Telephone Encounter (Signed)
Pt is calling and needs refill on amphetamine-dextroamphetamine (ADDERALL XR) 20 MG 24 hr capsule send  [facility]  [id number] [address], [place] Phone:  [phone]  Fax:  [phone]

## 2021-12-14 ENCOUNTER — Encounter: Payer: Self-pay | Admitting: Internal Medicine

## 2021-12-14 NOTE — Telephone Encounter (Signed)
Based on your information yes alcohol medications can cause abnormal test.  Need a copy of the results of the tests  to review and then  follow-up. Visit perhaps to repeat testing   Hopefully they told you to see your primary doctor to follow-up and make sure it is not an important finding.

## 2021-12-19 ENCOUNTER — Other Ambulatory Visit: Payer: Self-pay | Admitting: Internal Medicine

## 2021-12-19 ENCOUNTER — Encounter: Payer: Self-pay | Admitting: Internal Medicine

## 2022-01-03 ENCOUNTER — Telehealth: Payer: Self-pay | Admitting: Internal Medicine

## 2022-01-03 NOTE — Telephone Encounter (Signed)
Last fill date 12/01/21 Last Ov 10/30/20 Vv 04/02/21 Upcoming appt 01/11/22  Is it ok to refill?

## 2022-01-03 NOTE — Telephone Encounter (Signed)
Patient called in requesting a refill for amphetamine-dextroamphetamine (ADDERALL XR) 20 MG 24 hr capsule [[id number]] to be sent to his pharmacy.  Patient could be contacted at [phone].  Please advise.

## 2022-01-05 ENCOUNTER — Telehealth: Payer: Self-pay | Admitting: Internal Medicine

## 2022-01-05 NOTE — Telephone Encounter (Signed)
Patient called to get refill on amphetamine-dextroamphetamine (ADDERALL XR) 20 MG 24 hr capsule (Expired)      [facility] to    [id number] [address], [place] Phone:  [phone]  Fax:  [phone]        Please advise

## 2022-01-05 NOTE — Telephone Encounter (Signed)
Patient is out of prescription FYI

## 2022-01-06 MED ORDER — AMPHETAMINE-DEXTROAMPHET ER 20 MG PO CP24: 20.0000 mg | ORAL_CAPSULE | Freq: Every day | ORAL | 0 refills | Status: DC

## 2022-01-06 NOTE — Addendum Note (Signed)
Addended byShanon Ace K on: 01/06/2022 02:21 PM   Modules accepted: Orders

## 2022-01-06 NOTE — Telephone Encounter (Signed)
Rx request sent to PCP. 

## 2022-01-07 ENCOUNTER — Telehealth: Payer: Self-pay | Admitting: Internal Medicine

## 2022-01-07 NOTE — Telephone Encounter (Signed)
Pt call and stated his insurance change and want cover [facility] so want [facility] sent to [facility] .

## 2022-01-10 MED ORDER — AMPHETAMINE-DEXTROAMPHET ER 20 MG PO CP24: 20.0000 mg | ORAL_CAPSULE | Freq: Every day | ORAL | 0 refills | Status: DC

## 2022-01-10 NOTE — Telephone Encounter (Signed)
Last Ov 10/30/20 Vv 03/23/21 Pt has appt 01/11/22 Is it ok to refill?

## 2022-01-10 NOTE — Addendum Note (Signed)
Addended byShanon Ace K on: 01/10/2022 12:19 PM   Modules accepted: Orders

## 2022-01-10 NOTE — Telephone Encounter (Signed)
Sent in electronically .  

## 2022-01-11 ENCOUNTER — Ambulatory Visit (INDEPENDENT_AMBULATORY_CARE_PROVIDER_SITE_OTHER): Payer: 59 | Admitting: Internal Medicine

## 2022-01-11 ENCOUNTER — Encounter: Payer: Self-pay | Admitting: Internal Medicine

## 2022-01-11 VITALS — BP 140/90 | HR 86 | Temp 98.3°F | Ht 68.0 in | Wt 161.2 lb

## 2022-01-11 DIAGNOSIS — E785 Hyperlipidemia, unspecified: Secondary | ICD-10-CM

## 2022-01-11 DIAGNOSIS — Z0000 Encounter for general adult medical examination without abnormal findings: Secondary | ICD-10-CM

## 2022-01-11 DIAGNOSIS — I10 Essential (primary) hypertension: Secondary | ICD-10-CM | POA: Diagnosis not present

## 2022-01-11 DIAGNOSIS — R7989 Other specified abnormal findings of blood chemistry: Secondary | ICD-10-CM

## 2022-01-11 DIAGNOSIS — F908 Attention-deficit hyperactivity disorder, other type: Secondary | ICD-10-CM

## 2022-01-11 DIAGNOSIS — Z79899 Other long term (current) drug therapy: Secondary | ICD-10-CM | POA: Diagnosis not present

## 2022-01-11 DIAGNOSIS — Z125 Encounter for screening for malignant neoplasm of prostate: Secondary | ICD-10-CM | POA: Diagnosis not present

## 2022-01-11 LAB — HEPATIC FUNCTION PANEL
ALT: 26 U/L (ref 0–53)
AST: 25 U/L (ref 0–37)
Albumin: 4.3 g/dL (ref 3.5–5.2)
Alkaline Phosphatase: 81 U/L (ref 39–117)
Bilirubin, Direct: 0.2 mg/dL (ref 0.0–0.3)
Total Bilirubin: 1.4 mg/dL — ABNORMAL HIGH (ref 0.2–1.2)
Total Protein: 6.7 g/dL (ref 6.0–8.3)

## 2022-01-11 LAB — PSA: PSA: 1.56 ng/mL (ref 0.10–4.00)

## 2022-01-11 LAB — BASIC METABOLIC PANEL
BUN: 13 mg/dL (ref 6–23)
CO2: 29 mEq/L (ref 19–32)
Calcium: 9.2 mg/dL (ref 8.4–10.5)
Chloride: 105 mEq/L (ref 96–112)
Creatinine, Ser: 0.93 mg/dL (ref 0.40–1.50)
GFR: 90.32 mL/min (ref >60.00)
Glucose, Bld: 86 mg/dL (ref 70–99)
Potassium: 4 mEq/L (ref 3.5–5.1)
Sodium: 141 mEq/L (ref 135–145)

## 2022-01-11 LAB — CBC WITH DIFFERENTIAL/PLATELET
Basophils Absolute: 0 10*3/uL (ref 0.0–0.1)
Basophils Relative: 0.8 % (ref 0.0–3.0)
Eosinophils Absolute: 0.1 10*3/uL (ref 0.0–0.7)
Eosinophils Relative: 1.7 % (ref 0.0–5.0)
HCT: 44 % (ref 39.0–52.0)
Hemoglobin: 15.1 g/dL (ref 13.0–17.0)
Lymphocytes Relative: 20.8 % (ref 12.0–46.0)
Lymphs Abs: 1.1 10*3/uL (ref 0.7–4.0)
MCHC: 34.2 g/dL (ref 30.0–36.0)
MCV: 88.9 fl (ref 78.0–100.0)
Monocytes Absolute: 0.7 10*3/uL (ref 0.1–1.0)
Monocytes Relative: 13.2 % — ABNORMAL HIGH (ref 3.0–12.0)
Neutro Abs: 3.4 10*3/uL (ref 1.4–7.7)
Neutrophils Relative %: 63.5 % (ref 43.0–77.0)
Platelets: 278 10*3/uL (ref 150.0–400.0)
RBC: 4.95 Mil/uL (ref 4.22–5.81)
RDW: 12.8 % (ref 11.5–15.5)
WBC: 5.4 10*3/uL (ref 4.0–10.5)

## 2022-01-11 LAB — LIPID PANEL
Cholesterol: 214 mg/dL — ABNORMAL HIGH (ref 0–200)
HDL: 52 mg/dL (ref >39.00)
LDL Cholesterol: 146 mg/dL — ABNORMAL HIGH (ref 0–99)
NonHDL: 161.87
Total CHOL/HDL Ratio: 4
Triglycerides: 77 mg/dL (ref 0.0–149.0)
VLDL: 15.4 mg/dL (ref 0.0–40.0)

## 2022-01-11 LAB — GAMMA GT: GGT: 93 U/L — ABNORMAL HIGH (ref 7–51)

## 2022-01-11 LAB — HEMOGLOBIN A1C: Hgb A1c MFr Bld: 5.2 % (ref 4.6–6.5)

## 2022-01-11 NOTE — Patient Instructions (Addendum)
Please go to lab and get lab panel done ( all future labs)   The bilirubin looks like not a liver problem but will repeat. Your exam is good  today   Suspect  meds could do this.

## 2022-01-11 NOTE — Progress Notes (Signed)
No chief complaint on file.   HPI: Barry Silva 61 y.o. come in for   Also applying   for life  insurance  and exam and blood work  liver was off. ROS: See pertinent positives and negatives per HPI.  Bili and ggt 112  no hx of jaundice  Meloxicam use   in recent past ankles and   last  2-3 weeks  not taken in prep for  fu .  Reduced etoh    QOD  does do cbd oil supplements to help with pain .   Past Medical History:  Diagnosis Date   Abnormal LFTs 12/01/2014   neg serologies avoid reg liver toxins and repeat in 2 months consider abd [place]  other labs if needed    ADD (attention deficit disorder with hyperactivity)    formal evaluation   Basal cell carcinoma (BCC) 10/2019   nasal bridge   Complication of anesthesia    so cold - was worse than pain from injury- 10/26/18- this was not the an issue   Fracture    femur 1984 residual nerve damage leg leg   History of kidney stones    Hypertension    Migraine    MIGRAINE HEADACHE 08/30/2007   Qualifier: Diagnosis of  By: Sherren Mocha, RN, Dorian Pod     Nephrolithiasis    hx of episode 1/10 calcium stones   OSA (obstructive sleep apnea)    mild, Apnealink 7 2011 RDI7, O2 nadir 83%   Refusal of blood transfusions as patient is Jehovah's Witness    Sarcoidosis    bronchoscopy 2006, mtx started 04/19/10 try off 11/29/10 (atypical cp/ ? worsening ct chest [facility]) off prednisone 7/11    Family History  Problem Relation Age of Onset   Stroke Other        Grandfather father older age   Colon cancer Neg Hx    Esophageal cancer Neg Hx    Rectal cancer Neg Hx    Stomach cancer Neg Hx     Social History   Socioeconomic History   Marital status: Married    Spouse name: Not on file   Number of children: Not on file   Years of education: Not on file   Highest education level: Not on file  Occupational History   Not on file  Tobacco Use   Smoking status: Never   Smokeless tobacco: Never  Vaping Use   Vaping Use: Never used  Substance and  Sexual Activity   Alcohol use: Yes    Alcohol/week: 6.0 standard drinks    Types: 6 Glasses of wine per week    Comment: socially- 11/01/18- none this week   Drug use: No   Sexual activity: Not on file  Other Topics Concern   Not on file  Social History Narrative   Married   [place] of 2-3   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   Family in [place]   No tobacco or ETS   Social Determinants of Radio broadcast assistant Strain: Not on file  Food Insecurity: Not on file  Transportation Needs: Not on file  Physical Activity: Not on file  Stress: Not on file  Social Connections: Not on file    Outpatient Medications Prior to Visit  Medication Sig Dispense Refill   amLODipine (NORVASC) 5 MG tablet TAKE 1 TABLET BY MOUTH EVERY DAY 90 tablet 0   amphetamine-dextroamphetamine (ADDERALL XR) 20 MG 24 hr capsule Take 1 capsule (20  mg total) by mouth daily. 30 capsule 0   Ascorbic Acid (VITAMIN C) 1000 MG tablet Take 1,000 mg by mouth 3 (three) times a week.      CANNABIDIOL PO Take 50 mLs by mouth daily.     losartan (COZAAR) 50 MG tablet Take 50 mg by mouth daily.     meloxicam (MOBIC) 15 MG tablet Take 1 tablet (15 mg total) by mouth daily as needed for pain. 30 tablet 1   meloxicam (MOBIC) 15 MG tablet Take 15 mg by mouth 3 (three) times daily.     No facility-administered medications prior to visit.     EXAM:  BP 140/90 (BP Location: Right Arm, Patient Position: Sitting, Cuff Size: Normal)    Pulse 86    Temp 98.3 F (36.8 C) (Oral)    Ht 5\' 8"  (1.727 m)    Wt 161 lb 3.2 oz (73.1 kg)    SpO2 97%    BMI 24.51 kg/m   Body mass index is 24.51 kg/m. Bp reported as normal at insurance pnusical 120/80 ranges  GENERAL: vitals reviewed and listed above, alert, oriented, appears well hydrated and in no acute distress HEENT: atraumatic, conjunctiva  clear, no obvious abnormalities on inspection of external nose and ears OP : masked  NECK: no obvious masses on inspection palpation   LUNGS: clear to auscultation bilaterally, no wheezes, rales or rhonchi, good air movement CV: HRRR, no clubbing cyanosis or  peripheral edema nl cap refill  Abdomen:  Sof,t normal bowel sounds without hepatosplenomegaly, no guarding rebound or masses no CVA tendernes PSYCH: pleasant and cooperative, no obvious depression or anxiety Lab Results  Component Value Date   WBC 5.4 01/11/2022   HGB 15.1 01/11/2022   HCT 44.0 01/11/2022   PLT 278.0 01/11/2022   GLUCOSE 86 01/11/2022   CHOL 214 (H) 01/11/2022   TRIG 77.0 01/11/2022   HDL 52.00 01/11/2022   LDLDIRECT 142.6 05/14/2013   LDLCALC 146 (H) 01/11/2022   ALT 26 01/11/2022   AST 25 01/11/2022   NA 141 01/11/2022   K 4.0 01/11/2022   CL 105 01/11/2022   CREATININE 0.93 01/11/2022   BUN 13 01/11/2022   CO2 29 01/11/2022   TSH 1.45 10/21/2019   PSA 1.56 01/11/2022   INR 0.96 11/02/2018   HGBA1C 5.2 01/11/2022   BP Readings from Last 3 Encounters:  01/11/22 140/90  11/19/20 (!) 135/97  11/10/20 (!) 145/91    ASSESSMENT AND PLAN:  Discussed the following assessment and plan:  Abnormal LFTs - see text  elevated ggt  and indirect bili  - Plan: Gamma GT, Gamma GT, CANCELED: Gamma GT  Essential hypertension - Plan: Lipid panel, Hepatic function panel, Hemoglobin A1c, CBC with Differential/Platelet, Basic metabolic panel  Medication management - Plan: Lipid panel, Hepatic function panel, Hemoglobin A1c, CBC with Differential/Platelet, Basic metabolic panel  Attention deficit hyperactivity disorder (ADHD), other type - Plan: Lipid panel, Hepatic function panel, Hemoglobin A1c, CBC with Differential/Platelet, Basic metabolic panel  Hyperlipidemia, unspecified hyperlipidemia type - Plan: Lipid panel, Hepatic function panel, Hemoglobin A1c, CBC with Differential/Platelet, Basic metabolic panel  PV lab  - Plan: PSA, Lipid panel, Hepatic function panel, Hemoglobin A1c, CBC with Differential/Platelet, Basic metabolic  panel  Screening PSA (prostate specific antigen) - Plan: PSA Incidental  finding elevated ggt   mild elevation of ind bili  no evidence of hemolysis or other   has  been off and on for past few years  on lab review. Plan avoid  limits meds etc  lab today and then fu  -Patient advised to return or notify health care team  if  new concerns arise.  Patient Instructions  Please go to lab and get lab panel done ( all future labs)   The bilirubin looks like not a liver problem but will repeat. Your exam is good  today   Suspect  meds could do this.    Standley Brooking. Marijah Larranaga M.D.

## 2022-01-14 ENCOUNTER — Other Ambulatory Visit: Payer: Self-pay | Admitting: Internal Medicine

## 2022-01-14 ENCOUNTER — Encounter: Payer: Self-pay | Admitting: Internal Medicine

## 2022-01-14 DIAGNOSIS — R7989 Other specified abnormal findings of blood chemistry: Secondary | ICD-10-CM

## 2022-01-14 NOTE — Progress Notes (Signed)
So the GGT test  is still elevated but  not diagnostic  of any specific disease and isolated finding .   Please stop all supplements and no alcohol .    Plan  fu u labs in 2-3 weeks  . I will order order  a liver ultrasound to be complete ( non invasive look at the liver )   I will place the orders .

## 2022-01-27 ENCOUNTER — Encounter: Payer: Self-pay | Admitting: Internal Medicine

## 2022-02-08 ENCOUNTER — Other Ambulatory Visit: Payer: Self-pay | Admitting: Internal Medicine

## 2022-02-08 MED ORDER — AMPHETAMINE-DEXTROAMPHET ER 20 MG PO CP24: 20.0000 mg | ORAL_CAPSULE | Freq: Every day | ORAL | 0 refills | Status: DC

## 2022-02-08 NOTE — Telephone Encounter (Signed)
Patient called in requesting a refill for amphetamine-dextroamphetamine (ADDERALL XR) 20 MG 24 hr capsule [[id number]]  to be sent to his pharmacy.  [id number] [address], [place], [place] [address]  Please advise.

## 2022-02-08 NOTE — Telephone Encounter (Signed)
Last OV: 12/22/2021 Last refill: 01/10/2022 Future OV: None

## 2022-02-09 ENCOUNTER — Other Ambulatory Visit (INDEPENDENT_AMBULATORY_CARE_PROVIDER_SITE_OTHER): Payer: 59

## 2022-02-09 DIAGNOSIS — R7989 Other specified abnormal findings of blood chemistry: Secondary | ICD-10-CM

## 2022-02-09 LAB — HEPATIC FUNCTION PANEL
ALT: 31 U/L (ref 0–53)
AST: 21 U/L (ref 0–37)
Albumin: 4.4 g/dL (ref 3.5–5.2)
Alkaline Phosphatase: 70 U/L (ref 39–117)
Bilirubin, Direct: 0.2 mg/dL (ref 0.0–0.3)
Total Bilirubin: 1.8 mg/dL — ABNORMAL HIGH (ref 0.2–1.2)
Total Protein: 7 g/dL (ref 6.0–8.3)

## 2022-02-09 LAB — GAMMA GT: GGT: 82 U/L — ABNORMAL HIGH (ref 7–51)

## 2022-02-13 NOTE — Progress Notes (Signed)
Ggt still mildly elevated  still awaiting  one more lab result  and abdominal liver ultrasound.  Barry Silva please check on the abd ultrasound I ordered in January  I dont see any appt or contact.  Replace order  if necessary) if continuing  we may get GI consult  for opinion.

## 2022-02-14 ENCOUNTER — Telehealth: Payer: Self-pay | Admitting: Internal Medicine

## 2022-02-14 NOTE — Telephone Encounter (Signed)
Pt call and stated  [facility] (ADDERALL XR) 20 MG 24 hr capsule so he want you to send it on  [place], [place] AT [address] Phone:  [phone]  Fax:  [phone]

## 2022-02-16 ENCOUNTER — Encounter: Payer: Self-pay | Admitting: Internal Medicine

## 2022-02-16 ENCOUNTER — Ambulatory Visit
Admission: RE | Admit: 2022-02-16 | Discharge: 2022-02-16 | Disposition: A | Discharge status: Home / Self Care | Payer: 59 | Source: Ambulatory Visit | Attending: Internal Medicine | Admitting: Internal Medicine

## 2022-02-16 DIAGNOSIS — K769 Liver disease, unspecified: Secondary | ICD-10-CM | POA: Diagnosis not present

## 2022-02-16 DIAGNOSIS — R7989 Other specified abnormal findings of blood chemistry: Secondary | ICD-10-CM

## 2022-02-16 DIAGNOSIS — N281 Cyst of kidney, acquired: Secondary | ICD-10-CM | POA: Diagnosis not present

## 2022-02-16 DIAGNOSIS — N200 Calculus of kidney: Secondary | ICD-10-CM | POA: Diagnosis not present

## 2022-02-16 MED ORDER — AMPHETAMINE-DEXTROAMPHET ER 20 MG PO CP24: 20.0000 mg | ORAL_CAPSULE | Freq: Every day | ORAL | 0 refills | Status: DC

## 2022-02-16 NOTE — Telephone Encounter (Signed)
Noted  

## 2022-02-16 NOTE — Telephone Encounter (Signed)
Sent in electronically . ? To different pharmacy . As per patient request

## 2022-02-16 NOTE — Telephone Encounter (Signed)
Pt call back about his prescription. ?

## 2022-02-16 NOTE — Telephone Encounter (Signed)
I dont understand    did we send to incorrect pharmacy ?  Barry Silva   please  clarify which pharmacy are we sending refill  to ? [facility] ? ( [facility] . )Also I reviewed this message today and not 3 days ago.

## 2022-02-17 ENCOUNTER — Other Ambulatory Visit: Payer: Self-pay | Admitting: Internal Medicine

## 2022-02-17 MED ORDER — AMPHETAMINE-DEXTROAMPHET ER 20 MG PO CP24: 20.0000 mg | ORAL_CAPSULE | Freq: Every day | ORAL | 0 refills | Status: DC

## 2022-02-17 NOTE — Telephone Encounter (Signed)
[facility] doesn't accept [employer] where the prescription for Adderall was sent to, so pt is wanting the Adderall sent to [facility] on [address].  Patient is now almost out of medication due to difficulty getting the prescription filled. ?

## 2022-02-18 LAB — NUCLEOTIDASE, 5', BLOOD: 5-Nucleotidase: 6 U/L (ref 0–10)

## 2022-02-18 LAB — MITOCHONDRIAL ANTIBODIES: Mitochondrial M2 Ab, IgG: <=20 U (ref <=20.0)

## 2022-02-18 NOTE — Progress Notes (Signed)
Liver US  shows a probably small cyst  but normal liver .    there is a non obstructing left kidney stone .   No  findings to explain lab abnormality ( this is good news   but needs to be followed)    ?Option 1  refer to GI to get opinion about follow up .  ?Option 2   repeat  lfts and GGT in 2-3 months .  ?Can make a fu visit or virtual  visit to  discuss further  if want to discuss more.

## 2022-02-21 ENCOUNTER — Encounter: Payer: Self-pay | Admitting: Internal Medicine

## 2022-02-21 ENCOUNTER — Ambulatory Visit (INDEPENDENT_AMBULATORY_CARE_PROVIDER_SITE_OTHER): Payer: 59 | Admitting: Internal Medicine

## 2022-02-21 VITALS — BP 122/78 | HR 98 | Temp 98.0°F | Ht 68.0 in | Wt 165.0 lb

## 2022-02-21 DIAGNOSIS — F908 Attention-deficit hyperactivity disorder, other type: Secondary | ICD-10-CM | POA: Diagnosis not present

## 2022-02-21 DIAGNOSIS — R748 Abnormal levels of other serum enzymes: Secondary | ICD-10-CM

## 2022-02-21 DIAGNOSIS — I10 Essential (primary) hypertension: Secondary | ICD-10-CM

## 2022-02-21 DIAGNOSIS — K7689 Other specified diseases of liver: Secondary | ICD-10-CM

## 2022-02-21 DIAGNOSIS — R69 Illness, unspecified: Secondary | ICD-10-CM | POA: Diagnosis not present
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
## 2022-02-21 NOTE — Patient Instructions (Addendum)
?  Will compose communication about results . ?This lab finding  ( elevated GGT)  is isolated  and is not diagnostic of a disease process.  ?At this time would follow .  ? ?CLINICAL DATA:  Abnormal LFTS ?  ?EXAM: ?ABDOMEN ULTRASOUND COMPLETE ?  ?COMPARISON:  None. ?  ?FINDINGS: ?Gallbladder: No gallstones or wall thickening visualized. No ?sonographic Murphy sign noted by sonographer. ?  ?Common bile duct: Diameter: 3 mm. ?  ?Liver: There is a 1.5 x 1.6 x 1.1 cm fluid dense lesion that likely ?represents a simple hepatic cyst. Within normal limits in ?parenchymal echogenicity. Portal vein is patent on color Doppler ?imaging with normal direction of blood flow towards the liver. ?  ?IVC: No abnormality visualized. ?  ?Pancreas: Visualized portion unremarkable. ?  ?Spleen: Size and appearance within normal limits. ?  ?Right Kidney: Length: 11.6 cm. Echogenicity within normal limits. ?There is a 1.1 x 1.1 x 1.1 cm right cystic lesion that likely ?represents a simple renal cyst. No solid mass or hydronephrosis ?visualized. ?  ?Left Kidney: Length: 11.9 cm. Echogenicity within normal limits. ?There is a 6 mm calcified stone. No solid mass or hydronephrosis ?visualized. ?  ?Abdominal aorta: No aneurysm visualized. ?  ?Other findings: None. ?  ?IMPRESSION: ?Nonobstructive 6 mm left nephrolithiasis. ?  ?  ?Electronically Signed ?  By: Iven Finn M.D. ? ? ? ? ?

## 2022-02-24 ENCOUNTER — Encounter: Payer: Self-pay | Admitting: Internal Medicine

## 2022-03-01 ENCOUNTER — Other Ambulatory Visit: Payer: Self-pay | Admitting: Internal Medicine

## 2022-04-07 ENCOUNTER — Telehealth: Payer: Self-pay | Admitting: Internal Medicine

## 2022-04-07 NOTE — Telephone Encounter (Signed)
Last Ov 02/21/22 ?Please advise ?

## 2022-04-07 NOTE — Telephone Encounter (Signed)
Pt requesting to pick up an actual hard prescription (not sent electronically) refill of amphetamine-dextroamphetamine (ADDERALL XR) 20 MG 24 hr capsule.  ? ?

## 2022-04-08 NOTE — Telephone Encounter (Signed)
Patient informed of message  

## 2022-04-11 ENCOUNTER — Telehealth: Payer: Self-pay

## 2022-04-11 MED ORDER — AMPHETAMINE-DEXTROAMPHET ER 20 MG PO CP24: 20.0000 mg | ORAL_CAPSULE | Freq: Every day | ORAL | 0 refills | Status: DC

## 2022-04-11 NOTE — Telephone Encounter (Signed)
Refill printed 

## 2022-04-11 NOTE — Telephone Encounter (Signed)
Patient informed Rx is ready for pick up

## 2022-04-11 NOTE — Telephone Encounter (Signed)
Patient informed of Rx prescription is ready for pick-up ?

## 2022-04-11 NOTE — Addendum Note (Signed)
Addended byBurnis Medin on: 04/11/2022 08:13 AM ? ? Modules accepted: Orders ? ?

## 2022-05-11 ENCOUNTER — Other Ambulatory Visit: Payer: Self-pay | Admitting: Internal Medicine

## 2022-05-11 MED ORDER — AMPHETAMINE-DEXTROAMPHET ER 20 MG PO CP24: 20.0000 mg | ORAL_CAPSULE | Freq: Every day | ORAL | 0 refills | Status: DC

## 2022-05-11 NOTE — Telephone Encounter (Signed)
Pt call and stated she need a refill on amphetamine-dextroamphetamine (ADDERALL XR) 20 MG 24 hr capsule  [facility]  [id number]  [phone] Fax:  [phone]     Phone:  [phone] Fax:  [phone]

## 2022-05-11 NOTE — Telephone Encounter (Signed)
Last Ov 02/21/2022 Filled 04/11/22 Is it ok to refill?

## 2022-05-11 NOTE — Addendum Note (Signed)
Addended byShanon Ace K on: 05/11/2022 01:45 PM   Modules accepted: Orders

## 2022-06-10 ENCOUNTER — Telehealth: Payer: Self-pay | Admitting: Internal Medicine

## 2022-06-10 DIAGNOSIS — R748 Abnormal levels of other serum enzymes: Secondary | ICD-10-CM

## 2022-06-10 NOTE — Telephone Encounter (Signed)
Refill requested   amphetamine-dextroamphetamine (ADDERALL XR) 20 MG 24 hr capsule

## 2022-06-14 MED ORDER — AMPHETAMINE-DEXTROAMPHET ER 20 MG PO CP24: 20.0000 mg | ORAL_CAPSULE | Freq: Every day | ORAL | 0 refills | Status: DC

## 2022-06-14 NOTE — Telephone Encounter (Addendum)
Sent electronically to [facility] although uncertain if this was the correct pharmacy to send it to.  Also tell patient that it is time to follow-up repeat LFTs to follow-up the abnormal he had about 4 months ago. Have him make a lab appointment

## 2022-06-15 NOTE — Telephone Encounter (Signed)
Patient informed Rx was sent to the pharmacy and to schedule lab visit

## 2022-07-12 ENCOUNTER — Other Ambulatory Visit: Payer: Self-pay | Admitting: Internal Medicine

## 2022-07-20 ENCOUNTER — Other Ambulatory Visit: Payer: Self-pay | Admitting: Internal Medicine

## 2022-07-22 NOTE — Telephone Encounter (Signed)
Last Office visit- 02/21/2022 Last refill- 06/14/22--30 caps, 0 refills  No future office visit scheduled.

## 2022-07-25 MED ORDER — AMPHETAMINE-DEXTROAMPHET ER 20 MG PO CP24
20.0000 mg | ORAL_CAPSULE | Freq: Every day | ORAL | 0 refills | Status: DC
Start: 2022-07-25 — End: 2022-08-24

## 2022-07-26 ENCOUNTER — Emergency Department (HOSPITAL_BASED_OUTPATIENT_CLINIC_OR_DEPARTMENT_OTHER): Payer: 59 | Admitting: Radiology

## 2022-07-26 ENCOUNTER — Emergency Department (HOSPITAL_BASED_OUTPATIENT_CLINIC_OR_DEPARTMENT_OTHER)
Admission: EM | Admit: 2022-07-26 | Discharge: 2022-07-27 | Disposition: A | Discharge status: Home / Self Care | Payer: 59 | Attending: Emergency Medicine | Admitting: Emergency Medicine

## 2022-07-26 ENCOUNTER — Encounter (HOSPITAL_BASED_OUTPATIENT_CLINIC_OR_DEPARTMENT_OTHER): Payer: Self-pay

## 2022-07-26 ENCOUNTER — Other Ambulatory Visit: Payer: Self-pay

## 2022-07-26 DIAGNOSIS — M79601 Pain in right arm: Secondary | ICD-10-CM | POA: Diagnosis not present

## 2022-07-26 DIAGNOSIS — Y92410 Unspecified street and highway as the place of occurrence of the external cause: Secondary | ICD-10-CM | POA: Insufficient documentation

## 2022-07-26 DIAGNOSIS — S50811A Abrasion of right forearm, initial encounter: Secondary | ICD-10-CM | POA: Diagnosis not present

## 2022-07-26 DIAGNOSIS — S40021A Contusion of right upper arm, initial encounter: Secondary | ICD-10-CM | POA: Diagnosis not present

## 2022-07-26 DIAGNOSIS — S40811A Abrasion of right upper arm, initial encounter: Secondary | ICD-10-CM | POA: Diagnosis not present

## 2022-07-26 DIAGNOSIS — M79603 Pain in arm, unspecified: Secondary | ICD-10-CM | POA: Diagnosis not present

## 2022-07-26 DIAGNOSIS — S4991XA Unspecified injury of right shoulder and upper arm, initial encounter: Secondary | ICD-10-CM | POA: Diagnosis not present
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
## 2022-07-27 MED ORDER — HYDROCODONE-ACETAMINOPHEN 5-325 MG PO TABS
2.0000 | ORAL_TABLET | Freq: Once | ORAL | Status: AC
  Administered 2022-07-27: 2 via ORAL
  Filled 2022-07-27: qty 2

## 2022-07-27 MED ORDER — BACITRACIN ZINC 500 UNIT/GM EX OINT
TOPICAL_OINTMENT | Freq: Two times a day (BID) | CUTANEOUS | Status: DC
  Filled 2022-07-27: qty 28.35

## 2022-07-27 MED ORDER — HYDROCODONE-ACETAMINOPHEN 5-325 MG PO TABS: 1.0000 | ORAL_TABLET | Freq: Four times a day (QID) | ORAL | 0 refills | Status: DC | PRN

## 2022-07-27 NOTE — ED Notes (Signed)
RN provided AVS using Teachback Method. Patient verbalizes understanding of Discharge Instructions. Opportunity for Questioning and Answers were provided by RN. Patient Discharged from ED ambulatory to Home with Family. ? ?

## 2022-07-27 NOTE — Discharge Instructions (Signed)
Take ibuprofen 600 mg every 6 hours as needed for pain.  Begin taking hydrocodone as prescribed as needed for pain not relieved with ibuprofen.  Ice for 20 minutes every 2 hours while awake for the next 2 days.  Wear the arm sling for comfort and support for the next several days, then slowly reintroduce activity as tolerated.  If not improving in the next few days, follow-up with your orthopedist.
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
## 2022-08-23 ENCOUNTER — Telehealth: Payer: Self-pay | Admitting: Internal Medicine

## 2022-08-23 NOTE — Telephone Encounter (Signed)
Pt called to request a refill of the following:  amphetamine-dextroamphetamine (ADDERALL XR) 20 MG 24 hr capsule  LVV:  03/23/21 LOV:  03/03/22 Lab:   02/09/22  Does MD want Pt to come in first?  Please advise.  [id number]  [phone] Fax:  [phone]

## 2022-08-24 ENCOUNTER — Other Ambulatory Visit: Payer: Self-pay | Admitting: Internal Medicine

## 2022-08-24 MED ORDER — AMPHETAMINE-DEXTROAMPHET ER 20 MG PO CP24: 20.0000 mg | ORAL_CAPSULE | Freq: Every day | ORAL | 0 refills | Status: DC

## 2022-08-24 NOTE — Telephone Encounter (Signed)
I will refill  x 1 month Have him get lab appt do do Followup  LFTS  and ggt ( (already in system)   Make fu appt in October November  in person

## 2022-08-25 NOTE — Telephone Encounter (Signed)
Spoke to patient. He is aware of refill has sent in. Lab and office visit appointment scheduled.

## 2022-08-30 ENCOUNTER — Other Ambulatory Visit (INDEPENDENT_AMBULATORY_CARE_PROVIDER_SITE_OTHER): Payer: 59

## 2022-08-30 DIAGNOSIS — R748 Abnormal levels of other serum enzymes: Secondary | ICD-10-CM

## 2022-08-30 LAB — HEPATIC FUNCTION PANEL
ALT: 29 U/L (ref 0–53)
AST: 22 U/L (ref 0–37)
Albumin: 4 g/dL (ref 3.5–5.2)
Alkaline Phosphatase: 77 U/L (ref 39–117)
Bilirubin, Direct: 0.2 mg/dL (ref 0.0–0.3)
Total Bilirubin: 1.3 mg/dL — ABNORMAL HIGH (ref 0.2–1.2)
Total Protein: 7 g/dL (ref 6.0–8.3)

## 2022-08-30 LAB — GAMMA GT: GGT: 78 U/L — ABNORMAL HIGH (ref 7–51)

## 2022-09-12 NOTE — Progress Notes (Signed)
Lab results about the same we will discuss at visit in November.  Need for follow-up

## 2022-09-21 ENCOUNTER — Telehealth: Payer: Self-pay | Admitting: Internal Medicine

## 2022-09-21 NOTE — Telephone Encounter (Signed)
Pt would like a refill on amphetamine-dextroamphetamine (ADDERALL XR) 20 MG 24 hr capsule  [id number]  [phone] Fax:  [phone]

## 2022-09-26 NOTE — Telephone Encounter (Signed)
Last OV 02/21/22 Last refill in chart 08/24/22

## 2022-09-27 ENCOUNTER — Other Ambulatory Visit: Payer: Self-pay

## 2022-09-27 MED ORDER — AMPHETAMINE-DEXTROAMPHET ER 20 MG PO CP24: 20.0000 mg | ORAL_CAPSULE | Freq: Every day | ORAL | 0 refills | Status: DC

## 2022-10-01 NOTE — Telephone Encounter (Signed)
This was refilled on October 10  has upcoming November visit

## 2022-10-01 NOTE — Progress Notes (Signed)
I refilled on October 10  let me know if there is a problem

## 2022-10-22 ENCOUNTER — Other Ambulatory Visit: Payer: Self-pay | Admitting: Internal Medicine

## 2022-10-24 ENCOUNTER — Telehealth: Payer: Self-pay

## 2022-10-24 DIAGNOSIS — S51001A Unspecified open wound of right elbow, initial encounter: Secondary | ICD-10-CM | POA: Diagnosis not present
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
## 2022-10-25 ENCOUNTER — Encounter: Payer: Self-pay | Admitting: Internal Medicine

## 2022-10-25 ENCOUNTER — Ambulatory Visit: Payer: 59 | Admitting: Internal Medicine

## 2022-10-25 VITALS — BP 148/98 | HR 81 | Temp 97.8°F | Ht 67.0 in | Wt 159.8 lb

## 2022-10-25 DIAGNOSIS — I10 Essential (primary) hypertension: Secondary | ICD-10-CM | POA: Diagnosis not present

## 2022-10-25 DIAGNOSIS — Z79899 Other long term (current) drug therapy: Secondary | ICD-10-CM

## 2022-10-25 DIAGNOSIS — R69 Illness, unspecified: Secondary | ICD-10-CM | POA: Diagnosis not present

## 2022-10-25 DIAGNOSIS — R748 Abnormal levels of other serum enzymes: Secondary | ICD-10-CM | POA: Diagnosis not present

## 2022-10-25 DIAGNOSIS — D869 Sarcoidosis, unspecified: Secondary | ICD-10-CM | POA: Diagnosis not present

## 2022-10-25 DIAGNOSIS — F908 Attention-deficit hyperactivity disorder, other type: Secondary | ICD-10-CM

## 2022-10-25 DIAGNOSIS — Z87828 Personal history of other (healed) physical injury and trauma: Secondary | ICD-10-CM | POA: Diagnosis not present

## 2022-10-25 DIAGNOSIS — Z0000 Encounter for general adult medical examination without abnormal findings: Secondary | ICD-10-CM

## 2022-10-25 DIAGNOSIS — Z125 Encounter for screening for malignant neoplasm of prostate: Secondary | ICD-10-CM

## 2022-10-25 LAB — CBC WITH DIFFERENTIAL/PLATELET
Basophils Absolute: 0.1 10*3/uL (ref 0.0–0.1)
Basophils Relative: 1.2 % (ref 0.0–3.0)
Eosinophils Absolute: 0.2 10*3/uL (ref 0.0–0.7)
Eosinophils Relative: 3.3 % (ref 0.0–5.0)
HCT: 46 % (ref 39.0–52.0)
Hemoglobin: 15.5 g/dL (ref 13.0–17.0)
Lymphocytes Relative: 20.6 % (ref 12.0–46.0)
Lymphs Abs: 1.4 10*3/uL (ref 0.7–4.0)
MCHC: 33.8 g/dL (ref 30.0–36.0)
MCV: 90.2 fl (ref 78.0–100.0)
Monocytes Absolute: 0.9 10*3/uL (ref 0.1–1.0)
Monocytes Relative: 12.9 % — ABNORMAL HIGH (ref 3.0–12.0)
Neutro Abs: 4.2 10*3/uL (ref 1.4–7.7)
Neutrophils Relative %: 62 % (ref 43.0–77.0)
Platelets: 286 10*3/uL (ref 150.0–400.0)
RBC: 5.1 Mil/uL (ref 4.22–5.81)
RDW: 12.7 % (ref 11.5–15.5)
WBC: 6.8 10*3/uL (ref 4.0–10.5)

## 2022-10-25 LAB — LIPID PANEL
Cholesterol: 209 mg/dL — ABNORMAL HIGH (ref 0–200)
HDL: 54.7 mg/dL (ref >39.00)
LDL Cholesterol: 127 mg/dL — ABNORMAL HIGH (ref 0–99)
NonHDL: 154.78
Total CHOL/HDL Ratio: 4
Triglycerides: 140 mg/dL (ref 0.0–149.0)
VLDL: 28 mg/dL (ref 0.0–40.0)

## 2022-10-25 LAB — PSA: PSA: 1.06 ng/mL (ref 0.10–4.00)

## 2022-10-25 LAB — BASIC METABOLIC PANEL
BUN: 12 mg/dL (ref 6–23)
CO2: 30 mEq/L (ref 19–32)
Calcium: 9.3 mg/dL (ref 8.4–10.5)
Chloride: 101 mEq/L (ref 96–112)
Creatinine, Ser: 0.82 mg/dL (ref 0.40–1.50)
GFR: 96.09 mL/min (ref >60.00)
Glucose, Bld: 87 mg/dL (ref 70–99)
Potassium: 3.9 mEq/L (ref 3.5–5.1)
Sodium: 137 mEq/L (ref 135–145)

## 2022-10-25 LAB — HEPATIC FUNCTION PANEL
ALT: 43 U/L (ref 0–53)
AST: 32 U/L (ref 0–37)
Albumin: 4.2 g/dL (ref 3.5–5.2)
Alkaline Phosphatase: 89 U/L (ref 39–117)
Bilirubin, Direct: 0.2 mg/dL (ref 0.0–0.3)
Total Bilirubin: 1.6 mg/dL — ABNORMAL HIGH (ref 0.2–1.2)
Total Protein: 6.7 g/dL (ref 6.0–8.3)

## 2022-10-25 LAB — GAMMA GT: GGT: 88 U/L — ABNORMAL HIGH (ref 7–51)

## 2022-10-25 LAB — HEMOGLOBIN A1C: Hgb A1c MFr Bld: 5.4 % (ref 4.6–6.5)

## 2022-10-25 MED ORDER — AMLODIPINE BESYLATE-VALSARTAN 5-160 MG PO TABS: 1.0000 | ORAL_TABLET | Freq: Every day | ORAL | 1 refills | Status: DC

## 2022-10-25 MED ORDER — AMPHETAMINE-DEXTROAMPHET ER 15 MG PO CP24: 15.0000 mg | ORAL_CAPSULE | ORAL | 0 refills | Status: DC

## 2022-10-25 NOTE — Patient Instructions (Signed)
Good to see you  Changing BP medication one pill  2 ingredients Try adderaal xr 15 mg  will send in to pharmacy today. Advise see  ortho about left shoulder predicament.  Condolences about your dad passing   Plan  fu visit 3 months for BP control  meds etc .

## 2022-10-25 NOTE — Progress Notes (Signed)
Chief Complaint  Patient presents with   Annual Exam    HPI: Patient  Barry Silva  61 y.o. comes in today for [place] visit  and CDM and meds  Interim hx   Fell x 2 riding this weekend  elbow   bandaged .  Bruised  shoulder ( mountain Biking)  Father just passes  age 42 was in nursing home [place]  BP:  never normal in am   but  othewise ok at goal taking amlodipine and 50 of losrrtan ( 100 mg causes previous injured  ankle to hurt?) ADD/ADHD  med still helps,  can  try decrease to 15 xr adderal  sarcoid     no resp sx  Left shoulder  after lifting   shingles this summer  ? Popped  previous  injury ad separation no surgery  still having issues  , scraped this weekend but not related . Some pain with full rom  Not currently taking meloxicam or nsaids   Health Maintenance  Topic Date Due   HIV Screening  Never done   Zoster Vaccines- Shingrix (1 of 2) Never done   COLONOSCOPY (Pts 45-43yr Insurance coverage will need to be confirmed)  05/09/2019   COVID-19 Vaccine (4 - Pfizer risk series) 11/10/2022 (Originally 01/06/2021)   INFLUENZA VACCINE  03/19/2023 (Originally 07/19/2022)   TETANUS/TDAP  10/17/2027   Hepatitis C Screening  Completed   HPV VACCINES  Aged Out   Health Maintenance Review LIFESTYLE:  Exercise:  mountain bike very active  Tobacco/ETS: no Alcohol:  3 xper week  Sugar beverages: not reg  Sleep: 6-8 hours  Drug use: no HH of  3   cat  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 [employer]   No cv  pulm sx at this time  REST of 12 system review negative except as per HPI   Past Medical History:  Diagnosis Date   Abnormal LFTs 12/01/2014   neg serologies avoid reg liver toxins and repeat in 2 months consider abd [place] other labs if needed    ADD (attention deficit disorder with hyperactivity)    formal evaluation   Basal cell carcinoma (BCC) 10/2019   nasal bridge   Complication of anesthesia    so cold -  was worse than pain from injury- 10/26/18- this was not the an issue   Fracture    femur 1984 residual nerve damage leg leg   History of kidney stones    Hypertension    Migraine    MIGRAINE HEADACHE 08/30/2007   Qualifier: Diagnosis of  By: TSherren Mocha RN, EDorian Pod    Nephrolithiasis    hx of episode 1/10 calcium stones   OSA (obstructive sleep apnea)    mild, Apnealink 7 2011 RDI7, O2 nadir 83%   Refusal of blood transfusions as patient is Jehovah's Witness    Sarcoidosis    bronchoscopy 2006, mtx started 04/19/10 try off 11/29/10 (atypical cp/ ? worsening ct chest [facility]) off prednisone 7/11    Past Surgical History:  Procedure Laterality Date   BRONCHOSCOPY  2006   COLONOSCOPY     EXTERNAL FIXATION LEG Bilateral 10/26/2018   Procedure: LEFT LEGEXTERNAL FIXATION ..Marland KitchenPLINT CHANGE RIGHT LEG;  Surgeon: HShona Needles MD;  [place] [facility]  Service: Orthopedics;  Laterality: Bilateral;   EXTERNAL FIXATION REMOVAL  11/02/2018   Procedure: REMOVAL EXTERNAL FIXATION LEG;  Surgeon: HShona Needles MD;  Location: [facility];  Service: Orthopedics;;   HARDWARE REMOVAL Right 06/04/2019   Procedure: RIGHT FOOT HARDWARE REMOVAL;  Surgeon: Erle Crocker, MD;  Location: [place];  Service: Orthopedics;  Laterality: Right;   HERNIA REPAIR     age 38   KIDNEY STONE SURGERY     cystocopy   [place]   getting knee to bend- had a previous fracture   OPEN REDUCTION INTERNAL FIXATION (ORIF) TIBIA/FIBULA FRACTURE Left 11/02/2018   Procedure: OPEN REDUCTION INTERNAL FIXATION (ORIF) LEFT PILON FRACTURE;  Surgeon: Shona Needles, MD;  Location: [facility];  Service: Orthopedics;  Laterality: Left;   ORIF CALCANEOUS FRACTURE Right 11/02/2018   Procedure: OPEN REDUCTION INTERNAL FIXATION (ORIF) CALCANEOUS FRACTURE;  Surgeon: Shona Needles, MD;  Location: [facility];  Service: Orthopedics;  Laterality: Right;   TENDON EXPLORATION Right 06/04/2019   Procedure: EXPLORATION AND REPAIR   PERONEAL TENDONS;  Surgeon: Erle Crocker, MD;  Location: [facility];  Service: Orthopedics;  Laterality: Right;   XI ROBOTIC ASSISTED INGUINAL HERNIA REPAIR WITH MESH Left 11/19/2020   Procedure: XI ROBOTIC ASSISTED LEFT INGUINAL HERNIA REPAIR WITH MESH;  Surgeon: Greer Pickerel, MD;  Location: [facility];  Service: General;  Laterality: Left;    Family History  Problem Relation Age of Onset   Stroke Other        Grandfather father older age   Colon cancer Neg Hx    Esophageal cancer Neg Hx    Rectal cancer Neg Hx    Stomach cancer Neg Hx     Social History   Socioeconomic History   Marital status: Married    Spouse name: Not on file   Number of children: Not on file   Years of education: Not on file   Highest education level: Not on file  Occupational History   Not on file  Tobacco Use   Smoking status: Never   Smokeless tobacco: Never  Vaping Use   Vaping Use: Never used  Substance and Sexual Activity   Alcohol use: Yes    Alcohol/week: 6.0 standard drinks of alcohol    Types: 6 Glasses of wine per week    Comment: socially- 11/01/18- none this week   Drug use: No   Sexual activity: Not on file  Other Topics Concern   Not on file  Social History Narrative   Married   George of 2-3   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   Family in [place]   No tobacco or ETS   Social Determinants of Radio broadcast assistant Strain: Not on file  Food Insecurity: Not on file  Transportation Needs: Not on file  Physical Activity: Not on file  Stress: Not on file  Social Connections: Not on file    Outpatient Medications Prior to Visit  Medication Sig Dispense Refill   Ascorbic Acid (VITAMIN C) 1000 MG tablet Take 1,000 mg by mouth 3 (three) times a week.      CANNABIDIOL PO Take 50 mLs by mouth daily. A drop at bedtime     IBUPROFEN PO Take by mouth. At bed time     amLODipine (NORVASC) 5 MG tablet TAKE 1 TABLET BY MOUTH EVERY DAY 90 tablet 0    amphetamine-dextroamphetamine (ADDERALL XR) 20 MG 24 hr capsule Take 1 capsule (20 mg total) by mouth daily. 30 capsule 0   losartan (COZAAR) 50 MG tablet TAKE 2 TABLETS BY MOUTH  EVERY DAY 180 tablet 1   HYDROcodone-acetaminophen (NORCO) 5-325 MG tablet Take 1-2 tablets by mouth every 6 (six) hours as needed. 20 tablet 0   meloxicam (MOBIC) 15 MG tablet Take 1 tablet (15 mg total) by mouth daily as needed for pain. (Patient not taking: Reported on 02/21/2022) 30 tablet 1   meloxicam (MOBIC) 15 MG tablet Take 15 mg by mouth 3 (three) times daily.     No facility-administered medications prior to visit.     EXAM:  BP (!) 148/98 (BP Location: Left Arm, Cuff Size: Normal)   Pulse 81   Temp 97.8 F (36.6 C) (Oral)   Ht '5\' 7"'$  (1.702 m)   Wt 159 lb 12.8 oz (72.5 kg)   SpO2 98%   BMI 25.03 kg/m   Body mass index is 25.03 kg/m. Wt Readings from Last 3 Encounters:  10/25/22 159 lb 12.8 oz (72.5 kg)  07/26/22 160 lb (72.6 kg)  02/21/22 165 lb (74.8 kg)    Physical Exam: Vital signs reviewed UDJ:SHFW is a well-developed well-nourished alert cooperative    who appearsr stated age in no acute distress.  HEENT: normocephalic atraumatic , Eyes: PERRL EOM's full, conjunctiva clear, Nares: paten,t no deformity discharge or tenderness., Ears: no deformity EAC's clear TMs with normal landmarks. Mouth: clear OP, no lesions, edema.  Moist mucous membranes. Dentition in adequate repair. NECK: supple without masses, thyromegaly or bruits. CHEST/PULM:  Clear to auscultation and percussion breath sounds equal no wheeze , rales or rhonchi. No chest wall deformities or tenderness. CV: PMI is nondisplaced, S1 S2 no gallops, murmurs, rubs. Peripheral pulses are full without delay.No JVD .  ABDOMEN: Bowel sounds normal nontender  No guard or rebound, no hepato splenomegal no CVA tenderness.  Extremtities:  No clubbing cyanosis or edema, no acute joint swelling or redness no focal atrophy bandage r elbow  left  shoulder pain on full rom abrasion deltoid area   nl muscle mass  NEURO:  Oriented x3, cranial nerves 3-12 appear to be intact, no obvious focal weakness,gait within normal limits  SKIN: No acute rashes normal turgor, color, no bruising or petechiae. Abrasions  PSYCH: Oriented, good eye contact, no obvious depression anxiety, cognition and judgment appear normal. LN: no cervical axillary adenopathy  Lab Results  Component Value Date   WBC 6.8 10/25/2022   HGB 15.5 10/25/2022   HCT 46.0 10/25/2022   PLT 286.0 10/25/2022   GLUCOSE 87 10/25/2022   CHOL 209 (H) 10/25/2022   TRIG 140.0 10/25/2022   HDL 54.70 10/25/2022   LDLDIRECT 142.6 05/14/2013   LDLCALC 127 (H) 10/25/2022   ALT 43 10/25/2022   AST 32 10/25/2022   NA 137 10/25/2022   K 3.9 10/25/2022   CL 101 10/25/2022   CREATININE 0.82 10/25/2022   BUN 12 10/25/2022   CO2 30 10/25/2022   TSH 1.45 10/21/2019   PSA 1.06 10/25/2022   INR 0.96 11/02/2018   HGBA1C 5.4 10/25/2022    BP Readings from Last 3 Encounters:  10/25/22 (!) 148/98  07/27/22 (!) 176/108  02/21/22 122/78    Lab plan  reviewed with patient   ASSESSMENT AND PLAN:  Discussed the following assessment and plan:    ICD-10-CM   1. Visit for preventive health examination  Y63.78 Basic metabolic panel    CBC with Differential/Platelet    Hemoglobin A1c    Hepatic function panel    Lipid panel    PSA    Gamma GT    Gamma GT  PSA    Lipid panel    Hepatic function panel    Hemoglobin A1c    CBC with Differential/Platelet    Basic metabolic panel    2. Medication management  A35.573 Basic metabolic panel    CBC with Differential/Platelet    Hemoglobin A1c    Hepatic function panel    Lipid panel    PSA    Gamma GT    Gamma GT    PSA    Lipid panel    Hepatic function panel    Hemoglobin A1c    CBC with Differential/Platelet    Basic metabolic panel    3. Elevated serum GGT level  U20.2 Basic metabolic panel    CBC with  Differential/Platelet    Hemoglobin A1c    Hepatic function panel    Lipid panel    Gamma GT    Gamma GT    Lipid panel    Hepatic function panel    Hemoglobin A1c    CBC with Differential/Platelet    Basic metabolic panel   uncertain cause  poss from sarcoid?    4. Essential hypertension  R42 Basic metabolic panel    CBC with Differential/Platelet    Hemoglobin A1c    Hepatic function panel    Lipid panel    Gamma GT    Gamma GT    Lipid panel    Hepatic function panel    Hemoglobin A1c    CBC with Differential/Platelet    Basic metabolic panel   change to exforge assess control on 3 mos    5. Attention deficit hyperactivity disorder (ADHD), other type  H06.2 Basic metabolic panel    CBC with Differential/Platelet    Hemoglobin A1c    Hepatic function panel    Lipid panel    PSA    Gamma GT    Gamma GT    PSA    Lipid panel    Hepatic function panel    Hemoglobin A1c    CBC with Differential/Platelet    Basic metabolic panel   trial lower dose  to 15 adderall xr    6. Screening PSA (prostate specific antigen)  Z12.5 PSA    PSA    7. Sarcoidosis  B76.2 Basic metabolic panel    CBC with Differential/Platelet    Hemoglobin A1c    Hepatic function panel    Lipid panel    PSA    Gamma GT    Gamma GT    PSA    Lipid panel    Hepatic function panel    Hemoglobin A1c    CBC with Differential/Platelet    Basic metabolic panel    8. History of traumatic injury to musculoskeletal system  Z87.828      Return in about 3 months (around 01/25/2023) for bp medicatoin  etc.  Patient Care Team: Burnis Medin, MD as PCP - General (Internal Medicine) Chesley Mires, MD (Pulmonary Disease) Pyrtle, Lajuan Lines, MD as Consulting Physician (Gastroenterology) Patient Instructions  Good to see you  Changing BP medication one pill  2 ingredients Try adderaal xr 15 mg  will send in to pharmacy today. Advise see  ortho about left shoulder predicament.  Condolences about your  dad passing   Plan  fu visit 3 months for BP control  meds etc .   Standley Brooking. Jeramia Saleeby M.D.

## 2022-10-30 NOTE — Progress Notes (Signed)
GGt about the same. Should be followed .  Cholesterol slight improvement. No diabetes  Can review at fu for BP  control  The 10-year ASCVD risk score (Arnett DK, et al., 2019) is: 11.5%   Values used to calculate the score:     Age: 61 years     Sex: Male     Is Non-Hispanic African American: No     Diabetic: No     Tobacco smoker: No     Systolic Blood Pressure: 570 mmHg     Is BP treated: Yes     HDL Cholesterol: 54.7 mg/dL     Total Cholesterol: 209 mg/dL

## 2022-11-22 DIAGNOSIS — L814 Other melanin hyperpigmentation: Secondary | ICD-10-CM | POA: Diagnosis not present

## 2022-11-22 DIAGNOSIS — D1801 Hemangioma of skin and subcutaneous tissue: Secondary | ICD-10-CM | POA: Diagnosis not present

## 2022-11-22 DIAGNOSIS — L570 Actinic keratosis: Secondary | ICD-10-CM | POA: Diagnosis not present

## 2022-11-22 DIAGNOSIS — L821 Other seborrheic keratosis: Secondary | ICD-10-CM | POA: Diagnosis not present

## 2022-11-29 ENCOUNTER — Telehealth: Payer: Self-pay | Admitting: Internal Medicine

## 2022-11-29 MED ORDER — AMPHETAMINE-DEXTROAMPHET ER 15 MG PO CP24: 15.0000 mg | ORAL_CAPSULE | ORAL | 0 refills | Status: DC

## 2022-11-29 NOTE — Telephone Encounter (Signed)
Dr. Velora Mediate patient.  Last refill--10/25/22-30 tabs, 0 refills Last OV-10/25/22  Pharmacy updated

## 2022-11-29 NOTE — Telephone Encounter (Signed)
Refill amphetamine-dextroamphetamine (ADDERALL XR) 15 MG 24 hr capsule  [id number] [address]: [phone] Fax: [phone]

## 2022-12-27 ENCOUNTER — Telehealth: Payer: Self-pay | Admitting: Internal Medicine

## 2022-12-27 MED ORDER — AMPHETAMINE-DEXTROAMPHET ER 15 MG PO CP24: 15.0000 mg | ORAL_CAPSULE | ORAL | 0 refills | Status: DC

## 2022-12-27 NOTE — Telephone Encounter (Signed)
Pt called to request a refill of the:  amphetamine-dextroamphetamine (ADDERALL XR) 15 MG 24 hr capsule   Pt is aware that MD is OOO.  LOV: 10/25/22  Please advise.  [id number] [phone] Fax: [phone]

## 2022-12-27 NOTE — Addendum Note (Signed)
Addended by: Apolinar Junes on: 12/27/2022 03:24 PM   Modules accepted: Orders

## 2022-12-27 NOTE — Telephone Encounter (Signed)
Last refill per ChL: 11/29/22 by cory Last OV: 10/25/22 Next OV: 01/25/23

## 2022-12-30 ENCOUNTER — Other Ambulatory Visit: Payer: Self-pay | Admitting: Internal Medicine

## 2023-01-19 DIAGNOSIS — M9904 Segmental and somatic dysfunction of sacral region: Secondary | ICD-10-CM | POA: Diagnosis not present

## 2023-01-19 DIAGNOSIS — M4607 Spinal enthesopathy, lumbosacral region: Secondary | ICD-10-CM | POA: Diagnosis not present

## 2023-01-19 DIAGNOSIS — M62830 Muscle spasm of back: Secondary | ICD-10-CM | POA: Diagnosis not present

## 2023-01-19 DIAGNOSIS — M9902 Segmental and somatic dysfunction of thoracic region: Secondary | ICD-10-CM | POA: Diagnosis not present

## 2023-01-25 ENCOUNTER — Ambulatory Visit: Payer: 59 | Admitting: Internal Medicine

## 2023-01-30 ENCOUNTER — Ambulatory Visit: Payer: 59 | Admitting: Internal Medicine

## 2023-02-01 ENCOUNTER — Telehealth: Payer: Self-pay | Admitting: Internal Medicine

## 2023-02-01 MED ORDER — AMPHETAMINE-DEXTROAMPHET ER 15 MG PO CP24: 15.0000 mg | ORAL_CAPSULE | ORAL | 0 refills | Status: DC

## 2023-02-01 NOTE — Telephone Encounter (Signed)
I sent in refill disp 30

## 2023-02-01 NOTE — Addendum Note (Signed)
Addended byBurnis Medin on: 02/01/2023 01:38 PM   Modules accepted: Orders

## 2023-02-01 NOTE — Telephone Encounter (Signed)
Prescription Request  02/01/2023  Is this a "Controlled Substance" medicine? Yes  LOV: 10/25/2022  What is the name of the medication or equipment? amphetamine-dextroamphetamine (ADDERALL XR) 15 MG 24 hr capsule   Have you contacted your pharmacy to request a refill? No   Which pharmacy would you like this sent to?  [id number] [address]: [phone]: [phone]   Patient notified that their request is being sent to the clinical staff for review and that they should receive a response within 2 business days.   Please advise at Mobile [phone](mobile)

## 2023-02-23 ENCOUNTER — Ambulatory Visit: Payer: 59 | Admitting: Internal Medicine

## 2023-03-15 ENCOUNTER — Encounter: Payer: Self-pay | Admitting: Internal Medicine

## 2023-03-15 ENCOUNTER — Ambulatory Visit (INDEPENDENT_AMBULATORY_CARE_PROVIDER_SITE_OTHER): Payer: 59 | Admitting: Internal Medicine

## 2023-03-15 VITALS — BP 124/80 | HR 89 | Temp 97.9°F | Ht 67.0 in | Wt 168.2 lb

## 2023-03-15 DIAGNOSIS — Z79899 Other long term (current) drug therapy: Secondary | ICD-10-CM | POA: Diagnosis not present

## 2023-03-15 DIAGNOSIS — I10 Essential (primary) hypertension: Secondary | ICD-10-CM

## 2023-03-15 DIAGNOSIS — N200 Calculus of kidney: Secondary | ICD-10-CM | POA: Diagnosis not present

## 2023-03-15 DIAGNOSIS — Z87442 Personal history of urinary calculi: Secondary | ICD-10-CM

## 2023-03-15 DIAGNOSIS — R198 Other specified symptoms and signs involving the digestive system and abdomen: Secondary | ICD-10-CM | POA: Diagnosis not present

## 2023-03-15 DIAGNOSIS — R748 Abnormal levels of other serum enzymes: Secondary | ICD-10-CM | POA: Insufficient documentation

## 2023-03-15 DIAGNOSIS — R109 Unspecified abdominal pain: Secondary | ICD-10-CM | POA: Diagnosis not present

## 2023-03-15 DIAGNOSIS — F908 Attention-deficit hyperactivity disorder, other type: Secondary | ICD-10-CM | POA: Diagnosis not present

## 2023-03-15 MED ORDER — AMPHETAMINE-DEXTROAMPHET ER 20 MG PO CP24: 20.0000 mg | ORAL_CAPSULE | ORAL | 0 refills | Status: DC

## 2023-03-15 NOTE — Assessment & Plan Note (Signed)
Inc back up to adderall xr 20 for better effect

## 2023-03-15 NOTE — Patient Instructions (Addendum)
Ordering  ct  kidneys for stones  r/o obstruction.  Inc adderall xr to  20 mg for now.  Continue to eat light  but if  persistent or progressive then may revisit causes. Lab work Social research officer, government.

## 2023-03-15 NOTE — Assessment & Plan Note (Signed)
Better bp today on exforge  switch  no se noted continue.

## 2023-03-15 NOTE — Assessment & Plan Note (Signed)
Last [place]  6 mm left kdney on abd [place] NO order ct renal today r/o obst with unusual sx

## 2023-03-15 NOTE — Progress Notes (Signed)
Chief Complaint  Patient presents with   Medical Management of Chronic Issues    Follow up on BP    HPI: Barry Silva 62 y.o. come in for Chronic disease management  Fu  meds  Passes stone thinks another in bladder .  No current pain but had lower flank pain ? R and left  Mountain biking 6 days ago . Had stomach   "not draining" nausea and diarrhea  vomiting next day and felt better .  Next day  passed kidney stone   not pre pain 1-2 mm  but still  abd discomfort  not really pain .[name]  not seen blood  Stool was dark black after pepto bismol but now ok and eating better  BP med change :   ok at night .  ADHDi to 15 mg asks to go back up to 20 .  On heavy  paper work days   "never had a normal ct  always stones "  last full stone pain urologyu about 10 + years ago.  Stays hydrated to avoid   No fever hematuria current vomiting   ROS: See pertinent positives and negatives per HPI.  Past Medical History:  Diagnosis Date   Abnormal LFTs 12/01/2014   neg serologies avoid reg liver toxins and repeat in 2 months consider abd [place]  other labs if needed    ADD (attention deficit disorder with hyperactivity)    formal evaluation   Basal cell carcinoma (BCC) 10/2019   nasal bridge   Complication of anesthesia    so cold - was worse than pain from injury- 10/26/18- this was not the an issue   Fracture    femur 1984 residual nerve damage leg leg   History of kidney stones    Hypertension    Migraine    MIGRAINE HEADACHE 08/30/2007   Qualifier: Diagnosis of  By: Sherren Mocha, RN, Dorian Pod     Nephrolithiasis    hx of episode 1/10 calcium stones   OSA (obstructive sleep apnea)    mild, Apnealink 7 2011 RDI7, O2 nadir 83%   Refusal of blood transfusions as patient is Jehovah's Witness    Sarcoidosis    bronchoscopy 2006, mtx started 04/19/10 try off 11/29/10 (atypical cp/ ? worsening ct chest [facility]) off prednisone 7/11    Family History  Problem Relation Age of Onset   Stroke Other        Grandfather  father older age   Colon cancer Neg Hx    Esophageal cancer Neg Hx    Rectal cancer Neg Hx    Stomach cancer Neg Hx     Social History   Socioeconomic History   Marital status: Married    Spouse name: Not on file   Number of children: Not on file   Years of education: Not on file   Highest education level: Not on file  Occupational History   Not on file  Tobacco Use   Smoking status: Never   Smokeless tobacco: Never  Vaping Use   Vaping Use: Never used  Substance and Sexual Activity   Alcohol use: Yes    Alcohol/week: 6.0 standard drinks of alcohol    Types: 6 Glasses of wine per week    Comment: socially- 11/01/18- none this week   Drug use: No   Sexual activity: Not on file  Other Topics Concern   Not on file  Social History Narrative   Married   Doyle of 2-3   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   Family in [place]   No tobacco or ETS   Social Determinants of Radio broadcast assistant Strain: Not on file  Food Insecurity: Not on file  Transportation Needs: Not on file  Physical Activity: Not on file  Stress: Not on file  Social Connections: Not on file    Outpatient Medications Prior to Visit  Medication Sig Dispense Refill   amLODipine-valsartan (EXFORGE) 5-160 MG tablet Take 1 tablet by mouth daily. Medication change dc amlodipine and losartan 90 tablet 1   Ascorbic Acid (VITAMIN C) 1000 MG tablet Take 1,000 mg by mouth 3 (three) times a week.      CANNABIDIOL PO Take 50 mLs by mouth daily. A drop at bedtime     IBUPROFEN PO Take by mouth. At bed time     amphetamine-dextroamphetamine (ADDERALL XR) 15 MG 24 hr capsule Take 1 capsule by mouth every morning. Dosage change 30 capsule 0   No facility-administered medications prior to visit.     EXAM:  BP 124/80 (BP Location: Right Arm, Patient Position: Sitting, Cuff Size: Large)   Pulse 89   Temp 97.9 F (36.6 C) (Oral)   Ht 5\' 7"  (1.702 m)   Wt 168 lb 3.2 oz (76.3 kg)   SpO2 98%   BMI  26.34 kg/m   Body mass index is 26.34 kg/m.  GENERAL: vitals reviewed and listed above, alert, oriented, appears well hydrated and in no acute distress HEENT: atraumatic, conjunctiva  clear, no obvious abnormalities on inspection of external nose and ears NECK: no obvious masses on inspection palpation  LUNGS: clear to auscultation bilaterally, no wheezes, rales or rhonchi, good air movement CV: HRRR, no clubbing cyanosis or  peripheral edema nl cap refill  Abdomen:  Sof,t normal bowel sounds without hepatosplenomegaly, no guarding rebound or masses no CVA tenderness  points to  flank area of prev area of pain r and left  MS: moves all extremities without noticeable focal  abnormality PSYCH: pleasant and cooperative, no obvious depression or anxiety Unable to give ua  went before visits Lab Results  Component Value Date   WBC 6.8 10/25/2022   HGB 15.5 10/25/2022   HCT 46.0 10/25/2022   PLT 286.0 10/25/2022   GLUCOSE 87 10/25/2022   CHOL 209 (H) 10/25/2022   TRIG 140.0 10/25/2022   HDL 54.70 10/25/2022   LDLDIRECT 142.6 05/14/2013   LDLCALC 127 (H) 10/25/2022   ALT 43 10/25/2022   AST 32 10/25/2022   NA 137 10/25/2022   K 3.9 10/25/2022   CL 101 10/25/2022   CREATININE 0.82 10/25/2022   BUN 12 10/25/2022   CO2 30 10/25/2022   TSH 1.45 10/21/2019   PSA 1.06 10/25/2022   INR 0.96 11/02/2018   HGBA1C 5.4 10/25/2022   BP Readings from Last 3 Encounters:  03/15/23 124/80  10/25/22 (!) 148/98  07/27/22 (!) 176/108    ASSESSMENT AND PLAN:  Discussed the following assessment and plan:  Essential hypertension  Abdominal pain, unspecified abdominal location - Plan: CT RENAL STONE STUDY  Renal stones - Plan: CT RENAL STONE STUDY  Medication management  Attention deficit hyperactivity disorder (ADHD), other type  Hypertension, unspecified type  History of kidney stones  GI symptom Essential hypertension  Abdominal pain, unspecified abdominal location -     CT  RENAL STONE STUDY; Future  Renal stones -     CT RENAL STONE STUDY; Future  Medication management  Attention deficit hyperactivity disorder (  ADHD), other type Assessment & Plan: Inc back up to adderall xr 20 for better effect    Hypertension, unspecified type Assessment & Plan: Better bp today on exforge  switch  no se noted continue.   History of kidney stones Assessment & Plan: Last [place]  6 mm left kdney on abd [place] NO order ct renal today r/o obst with unusual sx    GI symptom  Other orders -     Amphetamine-Dextroamphet ER; Take 1 capsule (20 mg total) by mouth every morning.  Dispense: 30 capsule; Refill: 0    -Patient advised to return or notify health care team  if  new concerns arise.  Patient Instructions  Ordering  ct  kidneys for stones  r/o obstruction.  Inc adderall xr to  20 mg for now.  Continue to eat light  but if  persistent or progressive then may revisit causes. Lab work Social research officer, government.    Mariann Laster K. Briony Parveen M.D.

## 2023-03-22 ENCOUNTER — Ambulatory Visit
Admission: RE | Admit: 2023-03-22 | Discharge: 2023-03-22 | Disposition: A | Discharge status: Home / Self Care | Payer: 59 | Source: Ambulatory Visit | Attending: Internal Medicine | Admitting: Internal Medicine

## 2023-03-22 DIAGNOSIS — Z87448 Personal history of other diseases of urinary system: Secondary | ICD-10-CM | POA: Diagnosis not present

## 2023-03-22 DIAGNOSIS — M9902 Segmental and somatic dysfunction of thoracic region: Secondary | ICD-10-CM | POA: Diagnosis not present

## 2023-03-22 DIAGNOSIS — K661 Hemoperitoneum: Secondary | ICD-10-CM | POA: Diagnosis not present

## 2023-03-22 DIAGNOSIS — M25412 Effusion, left shoulder: Secondary | ICD-10-CM | POA: Diagnosis not present

## 2023-03-22 DIAGNOSIS — N132 Hydronephrosis with renal and ureteral calculous obstruction: Secondary | ICD-10-CM | POA: Diagnosis not present

## 2023-03-22 DIAGNOSIS — M25512 Pain in left shoulder: Secondary | ICD-10-CM | POA: Diagnosis not present

## 2023-03-22 DIAGNOSIS — N200 Calculus of kidney: Secondary | ICD-10-CM

## 2023-03-22 DIAGNOSIS — M9904 Segmental and somatic dysfunction of sacral region: Secondary | ICD-10-CM | POA: Diagnosis not present

## 2023-03-22 DIAGNOSIS — M62830 Muscle spasm of back: Secondary | ICD-10-CM | POA: Diagnosis not present

## 2023-03-22 DIAGNOSIS — M9907 Segmental and somatic dysfunction of upper extremity: Secondary | ICD-10-CM | POA: Diagnosis not present

## 2023-03-22 DIAGNOSIS — K4020 Bilateral inguinal hernia, without obstruction or gangrene, not specified as recurrent: Secondary | ICD-10-CM | POA: Diagnosis not present

## 2023-03-22 DIAGNOSIS — R109 Unspecified abdominal pain: Secondary | ICD-10-CM

## 2023-03-27 ENCOUNTER — Telehealth: Payer: Self-pay

## 2023-03-27 DIAGNOSIS — N201 Calculus of ureter: Secondary | ICD-10-CM | POA: Diagnosis not present

## 2023-03-27 NOTE — Telephone Encounter (Signed)
Received an approval notification from [facility] healthcare that they approved for CT.   Valid from 03/16/2023 to 09/12/2023.

## 2023-03-27 NOTE — Progress Notes (Signed)
So scan shows kidney stones as expected but with some mild obstruction (incidentally  cholesterol plaque building up in the aorta not causing  the sx )  I suggest we have you see urology    also consider adding cholesterol medication. Please do urology referral if agrees and then   if agrees  send in crestor 10 mg to take  daily  dips 90  refill x 1   repeat FLP   lfts in 3-4 months

## 2023-04-03 ENCOUNTER — Other Ambulatory Visit: Payer: Self-pay

## 2023-04-03 DIAGNOSIS — N201 Calculus of ureter: Secondary | ICD-10-CM | POA: Diagnosis not present

## 2023-04-03 DIAGNOSIS — Z79899 Other long term (current) drug therapy: Secondary | ICD-10-CM

## 2023-04-03 MED ORDER — ROSUVASTATIN CALCIUM 10 MG PO TABS: 10.0000 mg | ORAL_TABLET | Freq: Every day | ORAL | 1 refills | Status: DC

## 2023-04-04 ENCOUNTER — Other Ambulatory Visit: Payer: Self-pay | Admitting: Urology

## 2023-04-04 DIAGNOSIS — N201 Calculus of ureter: Secondary | ICD-10-CM | POA: Diagnosis not present

## 2023-04-04 NOTE — Progress Notes (Signed)
Anesthesia Review:  PCP: Berniece Andreas- LOV 03/15/23  Cardiologist : Chest x-ray : EKG : Echo : Stress test: Cardiac Cath :  Activity level:  Sleep Study/ CPAP : Fasting Blood Sugar :      / Checks Blood Sugar -- times a day:   Blood Thinner/ Instructions /Last Dose: ASA / Instructions/ Last Dose :

## 2023-04-04 NOTE — Patient Instructions (Signed)
SURGICAL WAITING ROOM VISITATION  Patients having surgery or a procedure may have no more than 2 support people in the waiting area - these visitors may rotate.    Children under the age of 9 must have an adult with them who is not the patient.  Due to an increase in RSV and influenza rates and associated hospitalizations, children ages 10 and under may not visit patients in [facility] hospitals.  If the patient needs to stay at the hospital during part of their recovery, the visitor guidelines for inpatient rooms apply. Pre-op nurse will coordinate an appropriate time for 1 support person to accompany patient in pre-op.  This support person may not rotate.    Please refer to the [facility] website for the visitor guidelines for Inpatients (after your surgery is over and you are in a regular room).       Your procedure is scheduled on:  04/11/23    Report to [facility] Main Entrance    Report to admitting at   1100AM   Call this number if you have problems the morning of surgery [phone]   Do not eat food  or drink liquids :After Midnight.                       Oral Hygiene is also important to reduce your risk of infection.                                    Remember - BRUSH YOUR TEETH THE MORNING OF SURGERY WITH YOUR REGULAR TOOTHPASTE  DENTURES WILL BE REMOVED PRIOR TO SURGERY PLEASE DO NOT APPLY "Poly grip" OR ADHESIVES!!!   Do NOT smoke after Midnight   Take these medicines the morning of surgery with A SIP OF WATER:  none   DO NOT TAKE ANY ORAL DIABETIC MEDICATIONS DAY OF YOUR SURGERY  Bring CPAP mask and tubing day of surgery.                              You may not have any metal on your body including hair pins, jewelry, and body piercing             Do not wear make-up, lotions, powders, perfumes/cologne, or deodorant  Do not wear nail polish including gel and S&S, artificial/acrylic nails, or any other type of covering on natural nails  including finger and toenails. If you have artificial nails, gel coating, etc. that needs to be removed by a nail salon please have this removed prior to surgery or surgery may need to be canceled/ delayed if the surgeon/ anesthesia feels like they are unable to be safely monitored.   Do not shave  48 hours prior to surgery.               Men may shave face and neck.   Do not bring valuables to the hospital. [facility] IS NOT             RESPONSIBLE   FOR VALUABLES.   Contacts, glasses, dentures or bridgework may not be worn into surgery.   Bring small overnight bag day of surgery.   DO NOT BRING YOUR HOME MEDICATIONS TO THE HOSPITAL. PHARMACY WILL DISPENSE MEDICATIONS LISTED ON YOUR MEDICATION LIST TO YOU DURING YOUR ADMISSION IN THE HOSPITAL!    Patients discharged  on the day of surgery will not be allowed to drive home.  Someone NEEDS to stay with you for the first 24 hours after anesthesia.   Special Instructions: Bring a copy of your healthcare power of attorney and living will documents the day of surgery if you haven't scanned them before.              Please read over the following fact sheets you were given: IF YOU HAVE QUESTIONS ABOUT YOUR PRE-OP INSTRUCTIONS PLEASE CALL [phone]   If you received a COVID test during your pre-op visit  it is requested that you wear a mask when out in public, stay away from anyone that may not be feeling well and notify your surgeon if you develop symptoms. If you test positive for Covid or have been in contact with anyone that has tested positive in the last 10 days please notify you surgeon.    [facility] - Preparing for Surgery Before surgery, you can play an important role.  Because skin is not sterile, your skin needs to be as free of germs as possible.  You can reduce the number of germs on your skin by washing with CHG (chlorahexidine gluconate) soap before surgery.  CHG is an antiseptic cleaner which kills germs and bonds with the  skin to continue killing germs even after washing. Please DO NOT use if you have an allergy to CHG or antibacterial soaps.  If your skin becomes reddened/irritated stop using the CHG and inform your nurse when you arrive at Short Stay. Do not shave (including legs and underarms) for at least 48 hours prior to the first CHG shower.  You may shave your face/neck. Please follow these instructions carefully:  1.  Shower with CHG Soap the night before surgery and the  morning of Surgery.  2.  If you choose to wash your hair, wash your hair first as usual with your  normal  shampoo.  3.  After you shampoo, rinse your hair and body thoroughly to remove the  shampoo.                           4.  Use CHG as you would any other liquid soap.  You can apply chg directly  to the skin and wash                       Gently with a scrungie or clean washcloth.  5.  Apply the CHG Soap to your body ONLY FROM THE NECK DOWN.   Do not use on face/ open                           Wound or open sores. Avoid contact with eyes, ears mouth and genitals (private parts).                       Wash face,  Genitals (private parts) with your normal soap.             6.  Wash thoroughly, paying special attention to the area where your surgery  will be performed.  7.  Thoroughly rinse your body with warm water from the neck down.  8.  DO NOT shower/wash with your normal soap after using and rinsing off  the CHG Soap.  9.  Pat yourself dry with a clean towel.            10.  Wear clean pajamas.            11.  Place clean sheets on your bed the night of your first shower and do not  sleep with pets. Day of Surgery : Do not apply any lotions/deodorants the morning of surgery.  Please wear clean clothes to the hospital/surgery center.  FAILURE TO FOLLOW THESE INSTRUCTIONS MAY RESULT IN THE CANCELLATION OF YOUR SURGERY PATIENT SIGNATURE_________________________________  NURSE  SIGNATURE__________________________________  ________________________________________________________________________

## 2023-04-06 ENCOUNTER — Encounter (HOSPITAL_COMMUNITY)
Admission: RE | Admit: 2023-04-06 | Discharge: 2023-04-06 | Disposition: A | Discharge status: Home / Self Care | Payer: 59 | Source: Ambulatory Visit | Attending: Urology | Admitting: Urology

## 2023-04-06 ENCOUNTER — Encounter (HOSPITAL_COMMUNITY): Payer: Self-pay

## 2023-04-06 ENCOUNTER — Other Ambulatory Visit: Payer: Self-pay

## 2023-04-06 VITALS — BP 126/98 | HR 90 | Temp 98.2°F | Resp 16 | Ht 67.5 in | Wt 164.0 lb

## 2023-04-06 DIAGNOSIS — Z01818 Encounter for other preprocedural examination: Secondary | ICD-10-CM | POA: Diagnosis not present

## 2023-04-06 LAB — CBC
HCT: 45.9 % (ref 39.0–52.0)
Hemoglobin: 15.8 g/dL (ref 13.0–17.0)
MCH: 30.9 pg (ref 26.0–34.0)
MCHC: 34.4 g/dL (ref 30.0–36.0)
MCV: 89.6 fL (ref 80.0–100.0)
Platelets: 272 10*3/uL (ref 150–400)
RBC: 5.12 MIL/uL (ref 4.22–5.81)
RDW: 12.1 % (ref 11.5–15.5)
WBC: 5.8 10*3/uL (ref 4.0–10.5)
nRBC: 0 % (ref 0.0–0.2)

## 2023-04-06 LAB — BASIC METABOLIC PANEL
Anion gap: 9 (ref 5–15)
BUN: 15 mg/dL (ref 6–20)
CO2: 24 mmol/L (ref 22–32)
Calcium: 8.8 mg/dL — ABNORMAL LOW (ref 8.9–10.3)
Chloride: 103 mmol/L (ref 98–111)
Creatinine, Ser: 1.02 mg/dL (ref 0.61–1.24)
GFR, Estimated: >60 mL/min (ref >60)
Glucose, Bld: 109 mg/dL — ABNORMAL HIGH (ref 70–99)
Potassium: 3.6 mmol/L (ref 3.5–5.1)
Sodium: 136 mmol/L (ref 135–145)

## 2023-04-06 LAB — NO BLOOD PRODUCTS

## 2023-04-10 NOTE — H&P (Signed)
Patient is a 62 year old white male with prior history of nephrolithiasis. He presented to his PCP approximately 5 days ago with 1 to 2-week history of some lower abdominal discomfort. A CT urogram was performed on 03/22/2023 but not read until 03/24/2023. This showed minimal right-sided hydronephrosis and periureteral stranding secondary to a 6 mm stone in the proximal right ureter. There is also left renal stone and there is trace left ureteral fullness with a 4 mm stone in the left proximal ureter there is are decompressed distally. Port below. Patient has had minimal discomfort, no nausea vomiting fever or gross hematuria. Now here for follow-up.  Micro urinalysis shows 0-2 RBCs rare bacteria otherwise clear  KUB is reviewed today and shows:    CLINICAL DATA: Abdominal and flank pain, urethral pressure.   EXAM:  CT ABDOMEN AND PELVIS WITHOUT CONTRAST   TECHNIQUE:  Multidetector CT imaging of the abdomen and pelvis was performed  following the standard protocol without IV contrast.   RADIATION DOSE REDUCTION: This exam was performed according to the  departmental dose-optimization program which includes automated  exposure control, adjustment of the mA and/or kV according to  patient size and/or use of iterative reconstruction technique.   COMPARISON: 08/18/2015.   FINDINGS:  Lower chest: Scarring and bronchiectasis in the medial left lower  lobe. Heart size normal. No pericardial or pleural effusion. Distal  esophagus is grossly unremarkable.   Hepatobiliary: Probable cyst in the left hepatic lobe. Liver and  gallbladder are otherwise unremarkable. No biliary ductal  dilatation.   Pancreas: Negative.   Spleen: Negative.   Adrenals/Urinary Tract: Adrenal glands and right kidney are  unremarkable. Minimal right hydronephrosis and periureteric  stranding secondary to a 6 mm stone in the proximal right ureter  (2/43 and 5/69). Left renal stone. Trace left ureteral fullness with  a 4  mm stone in the proximal left ureter (2/42 and 5/73). Ureters  are otherwise decompressed. Bladder is grossly unremarkable.   Stomach/Bowel: Stomach, small bowel, appendix and colon are  unremarkable.   Vascular/Lymphatic: Atherosclerotic calcification of the aorta. No  pathologically enlarged lymph nodes.   Reproductive: Prostate is visualized.   Other: No free fluid. Bilateral inguinal hernias contain fat.  Mesenteries and peritoneum are unremarkable.   Musculoskeletal: IM rod and screw in the proximal left femur.  Degenerative changes in the spine. No worrisome lytic or sclerotic  lesions. Prominent Schmorl's node in the L2 superior endplate.   IMPRESSION:  1. Stones in the proximal ureters bilaterally with minimal right  hydronephrosis and trace left ureteral fullness.  2. Bilateral renal stones.  3. Aortic atherosclerosis (ICD10-I70.0).    Electronically Signed  By: Leanna Battles M.D.  On: 03/24/2023 12:54    03/31/2023: Seen earlier this week by Dr. Benancio Deeds. Being followed closely for recent diagnosis of bilateral ureteral calculi. Patient walked in today that was added to my schedule for evaluation of a new issue. For the past 36 hours the patient has been having what he describes as tingling in the face and upper extremities. Not associated with any dizziness, he denies any chest pain, palpitations or shortness of breath. He does not feel lightheaded. Thinking it may have been the tamsulosin he has not had a dose of that since Wednesday. He has not had any significant pain or discomfort requiring the use of previously prescribed pain medication as well. He denies any dysuria or gross hematuria. He feels like he is voiding at his baseline. UA here within normal limits. Patient in no  acute distress.   04/03/2023:  Patient returns for close follow-up on his bilateral ureteral stone. Unfortunately, patient's urologist was unable to see patient today. Since last seen, patient has  experienced worsening suprapubic discomfort, 3-4/10, and lower back pain, 1/10. His suprapubic discomfort was sufficient for patient to take a hydrocodone pill overnight. He has not seen any stone material pass, and has not experienced worsening voiding function. He remains at baseline. He further denies dysuria, gross hematuria, fever/chills, nausea/vomiting. Of note, patient's prior symptoms of tingling in arms has improved, he no longer has tingling in his face. However, he has begun to sense it in his right leg distally.     ALLERGIES: Morphine Derivatives    MEDICATIONS: Tamsulosin Hcl 0.4 mg capsule 1 capsule PO Daily  Amlodipine-Valsartan 5 mg-160 mg tablet 1 tablet PO Daily  CBD Oil  Dextroamphetamine-Amphet Er 20 mg capsule, ext release 24 hr 1 capsule PO Daily  Ibuprofen 200 mg tablet  Vitamin C     GU PSH: Cysto Uretero Lithotripsy - 2011 Cystoscopy Insert Stent - 2011       PSH Notes: Bronchoscopy, Cystoscopy With Ureteroscopy With Lithotripsy, Cystoscopy With Insertion Of Ureteral Stent Right, Femur Repair, Knee Replacement, Inguinal Hernia Repair   NON-GU PSH: Dx Bronchoscope/wash - 2016 Hernia Repair Leg surgery (unspecified) Revise Knee Joint - 2010     GU PMH: Ureteral calculus - 03/31/2023, (Stable), - 03/27/2023, Ureteral Stone, - 2014 Abdominal Pain Unspec, Abdominal pain - 2016      PMH Notes:  2010-11-18 08:24:38 - Note: Nephrolithiasis Of Both Kidneys   NON-GU PMH: Encounter for general adult medical examination without abnormal findings, Encounter for preventive health examination - 2016 Obstructive sleep apnea (adult) (pediatric), Obstructive sleep apnea, adult - 2016 Personal history of (healed) traumatic fracture, History of fracture of femur - 2016 Personal history of other diseases of the circulatory system, History of hypertension - 2016 Personal history of other diseases of the nervous system and sense organs, History of migraine headaches -  2016 Personal history of other mental and behavioral disorders, History of attention deficit disorder - 2016 Hypercalciuria, Hypercalciuria - 2014 Sarcoidosis, unspecified, Sarcoidosis - 2014 Hypertension    Immunizations: None   FAMILY HISTORY: Hypertension - Brother nephrolithiasis - Mother   SOCIAL HISTORY: Marital Status: Married Preferred Language: English; Ethnicity: Not Hispanic Or Latino; Race: White Current Smoking Status: Patient has never smoked.   Tobacco Use Assessment Completed: Used Tobacco in last 30 days? Does drink.  Does not drink caffeine.     Notes: Caffeine Use, Alcohol Use, Occupation, Never a smoker, Marital History - Currently Married   REVIEW OF SYSTEMS:    GU Review Male:   Patient denies frequent urination, hard to postpone urination, burning/ pain with urination, get up at night to urinate, leakage of urine, stream starts and stops, trouble starting your stream, have to strain to urinate , erection problems, and penile pain.  Gastrointestinal (Upper):   Patient denies nausea, vomiting, and indigestion/ heartburn.  Gastrointestinal (Lower):   Patient denies diarrhea and constipation.  Constitutional:   Patient denies fever, night sweats, weight loss, and fatigue.  Skin:   Patient denies itching and skin rash/ lesion.  Eyes:   Patient denies blurred vision and double vision.  Ears/ Nose/ Throat:   Patient denies sore throat and sinus problems.  Hematologic/Lymphatic:   Patient denies swollen glands and easy bruising.  Cardiovascular:   Patient denies leg swelling and chest pains.  Respiratory:   Patient denies cough and  shortness of breath.  Endocrine:   Patient denies excessive thirst.  Musculoskeletal:   Patient denies back pain and joint pain.  Neurological:   Patient denies headaches and dizziness.  Psychologic:   Patient denies depression and anxiety.   Notes: Suprapubic discomfort, lower back pain, tingling in extremities    VITAL SIGNS: None    MULTI-SYSTEM PHYSICAL EXAMINATION:    Constitutional: Well-nourished. No physical deformities. Normally developed. Good grooming. Patient is pleasant, in no acute distress.  Respiratory: No labored breathing, no use of accessory muscles.   Cardiovascular: Normal temperature, normal extremity pulses, no swelling.   Skin: No paleness, no jaundice, no cyanosis.  Neurologic / Psychiatric: Oriented to time, oriented to place, oriented to person. Stroke screen negative. No acute symptoms or sequela of ACI.  Gastrointestinal: No mass, no suprapubic or bilateral CVA tenderness, no rigidity, non obese abdomen.   Musculoskeletal: Mild lower back pain at the level of L4/L5, without radiation. Good motor function, strength to all extremities.     Complexity of Data:  Source Of History:  Patient, Medical Record Summary  Records Review:   Previous Doctor Records, Previous Patient Records  Urine Test Review:   Urinalysis  X-Ray Review: KUB: Reviewed Films. Discussed With Patient.  Renal Ultrasound: Reviewed Films. Reviewed Report. Discussed With Patient.     04/03/23  Urinalysis  Urine Appearance Clear   Urine Color Yellow   Urine Glucose Neg mg/dL  Urine Bilirubin Neg mg/dL  Urine Ketones Neg mg/dL  Urine Specific Gravity 1.015   Urine Blood 3+ ery/uL  Urine pH 6.0   Urine Protein Trace mg/dL  Urine Urobilinogen 0.2 mg/dL  Urine Nitrites Neg   Urine Leukocyte Esterase Neg leu/uL  Urine WBC/hpf NS (Not Seen)   Urine RBC/hpf 40 - 60/hpf   Urine Epithelial Cells NS (Not Seen)   Urine Bacteria NS (Not Seen)   Urine Mucous Not Present   Urine Yeast NS (Not Seen)   Urine Trichomonas Not Present   Urine Cystals NS (Not Seen)   Urine Casts NS (Not Seen)   Urine Sperm Not Present    PROCEDURES:         KUB - 16109  A single view of the abdomen is obtained. Right renal shadow poorly visualized due to overlying fecal pattern. No obvious opacities noted. Left renal shadow partially visualized,  without obvious opacities noted. The expected anatomical course of the right ureter grossly clear. The expected anatomical course of the left ureter with a 4 mm distal calcification compatible with known stone, near the UVJ. Stable phlebolith pattern at the lower left pelvic border.      Patient confirmed No Neulasta OnPro Device.            Renal Ultrasound - 60454  Right Kidney: Length: 11.0 cm Depth: 6.2 cm Cortical Width: 1.4 cm Width: 4.8 cm  Left Kidney: Length: 10.4 cm Depth: 7.2 cm Cortical Width: 1.5 cm Width: 5.1 cm  Left Kidney/Ureter:  Single calcification LP  Right Kidney/Ureter:  Small cyst UP o/w within normal limits.  Bladder:  PVR 45.25 ml      Patient confirmed No Neulasta OnPro Device.           Urinalysis w/Scope Dipstick Dipstick Cont'd Micro  Color: Yellow Bilirubin: Neg mg/dL WBC/hpf: NS (Not Seen)  Appearance: Clear Ketones: Neg mg/dL RBC/hpf: 40 - 09/WJX  Specific Gravity: 1.015 Blood: 3+ ery/uL Bacteria: NS (Not Seen)  pH: 6.0 Protein: Trace mg/dL Cystals: NS (Not Seen)  Glucose: Neg  mg/dL Urobilinogen: 0.2 mg/dL Casts: NS (Not Seen)    Nitrites: Neg Trichomonas: Not Present    Leukocyte Esterase: Neg leu/uL Mucous: Not Present      Epithelial Cells: NS (Not Seen)      Yeast: NS (Not Seen)      Sperm: Not Present    ASSESSMENT:      ICD-10 Details  1 GU:   Ureteral calculus - N20.1 Bilateral, Acute, Complicated Injury   PLAN:           Orders X-Rays: Renal Ultrasound    KUB          Schedule Return Visit/Planned Activity: Next Available Appointment - Schedule Surgery          Document Letter(s):  Created for Patient: Clinical Summary         Notes:   Today, UA with microhematuria. Patient has not seen any stone material pass. He is experiencing suprapubic discomfort and lower back pain. KUB with noted 4 mm left distal/UVJ stone, compatible with prior noted stone. His right ureteral 6mm stone is not seen on KUB. Bilateral renal  ultrasound without obstruction.   We reviewed with patient options going forward, with continued MET, versus definitive intervention with ESWL for his left 4 mm UVJ stone, versus ureteroscopy. Reviewed risk and benefits of each in detail. Patient has undergone ureteroscopy with stenting in the past. Ultimately, patient would like to undergo bilateral ureteroscopy. He would like to achieve stone free status. On recent CT, he has a 3 mm left mid/lower pole calculus. No right-sided nephrolithiasis.   For patient's extremity tingling concern, we advised patient to follow-up with his primary care.   We will complete a surgical posting sheet for bilateral ureteroscopy and submitted to his urologist's scheduler. The scheduler will be in contact with patient next to arrange for procedure. We will also submit urine sample for presurgical culture and follow-up with patient as appropriate. Should patient pass stones, he knows to attempt to collect them and to inform the office. Should patient's presentation worsen, with moderate or severe unilateral pain, he knows to return to the office for reevaluation. Should patient experience severe bilateral pain, reduced urine output, or worsening voiding function, he knows to present urgently to ED. Continue on tamsulosin. Patient voiced understanding and is amenable to this plan.   Surgical posting sheet submitted to scheduler.

## 2023-04-11 ENCOUNTER — Ambulatory Visit (HOSPITAL_BASED_OUTPATIENT_CLINIC_OR_DEPARTMENT_OTHER): Payer: 59 | Admitting: Anesthesiology

## 2023-04-11 ENCOUNTER — Encounter (HOSPITAL_COMMUNITY)
Admission: RE | Disposition: A | Discharge status: Home / Self Care | Payer: Self-pay | Source: Ambulatory Visit | Attending: Urology

## 2023-04-11 ENCOUNTER — Ambulatory Visit (HOSPITAL_COMMUNITY): Payer: 59 | Admitting: Physician Assistant

## 2023-04-11 ENCOUNTER — Encounter (HOSPITAL_COMMUNITY): Payer: Self-pay | Admitting: Urology

## 2023-04-11 ENCOUNTER — Ambulatory Visit (HOSPITAL_COMMUNITY)
Admission: RE | Admit: 2023-04-11 | Discharge: 2023-04-11 | Disposition: A | Discharge status: Home / Self Care | Payer: 59 | Source: Ambulatory Visit | Attending: Urology | Admitting: Urology

## 2023-04-11 ENCOUNTER — Ambulatory Visit (HOSPITAL_COMMUNITY): Payer: 59

## 2023-04-11 DIAGNOSIS — I10 Essential (primary) hypertension: Secondary | ICD-10-CM | POA: Diagnosis not present

## 2023-04-11 DIAGNOSIS — N201 Calculus of ureter: Secondary | ICD-10-CM | POA: Insufficient documentation

## 2023-04-11 DIAGNOSIS — Z01818 Encounter for other preprocedural examination: Secondary | ICD-10-CM

## 2023-04-11 DIAGNOSIS — G4730 Sleep apnea, unspecified: Secondary | ICD-10-CM | POA: Diagnosis not present

## 2023-04-11 DIAGNOSIS — R9341 Abnormal radiologic findings on diagnostic imaging of renal pelvis, ureter, or bladder: Secondary | ICD-10-CM | POA: Diagnosis not present

## 2023-04-11 HISTORY — PX: HOLMIUM LASER APPLICATION: SHX5852

## 2023-04-11 HISTORY — PX: CYSTOSCOPY WITH RETROGRADE PYELOGRAM, URETEROSCOPY AND STENT PLACEMENT: SHX5789

## 2023-04-11 SURGERY — CYSTOURETEROSCOPY, WITH RETROGRADE PYELOGRAM AND STENT INSERTION
Anesthesia: General | Laterality: Bilateral

## 2023-04-11 MED ORDER — MIDAZOLAM HCL 2 MG/2ML IJ SOLN
INTRAMUSCULAR | Status: AC
  Filled 2023-04-11: qty 2

## 2023-04-11 MED ORDER — ONDANSETRON HCL 4 MG/2ML IJ SOLN
INTRAMUSCULAR | Status: AC
  Administered 2023-04-11: 4 mg via INTRAVENOUS
  Filled 2023-04-11: qty 2

## 2023-04-11 MED ORDER — 0.9 % SODIUM CHLORIDE (POUR BTL) OPTIME
TOPICAL | Status: DC | PRN
  Administered 2023-04-11: 1000 mL

## 2023-04-11 MED ORDER — LACTATED RINGERS IV SOLN: INTRAVENOUS | Status: DC

## 2023-04-11 MED ORDER — KETOROLAC TROMETHAMINE 15 MG/ML IJ SOLN
INTRAMUSCULAR | Status: DC | PRN
  Administered 2023-04-11: 30 mg via INTRAVENOUS

## 2023-04-11 MED ORDER — KETOROLAC TROMETHAMINE 30 MG/ML IJ SOLN
INTRAMUSCULAR | Status: AC
  Filled 2023-04-11: qty 1

## 2023-04-11 MED ORDER — DEXAMETHASONE SODIUM PHOSPHATE 4 MG/ML IJ SOLN
INTRAMUSCULAR | Status: DC | PRN
  Administered 2023-04-11: 5 mg via INTRAVENOUS

## 2023-04-11 MED ORDER — FENTANYL CITRATE PF 50 MCG/ML IJ SOSY
PREFILLED_SYRINGE | INTRAMUSCULAR | Status: AC
  Administered 2023-04-11: 50 ug via INTRAVENOUS
  Filled 2023-04-11: qty 1

## 2023-04-11 MED ORDER — CEFAZOLIN SODIUM-DEXTROSE 2-4 GM/100ML-% IV SOLN
2.0000 g | INTRAVENOUS | Status: AC
  Administered 2023-04-11: 2 g via INTRAVENOUS
  Filled 2023-04-11: qty 100

## 2023-04-11 MED ORDER — DEXAMETHASONE SODIUM PHOSPHATE 10 MG/ML IJ SOLN
INTRAMUSCULAR | Status: AC
  Filled 2023-04-11: qty 1

## 2023-04-11 MED ORDER — FENTANYL CITRATE PF 50 MCG/ML IJ SOSY
PREFILLED_SYRINGE | INTRAMUSCULAR | Status: AC
  Administered 2023-04-11: 50 ug via INTRAVENOUS
  Filled 2023-04-11: qty 2

## 2023-04-11 MED ORDER — FENTANYL CITRATE (PF) 100 MCG/2ML IJ SOLN
INTRAMUSCULAR | Status: DC | PRN
  Administered 2023-04-11 (×2): 50 ug via INTRAVENOUS

## 2023-04-11 MED ORDER — ORAL CARE MOUTH RINSE: 15.0000 mL | Freq: Once | OROMUCOSAL | Status: AC

## 2023-04-11 MED ORDER — PROPOFOL 10 MG/ML IV BOLUS
INTRAVENOUS | Status: AC
  Filled 2023-04-11: qty 20

## 2023-04-11 MED ORDER — ONDANSETRON HCL 4 MG/2ML IJ SOLN: 4.0000 mg | Freq: Once | INTRAMUSCULAR | Status: AC | PRN

## 2023-04-11 MED ORDER — AMISULPRIDE (ANTIEMETIC) 5 MG/2ML IV SOLN: 10.0000 mg | Freq: Once | INTRAVENOUS | Status: AC

## 2023-04-11 MED ORDER — FENTANYL CITRATE PF 50 MCG/ML IJ SOSY: 25.0000 ug | PREFILLED_SYRINGE | INTRAMUSCULAR | Status: DC | PRN

## 2023-04-11 MED ORDER — MIDAZOLAM HCL 5 MG/5ML IJ SOLN
INTRAMUSCULAR | Status: DC | PRN
  Administered 2023-04-11: 2 mg via INTRAVENOUS

## 2023-04-11 MED ORDER — LIDOCAINE HCL (CARDIAC) PF 100 MG/5ML IV SOSY
PREFILLED_SYRINGE | INTRAVENOUS | Status: DC | PRN
  Administered 2023-04-11: 80 mg via INTRATRACHEAL

## 2023-04-11 MED ORDER — FENTANYL CITRATE (PF) 100 MCG/2ML IJ SOLN
INTRAMUSCULAR | Status: AC
  Filled 2023-04-11: qty 2

## 2023-04-11 MED ORDER — AMISULPRIDE (ANTIEMETIC) 5 MG/2ML IV SOLN
INTRAVENOUS | Status: AC
  Administered 2023-04-11: 10 mg
  Filled 2023-04-11: qty 4

## 2023-04-11 MED ORDER — ONDANSETRON HCL 4 MG/2ML IJ SOLN
INTRAMUSCULAR | Status: DC | PRN
  Administered 2023-04-11: 4 mg via INTRAVENOUS

## 2023-04-11 MED ORDER — SODIUM CHLORIDE 0.9 % IR SOLN
Status: DC | PRN
  Administered 2023-04-11: 3000 mL via INTRAVESICAL

## 2023-04-11 MED ORDER — ONDANSETRON HCL 4 MG/2ML IJ SOLN
INTRAMUSCULAR | Status: AC
  Filled 2023-04-11: qty 2

## 2023-04-11 MED ORDER — HYDROCODONE-ACETAMINOPHEN 5-325 MG PO TABS: 1.0000 | ORAL_TABLET | ORAL | 0 refills | Status: AC | PRN

## 2023-04-11 MED ORDER — ACETAMINOPHEN 10 MG/ML IV SOLN
INTRAVENOUS | Status: AC
  Filled 2023-04-11: qty 100

## 2023-04-11 MED ORDER — CHLORHEXIDINE GLUCONATE 0.12 % MT SOLN
15.0000 mL | Freq: Once | OROMUCOSAL | Status: AC
  Administered 2023-04-11: 15 mL via OROMUCOSAL

## 2023-04-11 MED ORDER — OXYCODONE HCL 5 MG PO TABS
ORAL_TABLET | ORAL | Status: AC
  Administered 2023-04-11: 5 mg via ORAL
  Filled 2023-04-11: qty 1

## 2023-04-11 MED ORDER — OXYCODONE HCL 5 MG PO TABS: 5.0000 mg | ORAL_TABLET | Freq: Once | ORAL | Status: AC | PRN

## 2023-04-11 MED ORDER — PROPOFOL 500 MG/50ML IV EMUL
INTRAVENOUS | Status: DC | PRN
  Administered 2023-04-11: 200 mg via INTRAVENOUS

## 2023-04-11 MED ORDER — ACETAMINOPHEN 10 MG/ML IV SOLN
1000.0000 mg | Freq: Once | INTRAVENOUS | Status: DC | PRN
  Administered 2023-04-11: 1000 mg via INTRAVENOUS

## 2023-04-11 MED ORDER — OXYCODONE HCL 5 MG/5ML PO SOLN: 5.0000 mg | Freq: Once | ORAL | Status: AC | PRN

## 2023-04-11 MED ORDER — IOHEXOL 300 MG/ML  SOLN
INTRAMUSCULAR | Status: DC | PRN
  Administered 2023-04-11: 20 mL

## 2023-04-11 SURGICAL SUPPLY — 27 items
BAG COUNTER SPONGE SURGICOUNT (BAG) IMPLANT
BAG SPNG CNTER NS LX DISP (BAG)
BAG URO CATCHER STRL LF (MISCELLANEOUS) ×1 IMPLANT
BASKET ZERO TIP NITINOL 2.4FR (BASKET) IMPLANT
BSKT STON RTRVL ZERO TP 2.4FR (BASKET)
BULB IRRIG PATHFIND (MISCELLANEOUS) ×1 IMPLANT
CATH URETL OPEN 5X70 (CATHETERS) IMPLANT
CLOTH BEACON ORANGE TIMEOUT ST (SAFETY) ×1 IMPLANT
COVER SURGICAL LIGHT HANDLE (MISCELLANEOUS) ×1 IMPLANT
EXTRACTOR STONE 1.7FRX115CM (UROLOGICAL SUPPLIES) IMPLANT
GLOVE SURG LX STRL 7.5 STRW (GLOVE) ×1 IMPLANT
GOWN STRL REUS W/ TWL XL LVL3 (GOWN DISPOSABLE) ×1 IMPLANT
GOWN STRL REUS W/TWL XL LVL3 (GOWN DISPOSABLE) ×1
GUIDEWIRE ANG ZIPWIRE 038X150 (WIRE) IMPLANT
GUIDEWIRE STR DUAL SENSOR (WIRE) ×1 IMPLANT
KIT TURNOVER KIT A (KITS) IMPLANT
LASER FIB FLEXIVA PULSE ID 365 (Laser) IMPLANT
MANIFOLD NEPTUNE II (INSTRUMENTS) ×1 IMPLANT
PACK CYSTO (CUSTOM PROCEDURE TRAY) ×1 IMPLANT
PENCIL SMOKE EVACUATOR (MISCELLANEOUS) IMPLANT
SHEATH NAVIGATOR HD 11/13X36 (SHEATH) IMPLANT
STENT URET 6FRX26 CONTOUR (STENTS) IMPLANT
SYR 20ML LL LF (SYRINGE) ×1 IMPLANT
TRACTIP FLEXIVA PULS ID 200XHI (Laser) IMPLANT
TRACTIP FLEXIVA PULSE ID 200 (Laser) ×1
TUBING CONNECTING 10 (TUBING) ×1 IMPLANT
TUBING UROLOGY SET (TUBING) ×1 IMPLANT

## 2023-04-11 NOTE — Anesthesia Procedure Notes (Signed)
Procedure Name: LMA Insertion Date/Time: 04/11/2023 2:02 PM  Performed by: Deri Fuelling, CRNAPre-anesthesia Checklist: Patient identified, Emergency Drugs available, Suction available and Patient being monitored Patient Re-evaluated:Patient Re-evaluated prior to induction Oxygen Delivery Method: Circle system utilized Preoxygenation: Pre-oxygenation with 100% oxygen Induction Type: IV induction Ventilation: Mask ventilation without difficulty LMA Size: 4.0 Tube type: Oral Number of attempts: 1 Airway Equipment and Method: Stylet and Oral airway Placement Confirmation: ETT inserted through vocal cords under direct vision, positive ETCO2 and breath sounds checked- equal and bilateral Tube secured with: Tape Dental Injury: Teeth and Oropharynx as per pre-operative assessment

## 2023-04-11 NOTE — Anesthesia Postprocedure Evaluation (Signed)
Anesthesia Post Note  Patient: Barry Silva  Procedure(s) Performed: CYSTOSCOPY WITH RETROGRADE PYELOGRAM, URETEROSCOPY AND STENT PLACEMENT (Bilateral) HOLMIUM LASER APPLICATION (Bilateral)     Patient location during evaluation: PACU Anesthesia Type: General Level of consciousness: awake and alert Pain management: pain level controlled Vital Signs Assessment: post-procedure vital signs reviewed and stable Respiratory status: spontaneous breathing, nonlabored ventilation, respiratory function stable and patient connected to nasal cannula oxygen Cardiovascular status: blood pressure returned to baseline and stable Postop Assessment: no apparent nausea or vomiting Anesthetic complications: no  No notable events documented.  Last Vitals:  Vitals:   04/11/23 1448 04/11/23 1500  BP:  111/84  Pulse:  (!) 54  Resp:  13  Temp:    SpO2: 100% 96%    Last Pain:  Vitals:   04/11/23 1505  TempSrc:   PainSc: 5                  Safa Derner S

## 2023-04-11 NOTE — Transfer of Care (Signed)
Immediate Anesthesia Transfer of Care Note  Patient: Barry Silva  Procedure(s) Performed: CYSTOSCOPY WITH RETROGRADE PYELOGRAM, URETEROSCOPY AND STENT PLACEMENT (Bilateral) HOLMIUM LASER APPLICATION (Bilateral)  Patient Location: PACU  Anesthesia Type:General  Level of Consciousness: sedated  Airway & Oxygen Therapy: Patient Spontanous Breathing and Patient connected to nasal cannula oxygen  Post-op Assessment: Report given to RN and Post -op Vital signs reviewed and stable  Post vital signs: Reviewed and stable  Last Vitals:  Vitals Value Taken Time  BP 123/87 04/11/23 1430  Temp    Pulse 64 04/11/23 1431  Resp 11 04/11/23 1431  SpO2 100 % 04/11/23 1431  Vitals shown include unvalidated device data.  Last Pain:  Vitals:   04/11/23 1053  TempSrc: Oral  PainSc: 3       Patients Stated Pain Goal: 3 (04/11/23 1053)  Complications: No notable events documented.

## 2023-04-11 NOTE — Anesthesia Preprocedure Evaluation (Signed)
Anesthesia Evaluation  Patient identified by MRN, date of birth, ID band Patient awake    Reviewed: Allergy & Precautions, H&P , NPO status , Patient's Chart, lab work & pertinent test results  Airway Mallampati: II  TM Distance: >3 FB Neck ROM: Full    Dental no notable dental hx.    Pulmonary sleep apnea    Pulmonary exam normal breath sounds clear to auscultation       Cardiovascular hypertension, Normal cardiovascular exam Rhythm:Regular Rate:Normal     Neuro/Psych negative neurological ROS  negative psych ROS   GI/Hepatic negative GI ROS, Neg liver ROS,,,  Endo/Other  negative endocrine ROS    Renal/GU negative Renal ROS  negative genitourinary   Musculoskeletal negative musculoskeletal ROS (+)    Abdominal   Peds negative pediatric ROS (+)  Hematology negative hematology ROS (+)   Anesthesia Other Findings   Reproductive/Obstetrics negative OB ROS                             Anesthesia Physical Anesthesia Plan  ASA: 2  Anesthesia Plan: General   Post-op Pain Management: Toradol IV (intra-op)*   Induction: Intravenous  PONV Risk Score and Plan: 2 and Ondansetron, Dexamethasone and Treatment may vary due to age or medical condition  Airway Management Planned: LMA  Additional Equipment:   Intra-op Plan:   Post-operative Plan: Extubation in OR  Informed Consent: I have reviewed the patients History and Physical, chart, labs and discussed the procedure including the risks, benefits and alternatives for the proposed anesthesia with the patient or authorized representative who has indicated his/her understanding and acceptance.     Dental advisory given  Plan Discussed with: CRNA and Surgeon  Anesthesia Plan Comments:        Anesthesia Quick Evaluation

## 2023-04-11 NOTE — Op Note (Signed)
Preoperative diagnosis:  1.  Bilateral ureteral calculi  Postoperative diagnosis: 1.  Same  Procedure(s): 1.  Cystoscopy, bilateral retrograde pyelogram with intraoperative interpretation, bilateral ureteroscopy with laser lithotripsy, stone extraction's, insertion of bilateral JJ stents  Surgeon: Dr. Karoline Caldwell  Anesthesia: General  Complications: None  EBL: Minimum  Specimens: None  Disposition of specimens: Not applicable  Intraoperative findings: 6 mm right distal ureteral calculus with stone dusted and irrigated from ureter complete extraction.  6 mm left UVJ stone with portion of the stone at the ureteral orifice.  Able to manipulate stone back into the proximally dilated left distal ureter, stone dusted and fragments extracted.  6 [place] by 26 cm Percuflex plus soft contour stent placed bilaterally no tether   Indication: 62 year old white male presented with flank pain.  Found to have bilateral obstructing ureteral calculi with normal renal function.  Has failed medical expulsive therapy and presents at this time undergo cystoscopy and ureteroscopy and laser lithotripsy stone extraction.  Description of procedure:  After obtaining informed consent for the patient he was taken major cystoscopy suite placed under general anesthesia.  Placed in dorsolithotomy position genitalia prepped and draped in usual sterile fashion.  Proper pause and timeout was performed.  21 [place] scope was advanced in the bladder without difficulty.  The right ureteral orifice was in normal position.  The left ureteral orifice noted to have a stone at the opening completely obstructing orifice.  5 [place] open-ended catheter was utilized to perform retrograde pyelogram on the right side which showed a filling defect in the right distal ureter consistent with stone but had migrated distal to the crossing of the iliac vessels.  A sensor wire was passed up the right renal pelvis without difficulty.   Cystoscope was removed.  6.4 French semirigid ureteroscope was then advanced adjacent to the guidewire Emina able to be manipulated alongside the guidewire into the right ureter.  None was encountered.  A 240 m holmium laser fiber was utilized to dust the stone into numerous small fragments.  Stone was very soft and the fragments all essentially irrigated out except for a couple of small fragments which were basket extracted utilizing an engage basket.  Second look in the ureter revealed no remaining fragments but some mild edema around the stone lodgment site.  Retrograde pyelogram showed good integrity of the ureter and collecting system with no extravasation.  Sensor wire was in place and back fed through the cystoscope.  A 6 French by 26 cm Percuflex plus soft contour stent was placed leaving a proximal coil in the renal pelvis and distal coil in the bladder.  There was good flow of urine through and around the stent noted.  Attention was then directed towards the left distal stone.  This was visualized outside the left ureteral orifice however mucosa overlapped this area and could not treat this with the laser needed for fear of injuring the ureteral mucosa.  I utilized the tip of the semirigid ureteroscope to push the stone back into the more proximally dilated left distal ureter.  At this point I was able to utilize laser fiber to dust this out into small pieces which were irrigated from the ureter.  Second look in the ureter revealed no remaining pieces and retrograde pyelogram confirmed open ureter without filling defects proximally.  Ureter had significant edema distally at the stone lodgment site and it was felt that stent was indicated.  Wire was then back fed through the cystoscope and a 6 [place]  by 26 cm Percuflex plus soft Contour stent was likewise placed on the left side with a proximal coil in the renal pelvis and a distal coil in the bladder with good flow of urine through and around the stent  noted.  No tether.  Attempted to collect some of the fragments however there were so small with dust particles that really could not collect for adequate analysis.  There were no significant fragments left in the bladder.  The bladder was emptied procedure terminated he was awakened from anesthesia and taken back to recovery in stable condition.  No immediate complication from the procedure

## 2023-04-12 ENCOUNTER — Encounter (HOSPITAL_COMMUNITY): Payer: Self-pay | Admitting: Urology

## 2023-04-18 DIAGNOSIS — N201 Calculus of ureter: Secondary | ICD-10-CM | POA: Diagnosis not present

## 2023-04-24 ENCOUNTER — Telehealth: Payer: Self-pay | Admitting: Internal Medicine

## 2023-04-24 MED ORDER — AMPHETAMINE-DEXTROAMPHET ER 20 MG PO CP24: 20.0000 mg | ORAL_CAPSULE | ORAL | 0 refills | Status: DC

## 2023-04-24 NOTE — Addendum Note (Signed)
Addended byMadelin Headings on: 04/24/2023 09:29 PM   Modules accepted: Orders

## 2023-04-24 NOTE — Telephone Encounter (Signed)
Prescription Request  04/24/2023  LOV: 03/15/2023  What is the name of the medication or equipment? amphetamine-dextroamphetamine (ADDERALL XR) 20 MG 24 hr capsule   Have you contacted your pharmacy to request a refill? No   Which pharmacy would you like this sent to?  [id number], [place] - [address] [place] [address] Phone: [phone] Fax: [phone]    Patient notified that their request is being sent to the clinical staff for review and that they should receive a response within 2 business days.   Please advise at Mobile [phone] (mobile)

## 2023-04-25 NOTE — Telephone Encounter (Signed)
Spoke to pt and made aware the prescription is sent. Verbalized understanding.

## 2023-05-02 DIAGNOSIS — M9902 Segmental and somatic dysfunction of thoracic region: Secondary | ICD-10-CM | POA: Diagnosis not present

## 2023-05-02 DIAGNOSIS — M9904 Segmental and somatic dysfunction of sacral region: Secondary | ICD-10-CM | POA: Diagnosis not present

## 2023-05-02 DIAGNOSIS — M9901 Segmental and somatic dysfunction of cervical region: Secondary | ICD-10-CM | POA: Diagnosis not present

## 2023-05-02 DIAGNOSIS — M4607 Spinal enthesopathy, lumbosacral region: Secondary | ICD-10-CM | POA: Diagnosis not present

## 2023-05-02 DIAGNOSIS — M62830 Muscle spasm of back: Secondary | ICD-10-CM | POA: Diagnosis not present

## 2023-05-07 ENCOUNTER — Other Ambulatory Visit: Payer: Self-pay | Admitting: Internal Medicine

## 2023-05-11 ENCOUNTER — Other Ambulatory Visit: Payer: Self-pay | Admitting: Internal Medicine

## 2023-05-23 ENCOUNTER — Telehealth: Payer: Self-pay | Admitting: Internal Medicine

## 2023-05-23 NOTE — Telephone Encounter (Signed)
Prescription Request  05/23/2023  LOV: 03/15/2023  What is the name of the medication or equipment?   amphetamine-dextroamphetamine (ADDERALL XR) 20 MG 24 hr capsule   Have you contacted your pharmacy to request a refill? No   Which pharmacy would you like this sent to?  [id number], [place] - [address] [place] [address] Phone: [phone] Fax: [phone]    Patient notified that their request is being sent to the clinical staff for review and that they should receive a response within 2 business days.   Please advise at Mobile [phone] (mobile)

## 2023-05-24 MED ORDER — AMPHETAMINE-DEXTROAMPHET ER 20 MG PO CP24: 20.0000 mg | ORAL_CAPSULE | ORAL | 0 refills | Status: DC

## 2023-05-30 DIAGNOSIS — M9904 Segmental and somatic dysfunction of sacral region: Secondary | ICD-10-CM | POA: Diagnosis not present

## 2023-05-30 DIAGNOSIS — M4607 Spinal enthesopathy, lumbosacral region: Secondary | ICD-10-CM | POA: Diagnosis not present

## 2023-05-30 DIAGNOSIS — M9902 Segmental and somatic dysfunction of thoracic region: Secondary | ICD-10-CM | POA: Diagnosis not present

## 2023-05-30 DIAGNOSIS — M62830 Muscle spasm of back: Secondary | ICD-10-CM | POA: Diagnosis not present

## 2023-05-30 DIAGNOSIS — M9901 Segmental and somatic dysfunction of cervical region: Secondary | ICD-10-CM | POA: Diagnosis not present

## 2023-06-27 ENCOUNTER — Other Ambulatory Visit: Payer: Self-pay | Admitting: Internal Medicine

## 2023-06-27 NOTE — Telephone Encounter (Signed)
Prescription Request   06/27/2023   LOV: 03/15/2023   What is the name of the medication or equipment?    amphetamine-dextroamphetamine (ADDERALL XR) 20 MG 24 hr capsule    Have you contacted your pharmacy to request a refill? No    Which pharmacy would you like this sent to?  [id number], [place] - [address] [place] [address] Phone: [phone] Fax: [phone]     Patient notified that their request is being sent to the clinical staff for review and that they should receive a response within 2 business days.    Please advise at Mobile [phone] (mobile)

## 2023-06-27 NOTE — Telephone Encounter (Signed)
-   last filled 05/24/23 - last OV 03/15/23

## 2023-06-28 MED ORDER — AMPHETAMINE-DEXTROAMPHET ER 20 MG PO CP24: 20.0000 mg | ORAL_CAPSULE | ORAL | 0 refills | Status: DC

## 2023-07-03 ENCOUNTER — Other Ambulatory Visit (INDEPENDENT_AMBULATORY_CARE_PROVIDER_SITE_OTHER): Payer: 59

## 2023-07-03 DIAGNOSIS — Z79899 Other long term (current) drug therapy: Secondary | ICD-10-CM

## 2023-07-03 LAB — HEPATIC FUNCTION PANEL
ALT: 25 U/L (ref 0–53)
AST: 23 U/L (ref 0–37)
Albumin: 4.3 g/dL (ref 3.5–5.2)
Alkaline Phosphatase: 74 U/L (ref 39–117)
Bilirubin, Direct: 0.2 mg/dL (ref 0.0–0.3)
Total Bilirubin: 0.9 mg/dL (ref 0.2–1.2)
Total Protein: 7 g/dL (ref 6.0–8.3)

## 2023-07-07 NOTE — Progress Notes (Signed)
Liver panel is normal . GGT not done . Can do at next check

## 2023-07-27 ENCOUNTER — Other Ambulatory Visit: Payer: Self-pay | Admitting: Internal Medicine

## 2023-07-27 MED ORDER — AMPHETAMINE-DEXTROAMPHET ER 20 MG PO CP24: 20.0000 mg | ORAL_CAPSULE | ORAL | 0 refills | Status: DC

## 2023-07-27 NOTE — Telephone Encounter (Signed)
Prescription Request  07/27/2023  LOV: 03/15/2023  What is the name of the medication or equipment? amphetamine-dextroamphetamine (ADDERALL XR) 20 MG 24 hr capsule   Have you contacted your pharmacy to request a refill? No   Which pharmacy would you like this sent to?  [id number], [place] - [address] [place] [address] Phone: [phone] Fax: [phone]    Patient notified that their request is being sent to the clinical staff for review and that they should receive a response within 2 business days.   Please advise at Mobile [phone] (mobile)

## 2023-08-28 ENCOUNTER — Telehealth: Payer: Self-pay

## 2023-08-28 ENCOUNTER — Telehealth: Payer: Self-pay | Admitting: Internal Medicine

## 2023-08-28 MED ORDER — AMPHETAMINE-DEXTROAMPHET ER 20 MG PO CP24: 20.0000 mg | ORAL_CAPSULE | ORAL | 0 refills | Status: DC

## 2023-08-28 NOTE — Telephone Encounter (Signed)
Attempted to reach pt to schedule a med check appt. Left a voicemail to call us back.

## 2023-08-28 NOTE — Telephone Encounter (Signed)
I refilled x 1 month

## 2023-08-28 NOTE — Telephone Encounter (Signed)
.  Prescription Request  08/28/2023  LOV: 03/15/2023  What is the name of the medication or equipment? amphetamine-dextroamphetamine (ADDERALL XR) 20 MG 24 hr capsule   Have you contacted your pharmacy to request a refill? No   Which pharmacy would you like this sent to?  [id number], [place] - [address] [place] [address] Phone: [phone] Fax: [phone]    Patient notified that their request is being sent to the clinical staff for review and that they should receive a response within 2 business days.   Please advise at Mobile [phone] (mobile)

## 2023-08-29 NOTE — Telephone Encounter (Signed)
Spoke to pt. Pt is aware.

## 2023-08-30 ENCOUNTER — Telehealth: Payer: Self-pay

## 2023-08-30 ENCOUNTER — Other Ambulatory Visit: Payer: Self-pay | Admitting: Internal Medicine

## 2023-08-30 MED ORDER — AMPHETAMINE-DEXTROAMPHET ER 20 MG PO CP24: 20.0000 mg | ORAL_CAPSULE | ORAL | 0 refills | Status: DC

## 2023-08-30 NOTE — Telephone Encounter (Signed)
Encounter pt at the check in desk .  Pt reports the pharmacy that Rx was previously sent doesn't have the adderall in stock.   [facility] [address] [facility]   Pt requested a written Rx to be printed so that he can take it with him.   Rx was printed and signed by Dr. Fabian Sharp.   Written Rx was handed to pt.

## 2023-09-28 ENCOUNTER — Telehealth: Payer: Self-pay | Admitting: Internal Medicine

## 2023-09-28 NOTE — Telephone Encounter (Signed)
Prescription Request  09/28/2023  LOV: 03/15/2023  What is the name of the medication or equipment? amphetamine-dextroamphetamine amphetamine-dextroamphetamine (ADDERALL XR) 20 MG 24 hr capsule  Have you contacted your pharmacy to request a refill? No   Which pharmacy would you like this sent to?  [id number], [place] - [address] [place] [address] Phone: [phone] Fax: [phone]    Patient notified that their request is being sent to the clinical staff for review and that they should receive a response within 2 business days.   Please advise at Mobile [phone] (mobile)

## 2023-10-02 ENCOUNTER — Other Ambulatory Visit: Payer: Self-pay | Admitting: Family

## 2023-10-02 MED ORDER — AMPHETAMINE-DEXTROAMPHET ER 20 MG PO CP24: 20.0000 mg | ORAL_CAPSULE | ORAL | 0 refills | Status: DC

## 2023-10-31 ENCOUNTER — Other Ambulatory Visit: Payer: Self-pay | Admitting: Internal Medicine

## 2023-10-31 MED ORDER — AMPHETAMINE-DEXTROAMPHET ER 20 MG PO CP24: 20.0000 mg | ORAL_CAPSULE | ORAL | 0 refills | Status: DC

## 2023-10-31 NOTE — Telephone Encounter (Signed)
Prescription Request  10/31/2023  LOV: 03/15/2023  What is the name of the medication or equipment?  amphetamine-dextroamphetamine (ADDERALL XR) 20 MG 24 hr capsule  Have you contacted your pharmacy to request a refill? No   Which pharmacy would you like this sent to?  [id number], [place] - [address] [place] [address] Phone: [phone] Fax: [phone]    Patient notified that their request is being sent to the clinical staff for review and that they should receive a response within 2 business days.   Please advise at Mobile [phone] (mobile)

## 2023-12-04 ENCOUNTER — Telehealth: Payer: Self-pay | Admitting: Internal Medicine

## 2023-12-04 ENCOUNTER — Other Ambulatory Visit: Payer: Self-pay | Admitting: Family

## 2023-12-04 MED ORDER — AMPHETAMINE-DEXTROAMPHET ER 20 MG PO CP24: 20.0000 mg | ORAL_CAPSULE | ORAL | 0 refills | Status: DC

## 2023-12-04 NOTE — Telephone Encounter (Signed)
Prescription Request  12/04/2023  LOV: 03/15/2023  What is the name of the medication or equipment? amphetamine-dextroamphetamine (ADDERALL XR) 20 MG 24 hr capsule   Have you contacted your pharmacy to request a refill? No   Which pharmacy would you like this sent to?  [id number], [place] - [address] [place] [address] Phone: [phone] Fax: [phone]    Patient notified that their request is being sent to the clinical staff for review and that they should receive a response within 2 business days.   Please advise at Mobile [phone] (mobile)

## 2024-01-09 ENCOUNTER — Telehealth: Payer: Self-pay | Admitting: Internal Medicine

## 2024-01-09 NOTE — Telephone Encounter (Signed)
Copied from CRM [id number]. Topic: Clinical - Medication Refill >> Jan 09, 2024  9:39 AM Marica Otter wrote: Most Recent Primary Care Visit:  Provider: [facility]  Department: [facility]  Visit Type: LAB  Date: 07/03/2023  Medication: amphetamine-dextroamphetamine (ADDERALL XR) 20 MG 24 hr capsule  Has the patient contacted their pharmacy? No, Controlled substance (Agent: If no, request that the patient contact the pharmacy for the refill. If patient does not wish to contact the pharmacy document the reason why and proceed with request.) (Agent: If yes, when and what did the pharmacy advise?)  Is this the correct pharmacy for this prescription? Yes If no, delete pharmacy and type the correct one.  This is the patient's preferred pharmacy:  [id number], [place] - [address] [place] [address] Phone: [phone] Fax: [phone]   Has the prescription been filled recently? Yes, 11/2023  Is the patient out of the medication? No  Has the patient been seen for an appointment in the last year OR does the patient have an upcoming appointment?   Can we respond through MyChart?   Agent: Please be advised that Rx refills may take up to 3 business days. We ask that you follow-up with your pharmacy.

## 2024-01-09 NOTE — Telephone Encounter (Signed)
Duplicate request. Medication request already routed to office earlier this morning.   Copied from CRM [id number]. Topic: Clinical - Medication Refill >> Jan 09, 2024  9:39 AM Marica Otter wrote: Most Recent Primary Care Visit:  Provider: [facility]  Department: [facility]  Visit Type: LAB  Date: 07/03/2023  Medication: amphetamine-dextroamphetamine (ADDERALL XR) 20 MG 24 hr capsule  Has the patient contacted their pharmacy? No, Controlled substance (Agent: If no, request that the patient contact the pharmacy for the refill. If patient does not wish to contact the pharmacy document the reason why and proceed with request.) (Agent: If yes, when and what did the pharmacy advise?)  Is this the correct pharmacy for this prescription? Yes If no, delete pharmacy and type the correct one.  This is the patient's preferred pharmacy:  [id number], [place] - [address] [place] [address] Phone: [phone] Fax: [phone]   Has the prescription been filled recently? Yes, 11/2023  Is the patient out of the medication? No  Has the patient been seen for an appointment in the last year OR does the patient have an upcoming appointment? Yes  Can we respond through MyChart? No  Agent: Please be advised that Rx refills may take up to 3 business days. We ask that you follow-up with your pharmacy.

## 2024-01-15 ENCOUNTER — Other Ambulatory Visit: Payer: Self-pay

## 2024-01-15 MED ORDER — AMPHETAMINE-DEXTROAMPHET ER 20 MG PO CP24: 20.0000 mg | ORAL_CAPSULE | ORAL | 0 refills | Status: DC

## 2024-01-15 NOTE — Telephone Encounter (Signed)
Pt checking on progress of refill. Advised provider is [facility] on Mondays, half day Thursdays and Fridays and will return tomorrow. Says he is leaving town this afternoon and asking is another provider would be willing to assist with refill.

## 2024-01-15 NOTE — Telephone Encounter (Signed)
Copied from CRM [id number]. Topic: Clinical - Prescription Issue >> Jan 15, 2024  9:32 AM Hector Shade B wrote: Reason for CRM: patient is requesting an update on his medication refill for the amphetamine-dextroamphetamine (ADDERALL XR) 20 MG 24 hr capsule [id number] .

## 2024-01-16 NOTE — Telephone Encounter (Signed)
Attempted to reach pt.  Left a detail message that Rx is sent to his pharmacy and apologize for the delay. To call us back when if have any questions.

## 2024-01-18 ENCOUNTER — Other Ambulatory Visit: Payer: Self-pay | Admitting: Family

## 2024-02-14 ENCOUNTER — Other Ambulatory Visit: Payer: Self-pay | Admitting: Internal Medicine

## 2024-02-14 MED ORDER — AMPHETAMINE-DEXTROAMPHET ER 20 MG PO CP24: 20.0000 mg | ORAL_CAPSULE | ORAL | 0 refills | Status: DC

## 2024-02-14 NOTE — Telephone Encounter (Signed)
 Copied from CRM [id number]. Topic: Clinical - Medication Refill >> Feb 14, 2024 10:06 AM Corin V wrote: Most Recent Primary Care Visit:  Provider: [facility]  [facility]: [facility]  Visit Type: LAB  Date: 07/03/2023  Medication: amphetamine-dextroamphetamine (ADDERALL XR) 20 MG 24 hr capsule  Has the patient contacted their pharmacy? Yes (Agent: If no, request that the patient contact the pharmacy for the refill. If patient does not wish to contact the pharmacy document the reason why and proceed with request.) (Agent: If yes, when and what did the pharmacy advise?)  Is this the correct pharmacy for this prescription? Yes If no, delete pharmacy and type the correct one.  This is the patient's preferred pharmacy:  [id number] [place], [place] - [address] [place] [address] Phone: [phone] Fax: [phone]   Has the prescription been filled recently? No  Is the patient out of the medication? Yes  Has the patient been seen for an appointment in the last year OR does the patient have an upcoming appointment? Yes  Can we respond through MyChart? No  Agent: Please be advised that Rx refills may take up to 3 business days. We ask that you follow-up with your pharmacy.

## 2024-03-12 ENCOUNTER — Telehealth: Payer: Self-pay

## 2024-03-12 NOTE — Telephone Encounter (Signed)
 Copied from CRM [id number]. Topic: Clinical - Medication Question >> Mar 12, 2024 10:40 AM [place] E wrote: Reason for CRM: Patient called in questioning if PCP can send in another refill of his amphetamine-dextroamphetamine (ADDERALL XR) 20 MG 24 hr capsule medication. Patient stated this medication has to get called in every month.

## 2024-03-13 MED ORDER — AMPHETAMINE-DEXTROAMPHET ER 20 MG PO CP24: 20.0000 mg | ORAL_CAPSULE | ORAL | 0 refills | Status: DC

## 2024-03-13 NOTE — Addendum Note (Signed)
 Addended byMadelin Headings on: 03/13/2024 06:04 PM   Modules accepted: Orders

## 2024-03-13 NOTE — Telephone Encounter (Signed)
 Pt LOV was on 03/15/23 Last refill was done on 02/14/24 for 30 tablets Please advise

## 2024-03-13 NOTE — Telephone Encounter (Signed)
 Make appt due   And I will send in 30 days  infuture please pend the refill request   and confirm pharmacy

## 2024-03-14 NOTE — Telephone Encounter (Signed)
 Left detailed message for pt to pick up Rx from his pharmacy

## 2024-03-14 NOTE — Telephone Encounter (Signed)
 Duplicate encounter

## 2024-03-18 NOTE — Telephone Encounter (Signed)
 Noted.

## 2024-03-22 ENCOUNTER — Other Ambulatory Visit: Payer: Self-pay | Admitting: Internal Medicine

## 2024-03-22 MED ORDER — AMLODIPINE BESYLATE-VALSARTAN 5-160 MG PO TABS: 1.0000 | ORAL_TABLET | Freq: Every day | ORAL | 2 refills | Status: DC

## 2024-03-22 NOTE — Telephone Encounter (Signed)
 Copied from CRM [id number]. Topic: Clinical - Medication Refill >> Mar 22, 2024 12:41 PM [place] wrote: Most Recent Primary Care Visit:  Provider: [facility]  Department: [facility]  Visit Type: LAB  Date: 07/03/2023  Medication: amLODipine-valsartan (EXFORGE) 5-160 MG tablet  Has the patient contacted their pharmacy? Yes (Agent: If no, request that the patient contact the pharmacy for the refill. If patient does not wish to contact the pharmacy document the reason why and proceed with request.) (Agent: If yes, when and what did the pharmacy advise?)  Is this the correct pharmacy for this prescription? Yes If no, delete pharmacy and type the correct one.  This is the patient's preferred pharmacy:  [id number] [place], [place] - [address] [place] [address] Phone: [phone] Fax: [phone]   Has the prescription been filled recently? Yes  Is the patient out of the medication? Yes  Has the patient been seen for an appointment in the last year OR does the patient have an upcoming appointment? Yes  Can we respond through MyChart? Yes  Agent: Please be advised that Rx refills may take up to 3 business days. We ask that you follow-up with your pharmacy.

## 2024-04-09 ENCOUNTER — Other Ambulatory Visit: Payer: Self-pay | Admitting: Internal Medicine

## 2024-04-12 ENCOUNTER — Other Ambulatory Visit: Payer: Self-pay | Admitting: Internal Medicine

## 2024-04-12 NOTE — Telephone Encounter (Signed)
 Copied from CRM [id number]. Topic: Clinical - Prescription Issue >> Apr 12, 2024 11:11 AM Chuck Crater wrote: Reason for CRM: Patient is wanting an update on refill for amphetamine -dextroamphetamine  (ADDERALL  XR) 20 MG 24 hr capsule. Please contact patient.

## 2024-04-15 MED ORDER — AMPHETAMINE-DEXTROAMPHET ER 20 MG PO CP24: 20.0000 mg | ORAL_CAPSULE | ORAL | 0 refills | Status: DC

## 2024-04-22 NOTE — Progress Notes (Deleted)
 No chief complaint on file.   HPI: Barry Silva 63 y.o. come in for Chronic disease management   last visit 3 2024 ADD/ADHD BP  ROS: See pertinent positives and negatives per HPI.  Past Medical History:  Diagnosis Date   Abnormal LFTs 12/01/2014   neg serologies avoid reg liver toxins and repeat in 2 months consider abd US   other labs if needed    ADD (attention deficit disorder with hyperactivity)    formal evaluation   Basal cell carcinoma (BCC) 10/2019   nasal bridge   Complication of anesthesia    hives after surgery 30 years ago and pt wakes up cold   Fracture    femur 1984 residual nerve damage leg leg   History of kidney stones    Hypertension    Migraine    MIGRAINE HEADACHE 08/30/2007   Qualifier: Diagnosis of  By: Ena Harries, RN, Willetta Harpin     OSA (obstructive sleep apnea)    mild, Apnealink 7 2011 RDI7, O2 nadir 83%   Refusal of blood transfusions as patient is Jehovah's Witness    Sarcoidosis    bronchoscopy 2006, mtx started 04/19/10 try off 11/29/10 (atypical cp/ ? worsening ct chest [facility]) off prednisone  7/11    Family History  Problem Relation Age of Onset   Stroke Other        Grandfather father older age   Colon cancer Neg Hx    Esophageal cancer Neg Hx    Rectal cancer Neg Hx    Stomach cancer Neg Hx     Social History   Socioeconomic History   Marital status: Married    Spouse name: Not on file   Number of children: Not on file   Years of education: Not on file   Highest education level: Not on file  Occupational History   Not on file  Tobacco Use   Smoking status: Never   Smokeless tobacco: Never  Vaping Use   Vaping status: Never Used  Substance and Sexual Activity   Alcohol use: Yes    Alcohol/week: 6.0 standard drinks of alcohol    Types: 6 Glasses of wine per week    Comment: socially occ glss of wine or beer at nite   Drug use: No   Sexual activity: Not on file  Other Topics Concern   Not on file  Social History Narrative    Married   HH of 2-3   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   Family in [place]   No tobacco or ETS   Social Drivers of Corporate investment banker Strain: Not on file  Food Insecurity: Not on file  Transportation Needs: Not on file  Physical Activity: Not on file  Stress: Not on file  Social Connections: Unknown (05/03/2022)   Received from [facility]   Social Network    Social Network: Not on file    Outpatient Medications Prior to Visit  Medication Sig Dispense Refill   amLODipine -valsartan  (EXFORGE ) 5-160 MG tablet Take 1 tablet by mouth daily. Medication change dc amlodipine  and losartan  30 tablet 2   amphetamine -dextroamphetamine  (ADDERALL  XR) 20 MG 24 hr capsule Take 1 capsule (20 mg total) by mouth every morning. 30 capsule 0   Ascorbic Acid (VITAMIN C ) 1000 MG tablet Take 1,000 mg by mouth daily.     CANNABIDIOL PO Take 50 mLs by mouth daily.     HYDROcodone -acetaminophen  (NORCO/VICODIN) 5-325 MG tablet Take 1 tablet by  mouth every 6 (six) hours as needed for moderate pain.     ibuprofen (ADVIL) 200 MG tablet Take 200-400 mg by mouth every 6 (six) hours as needed for mild pain.     rosuvastatin  (CRESTOR ) 10 MG tablet Take 1 tablet (10 mg total) by mouth daily. 90 tablet 1   tamsulosin  (FLOMAX ) 0.4 MG CAPS capsule Take 0.4 mg by mouth daily.     No facility-administered medications prior to visit.     EXAM:  There were no vitals taken for this visit.  There is no height or weight on file to calculate BMI.  GENERAL: vitals reviewed and listed above, alert, oriented, appears well hydrated and in no acute distress HEENT: atraumatic, conjunctiva  clear, no obvious abnormalities on inspection of external nose and ears OP : no lesion edema or exudate  NECK: no obvious masses on inspection palpation  LUNGS: clear to auscultation bilaterally, no wheezes, rales or rhonchi, good air movement CV: HRRR, no clubbing cyanosis or  peripheral edema nl cap  refill  MS: moves all extremities without noticeable focal  abnormality PSYCH: pleasant and cooperative, no obvious depression or anxiety Lab Results  Component Value Date   WBC 5.8 04/06/2023   HGB 15.8 04/06/2023   HCT 45.9 04/06/2023   PLT 272 04/06/2023   GLUCOSE 109 (H) 04/06/2023   CHOL 209 (H) 10/25/2022   TRIG 140.0 10/25/2022   HDL 54.70 10/25/2022   LDLDIRECT 142.6 05/14/2013   LDLCALC 127 (H) 10/25/2022   ALT 25 07/03/2023   AST 23 07/03/2023   NA 136 04/06/2023   K 3.6 04/06/2023   CL 103 04/06/2023   CREATININE 1.02 04/06/2023   BUN 15 04/06/2023   CO2 24 04/06/2023   TSH 1.45 10/21/2019   PSA 1.06 10/25/2022   INR 0.96 11/02/2018   HGBA1C 5.4 10/25/2022   BP Readings from Last 3 Encounters:  04/11/23 122/76  04/06/23 (!) 126/98  03/15/23 124/80    ASSESSMENT AND PLAN:  Discussed the following assessment and plan:  No diagnosis found. Due for lab update  to include ggt   -Patient advised to return or notify health care team  if  new concerns arise.  There are no Patient Instructions on file for this visit.   Payson Evrard K. Yul Diana M.D.

## 2024-04-23 ENCOUNTER — Ambulatory Visit: Admitting: Internal Medicine

## 2024-04-23 DIAGNOSIS — Z79899 Other long term (current) drug therapy: Secondary | ICD-10-CM

## 2024-04-23 DIAGNOSIS — F988 Other specified behavioral and emotional disorders with onset usually occurring in childhood and adolescence: Secondary | ICD-10-CM

## 2024-04-23 DIAGNOSIS — D869 Sarcoidosis, unspecified: Secondary | ICD-10-CM

## 2024-04-23 DIAGNOSIS — R748 Abnormal levels of other serum enzymes: Secondary | ICD-10-CM

## 2024-04-23 DIAGNOSIS — I10 Essential (primary) hypertension: Secondary | ICD-10-CM

## 2024-05-14 ENCOUNTER — Ambulatory Visit: Admitting: Internal Medicine

## 2024-05-14 NOTE — Progress Notes (Deleted)
 No chief complaint on file.   HPI: Barry Silva 63 y.o. come in for Chronic disease management   HT ADHD Sarcoid Hx of renal stone recent  Elevated ggt follow    ROS: See pertinent positives and negatives per HPI.  Past Medical History:  Diagnosis Date   Abnormal LFTs 12/01/2014   neg serologies avoid reg liver toxins and repeat in 2 months consider abd US   other labs if needed    ADD (attention deficit disorder with hyperactivity)    formal evaluation   Basal cell carcinoma (BCC) 10/2019   nasal bridge   Complication of anesthesia    hives after surgery 30 years ago and pt wakes up cold   Fracture    femur 1984 residual nerve damage leg leg   History of kidney stones    Hypertension    Migraine    MIGRAINE HEADACHE 08/30/2007   Qualifier: Diagnosis of  By: Ena Harries, RN, Willetta Harpin     OSA (obstructive sleep apnea)    mild, Apnealink 7 2011 RDI7, O2 nadir 83%   Refusal of blood transfusions as patient is Jehovah's Witness    Sarcoidosis    bronchoscopy 2006, mtx started 04/19/10 try off 11/29/10 (atypical cp/ ? worsening ct chest [facility]) off prednisone  7/11    Family History  Problem Relation Age of Onset   Stroke Other        Grandfather father older age   Colon cancer Neg Hx    Esophageal cancer Neg Hx    Rectal cancer Neg Hx    Stomach cancer Neg Hx     Social History   Socioeconomic History   Marital status: Married    Spouse name: Not on file   Number of children: Not on file   Years of education: Not on file   Highest education level: Not on file  Occupational History   Not on file  Tobacco Use   Smoking status: Never   Smokeless tobacco: Never  Vaping Use   Vaping status: Never Used  Substance and Sexual Activity   Alcohol use: Yes    Alcohol/week: 6.0 standard drinks of alcohol    Types: 6 Glasses of wine per week    Comment: socially occ glss of wine or beer at nite   Drug use: No   Sexual activity: Not on file  Other Topics Concern   Not on  file  Social History Narrative   Married   HH of 2-3   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   Family in [place]   No tobacco or ETS   Social Drivers of Corporate investment banker Strain: Not on file  Food Insecurity: Not on file  Transportation Needs: Not on file  Physical Activity: Not on file  Stress: Not on file  Social Connections: Unknown (05/03/2022)   Received [facility]   Social Network    Social Network: Not on file    Outpatient Medications Prior to Visit  Medication Sig Dispense Refill   amLODipine -valsartan  (EXFORGE ) 5-160 MG tablet Take 1 tablet by mouth daily. Medication change dc amlodipine  and losartan  30 tablet 2   amphetamine -dextroamphetamine  (ADDERALL  XR) 20 MG 24 hr capsule Take 1 capsule (20 mg total) by mouth every morning. 30 capsule 0   Ascorbic Acid (VITAMIN C ) 1000 MG tablet Take 1,000 mg by mouth daily.     CANNABIDIOL PO Take 50 mLs by mouth daily.     HYDROcodone -acetaminophen  (  NORCO/VICODIN) 5-325 MG tablet Take 1 tablet by mouth every 6 (six) hours as needed for moderate pain.     ibuprofen (ADVIL) 200 MG tablet Take 200-400 mg by mouth every 6 (six) hours as needed for mild pain.     rosuvastatin  (CRESTOR ) 10 MG tablet Take 1 tablet (10 mg total) by mouth daily. 90 tablet 1   tamsulosin  (FLOMAX ) 0.4 MG CAPS capsule Take 0.4 mg by mouth daily.     No facility-administered medications prior to visit.     EXAM:  There were no vitals taken for this visit.  There is no height or weight on file to calculate BMI.  GENERAL: vitals reviewed and listed above, alert, oriented, appears well hydrated and in no acute distress HEENT: atraumatic, conjunctiva  clear, no obvious abnormalities on inspection of external nose and ears OP : no lesion edema or exudate  NECK: no obvious masses on inspection palpation  LUNGS: clear to auscultation bilaterally, no wheezes, rales or rhonchi, good air movement CV: HRRR, no clubbing  cyanosis or  peripheral edema nl cap refill  MS: moves all extremities without noticeable focal  abnormality PSYCH: pleasant and cooperative, no obvious depression or anxiety Lab Results  Component Value Date   WBC 5.8 04/06/2023   HGB 15.8 04/06/2023   HCT 45.9 04/06/2023   PLT 272 04/06/2023   GLUCOSE 109 (H) 04/06/2023   CHOL 209 (H) 10/25/2022   TRIG 140.0 10/25/2022   HDL 54.70 10/25/2022   LDLDIRECT 142.6 05/14/2013   LDLCALC 127 (H) 10/25/2022   ALT 25 07/03/2023   AST 23 07/03/2023   NA 136 04/06/2023   K 3.6 04/06/2023   CL 103 04/06/2023   CREATININE 1.02 04/06/2023   BUN 15 04/06/2023   CO2 24 04/06/2023   TSH 1.45 10/21/2019   PSA 1.06 10/25/2022   INR 0.96 11/02/2018   HGBA1C 5.4 10/25/2022   BP Readings from Last 3 Encounters:  04/11/23 122/76  04/06/23 (!) 126/98  03/15/23 124/80  Reveiwed ct scan for renal stonefrom 2024  Cyst heaptic lobe but otherwise nl  Lower chest bronchietesis? Inguinal hernias  aortic calcifications Renal stones  ASSESSMENT AND PLAN:  Discussed the following assessment and plan:  No diagnosis found. ? PTH  Also due for updated labs  -Patient advised to return or notify health care team  if  new concerns arise.  There are no Patient Instructions on file for this visit.   Daylani Deblois K. Wilene Pharo M.D.

## 2024-05-23 ENCOUNTER — Encounter: Payer: Self-pay | Admitting: Family Medicine

## 2024-05-23 ENCOUNTER — Ambulatory Visit (INDEPENDENT_AMBULATORY_CARE_PROVIDER_SITE_OTHER): Admitting: Family Medicine

## 2024-05-23 VITALS — BP 132/86 | HR 85 | Temp 98.3°F | Ht 67.0 in | Wt 163.0 lb

## 2024-05-23 DIAGNOSIS — Z7689 Persons encountering health services in other specified circumstances: Secondary | ICD-10-CM | POA: Diagnosis not present

## 2024-05-23 DIAGNOSIS — F909 Attention-deficit hyperactivity disorder, unspecified type: Secondary | ICD-10-CM | POA: Diagnosis not present

## 2024-05-23 DIAGNOSIS — Z79899 Other long term (current) drug therapy: Secondary | ICD-10-CM | POA: Diagnosis not present

## 2024-05-23 DIAGNOSIS — Z1211 Encounter for screening for malignant neoplasm of colon: Secondary | ICD-10-CM | POA: Diagnosis not present

## 2024-05-23 DIAGNOSIS — I10 Essential (primary) hypertension: Secondary | ICD-10-CM | POA: Diagnosis not present

## 2024-05-23 DIAGNOSIS — E663 Overweight: Secondary | ICD-10-CM | POA: Diagnosis not present

## 2024-05-23 MED ORDER — AMPHETAMINE-DEXTROAMPHET ER 20 MG PO CP24: 20.0000 mg | ORAL_CAPSULE | ORAL | 0 refills | Status: DC

## 2024-05-23 NOTE — Assessment & Plan Note (Signed)
 Controlled. Continue with Amlodipine /Valsartan  5-160mg  daily. Ordered CMP to assess kidney function.

## 2024-05-23 NOTE — Patient Instructions (Addendum)
-  It was nice to meet you and look forward to taking care of you. -Placed a referral to GI for colonoscopy. -Ordered labs. Office will call with lab results and you will see them on MyChart.  -Blood pressure stable. Continue with Amlodipine /Valsartan  5-160mg  daily.  -Will take over medication management for Adderall  XR 20mg  daily. Ordered urine drug screen and will need to be completed periodically. Signed controlled substance contract and will need to be renewed every year.  -Follow up in 3 months.

## 2024-05-23 NOTE — Progress Notes (Signed)
 New Patient Office Visit  Subjective   Patient ID: TAVARIS EUDY, male    DOB: 1961/03/30  Age: 63 y.o. MRN: [id number]  CC:  Chief Complaint  Patient presents with   Establish Care    HPI Barry Silva presents to establish care with new provider.   Patients previous primary care provider: [facility] [place] with Dr. Daphine Eagle. Last seen in 2024.   Specialist: Chiropractor: Dr. Linton Richter   WUJ:[id number]. Patient is prescribed Adderall  XR 20mg  daily. He reports he takes medication most days. He can tell a difference when he does not take it. Effective.   HTN: Chronic. Patient is prescribed Amlodipine /Valsartan  5-160mg  daily. Patient reports he monitors his blood pressure sometimes at home. Denies CP, SHOB, HA, dizziness, lightheadedness, or lower extremity.   Outpatient Encounter Medications as of 05/23/2024  Medication Sig   amLODipine -valsartan  (EXFORGE ) 5-160 MG tablet Take 1 tablet by mouth daily. Medication change dc amlodipine  and losartan    Ascorbic Acid (VITAMIN C ) 1000 MG tablet Take 1,000 mg by mouth daily.   ibuprofen (ADVIL) 200 MG tablet Take 200-400 mg by mouth every 6 (six) hours as needed for mild pain.   [DISCONTINUED] amphetamine -dextroamphetamine  (ADDERALL  XR) 20 MG 24 hr capsule Take 1 capsule (20 mg total) by mouth every morning.   amphetamine -dextroamphetamine  (ADDERALL  XR) 20 MG 24 hr capsule Take 1 capsule (20 mg total) by mouth every morning.   [DISCONTINUED] CANNABIDIOL PO Take 50 mLs by mouth daily.   [DISCONTINUED] HYDROcodone -acetaminophen  (NORCO/VICODIN) 5-325 MG tablet Take 1 tablet by mouth every 6 (six) hours as needed for moderate pain.   [DISCONTINUED] rosuvastatin  (CRESTOR ) 10 MG tablet Take 1 tablet (10 mg total) by mouth daily.   [DISCONTINUED] tamsulosin  (FLOMAX ) 0.4 MG CAPS capsule Take 0.4 mg by mouth daily.   No facility-administered encounter medications on file as of 05/23/2024.    Past Medical History:   Diagnosis Date   Abnormal LFTs 12/01/2014   neg serologies avoid reg liver toxins and repeat in 2 months consider abd US   other labs if needed    ADD (attention deficit disorder with hyperactivity)    formal evaluation   Basal cell carcinoma (BCC) 10/2019   nasal bridge   Complication of anesthesia    hives after surgery 30 years ago and pt wakes up cold   Fracture    femur 1984 residual nerve damage leg leg   History of kidney stones    Hypertension    Migraine    MIGRAINE HEADACHE 08/30/2007   Qualifier: Diagnosis of  By: Ena Harries, RN, Willetta Harpin     OSA (obstructive sleep apnea)    mild, Apnealink 7 2011 RDI7, O2 nadir 83%   Refusal of blood transfusions as patient is Jehovah's Witness    Sarcoidosis    bronchoscopy 2006, mtx started 04/19/10 try off 11/29/10 (atypical cp/ ? worsening ct chest w[facility]) off prednisone  7/11    Past Surgical History:  Procedure Laterality Date   BRONCHOSCOPY  2006   COLONOSCOPY     CYSTOSCOPY WITH RETROGRADE PYELOGRAM, URETEROSCOPY AND STENT PLACEMENT Bilateral 04/11/2023   Procedure: CYSTOSCOPY WITH RETROGRADE PYELOGRAM, URETEROSCOPY AND STENT PLACEMENT;  Surgeon: Sherlyn Ditto, MD;  Location: [facility];  Service: Urology;  Laterality: Bilateral;  1 HR   EXTERNAL FIXATION LEG Bilateral 10/26/2018   Procedure: LEFT LEGEXTERNAL FIXATION .Aaron AasSPLINT CHANGE RIGHT LEG;  Surgeon: Laneta Pintos, MD;  Location: [facility];  Service: Orthopedics;  Laterality: Bilateral;   EXTERNAL FIXATION REMOVAL  11/02/2018   Procedure: REMOVAL EXTERNAL FIXATION LEG;  Surgeon: Laneta Pintos, MD;  Location: [facility];  Service: Orthopedics;;   HARDWARE REMOVAL Right 06/04/2019   Procedure: RIGHT FOOT HARDWARE REMOVAL;  Surgeon: Donnamarie Gables, MD;  Location: [facility];  Service: Orthopedics;  Laterality: Right;   HERNIA REPAIR     age 67   HOLMIUM LASER APPLICATION Bilateral 04/11/2023   Procedure: HOLMIUM LASER APPLICATION;  Surgeon: Sherlyn Ditto, MD;  Location:  [facility];  Service: Urology;  Laterality: Bilateral;   KIDNEY STONE SURGERY     cystocopy   KNEE SURGERY  1985 and 1986   getting knee to bend- had a previous fracture   left leg femur fracture with rod      OPEN REDUCTION INTERNAL FIXATION (ORIF) TIBIA/FIBULA FRACTURE Left 11/02/2018   Procedure: OPEN REDUCTION INTERNAL FIXATION (ORIF) LEFT PILON FRACTURE;  Surgeon: Laneta Pintos, MD;  Location: [facility];  Service: Orthopedics;  Laterality: Left;   ORIF CALCANEOUS FRACTURE Right 11/02/2018   Procedure: OPEN REDUCTION INTERNAL FIXATION (ORIF) CALCANEOUS FRACTURE;  Surgeon: Laneta Pintos, MD;  Location: [facility];  Service: Orthopedics;  Laterality: Right;   TENDON EXPLORATION Right 06/04/2019   Procedure: EXPLORATION AND REPAIR  PERONEAL TENDONS;  Surgeon: Donnamarie Gables, MD;  Location: [facility];  Service: Orthopedics;  Laterality: Right;   XI ROBOTIC ASSISTED INGUINAL HERNIA REPAIR WITH MESH Left 11/19/2020   Procedure: XI ROBOTIC ASSISTED LEFT INGUINAL HERNIA REPAIR WITH MESH;  Surgeon: Aldean Hummingbird, MD;  Location: [facility];  Service: General;  Laterality: Left;    Family History  Problem Relation Age of Onset   Stroke Paternal Grandfather    Colon cancer Neg Hx    Esophageal cancer Neg Hx    Rectal cancer Neg Hx    Stomach cancer Neg Hx     Social History   Socioeconomic History   Marital status: Married    Spouse name: Not on file   Number of children: 0   Years of education: Not on file   Highest education level: Associate degree: occupational, Scientist, product/process development, or vocational program  Occupational History   Not on file  Tobacco Use   Smoking status: Never   Smokeless tobacco: Never  Vaping Use   Vaping status: Never Used  Substance and Sexual Activity   Alcohol use: Yes    Alcohol/week: 6.0 standard drinks of alcohol    Types: 6 Glasses of wine per week    Comment: socially occ glass of wine or beer at night   Drug use: No   Sexual activity: Not Currently   Other Topics Concern   Not on file  Social History Narrative   Married   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   Family in [place] and [place]   No tobacco or ETS   Social Drivers of Corporate investment banker Strain: Low Risk  (05/23/2024)   Overall Financial Resource Strain (CARDIA)    Difficulty of Paying Living Expenses: Not hard at all  Food Insecurity: No Food Insecurity (05/23/2024)   Hunger Vital Sign    Worried About Running Out of Food in the Last Year: Never true    Ran Out of Food in the Last Year: Never true  Transportation Needs: No Transportation Needs (05/23/2024)   PRAPARE - Administrator, Civil Service (Medical): No    Lack of Transportation (Non-Medical): No  Physical Activity: Sufficiently Active (05/23/2024)  Exercise Vital Sign    Days of Exercise per Week: 5 days    Minutes of Exercise per Session: 30 min  Stress: No Stress Concern Present (05/23/2024)   [employer] of Occupational Health - Occupational Stress Questionnaire    Feeling of Stress : Not at all  Social Connections: Moderately Integrated (05/23/2024)   Social Connection and Isolation Panel [NHANES]    Frequency of Communication with Friends and Family: More than three times a week    Frequency of Social Gatherings with Friends and Family: Three times a week    Attends Religious Services: 1 to 4 times per year    Active Member of Clubs or Organizations: No    Attends Banker Meetings: Never    Marital Status: Married  Catering manager Violence: Not At Risk (05/23/2024)   Humiliation, Afraid, Rape, and Kick questionnaire    Fear of Current or Ex-Partner: No    Emotionally Abused: No    Physically Abused: No    Sexually Abused: No    ROS See HPI above    Objective  BP 132/86   Pulse 85   Temp 98.3 F (36.8 C) (Oral)   Ht 5\' 7"  (1.702 m)   Wt 163 lb (73.9 kg)   SpO2 97%   BMI 25.53 kg/m   Physical Exam Vitals reviewed.  Constitutional:      General: He  is not in acute distress.    Appearance: Normal appearance. He is not ill-appearing, toxic-appearing or diaphoretic.  HENT:     Head: Normocephalic and atraumatic.  Eyes:     General:        Right eye: No discharge.        Left eye: No discharge.     Conjunctiva/sclera: Conjunctivae normal.  Cardiovascular:     Rate and Rhythm: Normal rate and regular rhythm.     Heart sounds: Normal heart sounds. No murmur heard.    No friction rub. No gallop.  Pulmonary:     Effort: Pulmonary effort is normal. No respiratory distress.     Breath sounds: Normal breath sounds.  Musculoskeletal:        General: Normal range of motion.  Skin:    General: Skin is warm and dry.  Neurological:     General: No focal deficit present.     Mental Status: He is alert and oriented to person, place, and time. Mental status is at baseline.  Psychiatric:        Mood and Affect: Mood normal.        Behavior: Behavior normal.        Thought Content: Thought content normal.        Judgment: Judgment normal.      Assessment & Plan:  Attention deficit hyperactivity disorder (ADHD), unspecified ADHD type Assessment & Plan: Will take over medication management for Adderall  XR 20mg  daily. Ordered urine drug screen and will need to be completed periodically. Signed controlled substance contract and will need to be renewed every year. PDMP reviewed. Last refill was on 04/15/2024. Refilled medication.   Orders: -     DRUG MONITOR, PANEL 1, SCREEN, URINE -     Amphetamine -Dextroamphet ER; Take 1 capsule (20 mg total) by mouth every morning.  Dispense: 30 capsule; Refill: 0  Colon cancer screening -     Ambulatory referral to Gastroenterology  Overweight (BMI 25.0-29.9) -     CBC with Differential/Platelet -     Comprehensive metabolic panel with GFR -  Hemoglobin A1c -     Lipid panel -     TSH  Primary hypertension Assessment & Plan: Controlled. Continue with Amlodipine /Valsartan  5-160mg  daily. Ordered  CMP to assess kidney function.   Orders: -     Comprehensive metabolic panel with GFR  Encounter to establish care   1.Referral to health maintenance:  -Colonoscopy: Referral placed to GI  -Covid booster: Declines  -HIV screening: Declines  -Zoster vaccine: Declines  2.Ordered labs (CBC, CMP, A1c, Lipid Panel, and TSH) based on BMI-overweight and HTN.   Return in about 3 months (around 08/23/2024) for chronic management.   Delila Kuklinski, NP

## 2024-05-23 NOTE — Assessment & Plan Note (Signed)
 Will take over medication management for Adderall  XR 20mg  daily. Ordered urine drug screen and will need to be completed periodically. Signed controlled substance contract and will need to be renewed every year. PDMP reviewed. Last refill was on 04/15/2024. Refilled medication.

## 2024-05-24 ENCOUNTER — Ambulatory Visit: Payer: Self-pay | Admitting: Family Medicine

## 2024-05-24 LAB — COMPREHENSIVE METABOLIC PANEL WITH GFR
ALT: 32 U/L (ref 0–53)
AST: 24 U/L (ref 0–37)
Albumin: 4.4 g/dL (ref 3.5–5.2)
Alkaline Phosphatase: 73 U/L (ref 39–117)
BUN: 19 mg/dL (ref 6–23)
CO2: 28 meq/L (ref 19–32)
Calcium: 9.9 mg/dL (ref 8.4–10.5)
Chloride: 103 meq/L (ref 96–112)
Creatinine, Ser: 1 mg/dL (ref 0.40–1.50)
GFR: 81.42 mL/min (ref >60.00)
Glucose, Bld: 73 mg/dL (ref 70–99)
Potassium: 4.4 meq/L (ref 3.5–5.1)
Sodium: 138 meq/L (ref 135–145)
Total Bilirubin: 0.8 mg/dL (ref 0.2–1.2)
Total Protein: 7 g/dL (ref 6.0–8.3)

## 2024-05-24 LAB — LIPID PANEL
Cholesterol: 216 mg/dL — ABNORMAL HIGH (ref 0–200)
HDL: 65.1 mg/dL (ref >39.00)
LDL Cholesterol: 129 mg/dL — ABNORMAL HIGH (ref 0–99)
NonHDL: 150.84
Total CHOL/HDL Ratio: 3
Triglycerides: 109 mg/dL (ref 0.0–149.0)
VLDL: 21.8 mg/dL (ref 0.0–40.0)

## 2024-05-24 LAB — DRUG MONITOR, PANEL 1, SCREEN, URINE
Amphetamines: POSITIVE ng/mL — AB (ref <500)
Barbiturates: NEGATIVE ng/mL (ref <300)
Benzodiazepines: NEGATIVE ng/mL (ref <100)
Cocaine Metabolite: NEGATIVE ng/mL (ref <150)
Creatinine: 131.7 mg/dL (ref >=20.0)
Marijuana Metabolite: NEGATIVE ng/mL (ref <20)
Methadone Metabolite: NEGATIVE ng/mL (ref <100)
Opiates: NEGATIVE ng/mL (ref <100)
Oxidant: NEGATIVE ug/mL (ref <200)
Oxycodone: NEGATIVE ng/mL (ref <100)
Phencyclidine: NEGATIVE ng/mL (ref <25)
pH: 6.3 (ref 4.5–9.0)

## 2024-05-24 LAB — CBC WITH DIFFERENTIAL/PLATELET
Basophils Absolute: 0 10*3/uL (ref 0.0–0.1)
Basophils Relative: 0.1 % (ref 0.0–3.0)
Eosinophils Absolute: 0.1 10*3/uL (ref 0.0–0.7)
Eosinophils Relative: 0.9 % (ref 0.0–5.0)
HCT: 43.2 % (ref 39.0–52.0)
Hemoglobin: 15 g/dL (ref 13.0–17.0)
Lymphocytes Relative: 25.8 % (ref 12.0–46.0)
Lymphs Abs: 1.4 10*3/uL (ref 0.7–4.0)
MCHC: 34.6 g/dL (ref 30.0–36.0)
MCV: 90.9 fl (ref 78.0–100.0)
Monocytes Absolute: 0.7 10*3/uL (ref 0.1–1.0)
Monocytes Relative: 13.1 % — ABNORMAL HIGH (ref 3.0–12.0)
Neutro Abs: 3.3 10*3/uL (ref 1.4–7.7)
Neutrophils Relative %: 60.1 % (ref 43.0–77.0)
Platelets: 306 10*3/uL (ref 150.0–400.0)
RBC: 4.75 Mil/uL (ref 4.22–5.81)
RDW: 13.5 % (ref 11.5–15.5)
WBC: 5.5 10*3/uL (ref 4.0–10.5)

## 2024-05-24 LAB — DM TEMPLATE

## 2024-05-24 LAB — HEMOGLOBIN A1C: Hgb A1c MFr Bld: 5.3 % (ref 4.6–6.5)

## 2024-05-27 LAB — TSH: TSH: 0.91 u[IU]/mL (ref 0.35–5.50)

## 2024-06-24 ENCOUNTER — Other Ambulatory Visit: Payer: Self-pay | Admitting: Family Medicine

## 2024-06-24 ENCOUNTER — Other Ambulatory Visit: Payer: Self-pay | Admitting: Internal Medicine

## 2024-06-24 DIAGNOSIS — F909 Attention-deficit hyperactivity disorder, unspecified type: Secondary | ICD-10-CM

## 2024-06-24 MED ORDER — AMPHETAMINE-DEXTROAMPHET ER 20 MG PO CP24: 20.0000 mg | ORAL_CAPSULE | ORAL | 0 refills | Status: DC

## 2024-06-24 NOTE — Progress Notes (Signed)
 Patient requesting a refill on medication. PDMP reviewed. Last refill was on 05/23/2024. UDS and controlled substance contract UTD.

## 2024-07-19 ENCOUNTER — Encounter: Payer: Self-pay | Admitting: Family Medicine

## 2024-07-24 ENCOUNTER — Other Ambulatory Visit: Payer: Self-pay | Admitting: Family Medicine

## 2024-07-24 DIAGNOSIS — F909 Attention-deficit hyperactivity disorder, unspecified type: Secondary | ICD-10-CM

## 2024-07-24 MED ORDER — AMPHETAMINE-DEXTROAMPHET ER 20 MG PO CP24: 20.0000 mg | ORAL_CAPSULE | ORAL | 0 refills | Status: DC

## 2024-07-24 NOTE — Telephone Encounter (Signed)
 Copied from CRM [id number]. Topic: Clinical - Medication Refill >> Jul 24, 2024  9:05 AM Deaijah H wrote: Medication: amphetamine -dextroamphetamine  (ADDERALL  XR) 20 MG 24 hr capsule  Has the patient contacted their pharmacy? No (Agent: If no, request that the patient contact the pharmacy for the refill. If patient does not wish to contact the pharmacy document the reason why and proceed with request.) Stated they always tell him to contact Dr (Agent: If yes, when and what did the pharmacy advise?)  This is the patient's preferred pharmacy:  [id number] [address] - [address] [place] [address] Phone: [phone] Fax: [phone]  Is this the correct pharmacy for this prescription? Yes If no, delete pharmacy and type the correct one.   Has the prescription been filled recently? Yes  Is the patient out of the medication? No, 2 left  Has the patient been seen for an appointment in the last year OR does the patient have an upcoming appointment? Yes  Can we respond through MyChart? Yes  Agent: Please be advised that Rx refills may take up to 3 business days. We ask that you follow-up with your pharmacy.

## 2024-08-26 ENCOUNTER — Other Ambulatory Visit: Payer: Self-pay | Admitting: Family Medicine

## 2024-08-26 DIAGNOSIS — F909 Attention-deficit hyperactivity disorder, unspecified type: Secondary | ICD-10-CM

## 2024-08-26 MED ORDER — AMPHETAMINE-DEXTROAMPHET ER 20 MG PO CP24: 20.0000 mg | ORAL_CAPSULE | ORAL | 0 refills | Status: DC

## 2024-08-26 NOTE — Telephone Encounter (Signed)
 Copied from CRM #[id number]. Topic: Clinical - Medication Refill >> Aug 26, 2024  9:27 AM Thersia C wrote: Medication: amphetamine -dextroamphetamine  (ADDERALL  XR) 20 MG 24 hr capsule   Has the patient contacted their pharmacy? Yes (Agent: If no, request that the patient contact the pharmacy for the refill. If patient does not wish to contact the pharmacy document the reason why and proceed with request.) (Agent: If yes, when and what did the pharmacy advise?)  This is the patient's preferred pharmacy:  [id number] [address] [facility] - [address] [place] [address] Phone: [phone] Fax: [phone]  Is this the correct pharmacy for this prescription? Yes If no, delete pharmacy and type the correct one.   Has the prescription been filled recently? No  Is the patient out of the medication? Yes  Has the patient been seen for an appointment in the last year OR does the patient have an upcoming appointment? Yes  Can we respond through MyChart? Yes  Agent: Please be advised that Rx refills may take up to 3 business days. We ask that you follow-up with your pharmacy.

## 2024-08-28 ENCOUNTER — Ambulatory Visit (INDEPENDENT_AMBULATORY_CARE_PROVIDER_SITE_OTHER): Admitting: Family Medicine

## 2024-08-28 ENCOUNTER — Encounter: Payer: Self-pay | Admitting: Family Medicine

## 2024-08-28 VITALS — BP 138/92 | HR 85 | Temp 98.0°F | Ht 67.0 in | Wt 160.0 lb

## 2024-08-28 DIAGNOSIS — I10 Essential (primary) hypertension: Secondary | ICD-10-CM | POA: Diagnosis not present

## 2024-08-28 DIAGNOSIS — F909 Attention-deficit hyperactivity disorder, unspecified type: Secondary | ICD-10-CM

## 2024-08-28 NOTE — Assessment & Plan Note (Signed)
 Effective. Continue  Adderall  XR 20mg  daily. Ordered urine drug screen. Controlled substance contract UTD. PDMP reviewed. Last refill was on 08/06. However, medication was sent in to pharmacy on 09/08, but not picked up medication.

## 2024-08-28 NOTE — Patient Instructions (Signed)
-  It was good to see you today.  -Continue taking Adderall  XR 20mg  daily. Ordered urine drug screen for management of medication. Office will call with results and will be available via MyChart.  -Elevated blood pressure today, more likely from not taking medication as consistently recently. Continue with Amlodipine /Valsartan  5-160mg  daily. Recommend to monitor blood pressure at home. If it consistently stays above 140/90, follow up sooner.  -Provided GI referral letter to schedule appointment for colonoscopy. -Discussed about lipid results from previous visit. Prefers to work on life style modifications and not take cholesterol medication.  -Follow up in 3 months.

## 2024-08-28 NOTE — Assessment & Plan Note (Addendum)
 Elevated BP today, more likely with not taking medication consistently recently.  Continue with Amlodipine /Valsartan  5-160mg  daily. Recommend to monitor blood pressure at home. If it consistently stays above 140/90, follow up sooner.

## 2024-08-28 NOTE — Progress Notes (Signed)
   Established Patient Office Visit   Subjective:  Patient ID: Barry Silva, male    DOB: [birth year]  Age: 63 y.o. MRN: [id number]  Chief Complaint  Patient presents with   Medical Management of Chronic Issues    3 month follow up     HPI Barry Silva. Patient is prescribed Adderall  XR 20mg  daily. He reports he takes medication most days. He can tell a difference when he does not take it. Effective.    HTN: Chronic. Patient is prescribed Amlodipine /Valsartan  5-160mg  daily. Patient reports he monitors his blood pressure sometimes at home. Denies CP, SHOB, HA, dizziness, lightheadedness, or lower extremity. He reports he has not took his medication in about 3-4 days, until this morning due to being off his regular routine. Been volunteering.   ROS See HPI above     Objective:   BP (!) 138/92   Pulse 85   Temp 98 F (36.7 C) (Oral)   Ht 5' 7 (1.702 m)   Wt 160 lb (72.6 kg)   SpO2 96%   BMI 25.06 kg/m    Physical Exam Vitals reviewed.  Constitutional:      General: He is not in acute distress.    Appearance: Normal appearance. He is not ill-appearing, toxic-appearing or diaphoretic.  Eyes:     General:        Right eye: No discharge.        Left eye: No discharge.     Conjunctiva/sclera: Conjunctivae normal.  Cardiovascular:     Rate and Rhythm: Normal rate.  Pulmonary:     Effort: Pulmonary effort is normal. No respiratory distress.  Musculoskeletal:        General: Normal range of motion.  Skin:    General: Skin is warm and dry.  Neurological:     General: No focal deficit present.     Mental Status: He is alert and oriented to person, place, and time. Mental status is at baseline.  Psychiatric:        Mood and Affect: Mood normal.        Behavior: Behavior normal.        Thought Content: Thought content normal.        Judgment: Judgment normal.      Assessment & Plan:  Attention deficit hyperactivity disorder (ADHD), unspecified ADHD type Assessment &  Plan: Effective. Continue  Adderall  XR 20mg  daily. Ordered urine drug screen. Controlled substance contract UTD. PDMP reviewed. Last refill was on 08/06. However, medication was sent in to pharmacy on 09/08, but not picked up medication.     Orders: -     DRUG MONITOR, PANEL 1, SCREEN, URINE  Primary hypertension Assessment & Plan: Elevated BP today, more likely with not taking medication consistently recently.  Continue with Amlodipine /Valsartan  5-160mg  daily. Recommend to monitor blood pressure at home. If it consistently stays above 140/90, follow up sooner.    -Provided patient the GI referral letter to schedule appointment for colonoscopy. -Declined influenza vaccine.  -Discussed about lipid results from previous visit. Prefers to work on life style modifications and not take cholesterol medication.  Return in about 3 months (around 11/27/2024) for chronic management.   Barry Ion, NP

## 2024-08-29 LAB — DRUG MONITOR, PANEL 1, SCREEN, URINE
Amphetamines: POSITIVE ng/mL — AB (ref <500)
Barbiturates: NEGATIVE ng/mL (ref <300)
Benzodiazepines: NEGATIVE ng/mL (ref <100)
Cocaine Metabolite: NEGATIVE ng/mL (ref <150)
Creatinine: 104.6 mg/dL (ref >=20.0)
Marijuana Metabolite: NEGATIVE ng/mL (ref <20)
Methadone Metabolite: NEGATIVE ng/mL (ref <100)
Opiates: NEGATIVE ng/mL (ref <100)
Oxidant: NEGATIVE ug/mL (ref <200)
Oxycodone: NEGATIVE ng/mL (ref <100)
Phencyclidine: NEGATIVE ng/mL (ref <25)
pH: 7 (ref 4.5–9.0)

## 2024-08-29 LAB — DM TEMPLATE

## 2024-08-30 ENCOUNTER — Ambulatory Visit: Payer: Self-pay | Admitting: Family Medicine

## 2024-09-26 ENCOUNTER — Other Ambulatory Visit: Payer: Self-pay | Admitting: Family Medicine

## 2024-09-26 DIAGNOSIS — F909 Attention-deficit hyperactivity disorder, unspecified type: Secondary | ICD-10-CM

## 2024-09-26 MED ORDER — AMPHETAMINE-DEXTROAMPHET ER 20 MG PO CP24: 20.0000 mg | ORAL_CAPSULE | ORAL | 0 refills | Status: DC

## 2024-09-26 NOTE — Telephone Encounter (Signed)
 Copied from CRM [id number]. Topic: Clinical - Medication Refill >> Sep 26, 2024 10:00 AM Rosina BIRCH wrote: Medication: amphetamine -dextroamphetamine  (ADDERALL  XR) 20 MG 24 hr capsule  Has the patient contacted their pharmacy? No (Agent: If no, request that the patient contact the pharmacy for the refill. If patient does not wish to contact the pharmacy document the reason why and proceed with request.) (Agent: If yes, when and what did the pharmacy advise?)  This is the patient's preferred pharmacy:  [id number] [address] [place] [facility] - [address] [place] [address] Phone: [phone] Fax: [phone]  Is this the correct pharmacy for this prescription? Yes If no, delete pharmacy and type the correct one.   Has the prescription been filled recently? Yes  Is the patient out of the medication? Yes  Has the patient been seen for an appointment in the last year OR does the patient have an upcoming appointment? Yes  Can we respond through MyChart? Yes  Agent: Please be advised that Rx refills may take up to 3 business days. We ask that you follow-up with your pharmacy.

## 2024-10-22 ENCOUNTER — Other Ambulatory Visit: Payer: Self-pay | Admitting: Family Medicine

## 2024-10-28 ENCOUNTER — Other Ambulatory Visit: Payer: Self-pay | Admitting: Family Medicine

## 2024-10-28 DIAGNOSIS — F909 Attention-deficit hyperactivity disorder, unspecified type: Secondary | ICD-10-CM

## 2024-10-28 NOTE — Telephone Encounter (Signed)
 Copied from CRM [id number]. Topic: Clinical - Medication Refill >> Oct 28, 2024 12:11 PM Pinkey ORN wrote: Medication: amphetamine -dextroamphetamine  (ADDERALL  XR) 20 MG 24 hr capsule  Has the patient contacted their pharmacy? Yes (Agent: If no, request that the patient contact the pharmacy for the refill. If patient does not wish to contact the pharmacy document the reason why and proceed with request.) (Agent: If yes, when and what did the pharmacy advise?)  This is the patient's preferred pharmacy:  [id number] [address] [facility] - [address] [place] [address] Phone: [phone] Fax: [phone]  Is this the correct pharmacy for this prescription? Yes If no, delete pharmacy and type the correct one.   Has the prescription been filled recently? Yes  Is the patient out of the medication? No  Has the patient been seen for an appointment in the last year OR does the patient have an upcoming appointment? Yes  Can we respond through MyChart? No, prefer phone call.  Agent: Please be advised that Rx refills may take up to 3 business days. We ask that you follow-up with your pharmacy.

## 2024-10-30 MED ORDER — AMPHETAMINE-DEXTROAMPHET ER 20 MG PO CP24: 20.0000 mg | ORAL_CAPSULE | ORAL | 0 refills | Status: DC

## 2024-11-13 DIAGNOSIS — H43811 Vitreous degeneration, right eye: Secondary | ICD-10-CM | POA: Diagnosis not present

## 2024-11-26 ENCOUNTER — Other Ambulatory Visit: Payer: Self-pay | Admitting: Family Medicine

## 2024-11-26 DIAGNOSIS — F909 Attention-deficit hyperactivity disorder, unspecified type: Secondary | ICD-10-CM

## 2024-11-26 MED ORDER — AMPHETAMINE-DEXTROAMPHET ER 20 MG PO CP24: 20.0000 mg | ORAL_CAPSULE | ORAL | 0 refills | Status: DC

## 2024-11-26 NOTE — Telephone Encounter (Signed)
 Copied from CRM #[id number]. Topic: Clinical - Medication Refill >> Nov 26, 2024 12:03 PM Paige D wrote: Medication: amphetamine -dextroamphetamine  (ADDERALL  XR) 20 MG 24 hr capsule  Has the patient contacted their pharmacy? No (Agent: If no, request that the patient contact the pharmacy for the refill. If patient does not wish to contact the pharmacy document the reason why and proceed with request.) (Agent: If yes, when and what did the pharmacy advise?)  This is the patient's preferred pharmacy:   [id number] [address] [facility] - [address] [place] [address] Phone: [phone] Fax: [phone]  Is this the correct pharmacy for this prescription? Yes If no, delete pharmacy and type the correct one.   Has the prescription been filled recently? Last fill 10/30/24   Is the patient out of the medication? No has a few days   Has the patient been seen for an appointment in the last year OR does the patient have an upcoming appointment? Yes  Can we respond through MyChart? No, prefers phone call   Agent: Please be advised that Rx refills may take up to 3 business days. We ask that you follow-up with your pharmacy.

## 2024-11-28 ENCOUNTER — Encounter: Payer: Self-pay | Admitting: Family Medicine

## 2024-11-28 ENCOUNTER — Ambulatory Visit (INDEPENDENT_AMBULATORY_CARE_PROVIDER_SITE_OTHER): Admitting: Family Medicine

## 2024-11-28 VITALS — BP 130/88 | HR 90 | Temp 97.7°F | Ht 67.0 in | Wt 163.0 lb

## 2024-11-28 DIAGNOSIS — F909 Attention-deficit hyperactivity disorder, unspecified type: Secondary | ICD-10-CM | POA: Diagnosis not present

## 2024-11-28 DIAGNOSIS — I10 Essential (primary) hypertension: Secondary | ICD-10-CM

## 2024-11-28 DIAGNOSIS — M25562 Pain in left knee: Secondary | ICD-10-CM | POA: Diagnosis not present

## 2024-11-28 MED ORDER — AMPHETAMINE-DEXTROAMPHET ER 20 MG PO CP24: 20.0000 mg | ORAL_CAPSULE | ORAL | 0 refills | Status: AC

## 2024-11-28 NOTE — Assessment & Plan Note (Signed)
 Blood pressure stable today. Continue Amlodipine /Valsartan  5-160mg  daily.
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
## 2024-11-28 NOTE — Assessment & Plan Note (Signed)
 Effective. Continue  Adderall  XR 20mg  daily. Ordered urine drug screen. Controlled substance contract UTD(05/23/2024). PDMP reviewed. Last refill was on 12/09. However, medication was sent in to pharmacy, but could not be picked up. Then, today he got a message that his pharmacy does not have the medication. [facility] to [facility] [address].

## 2024-11-28 NOTE — Progress Notes (Signed)
 Barry Silva                                          MRN: [id number]   11/28/2024   The [facility] Quality Team Specialist reviewed this patient medical record for the purposes of chart review for care gap closure. The following were reviewed: abstraction for care gap closure-controlling blood pressure.    [facility] Quality Team

## 2024-11-28 NOTE — Patient Instructions (Addendum)
-  Blood pressure stable today. Continue Amlodipine /Valsartan  5-160mg  daily.  -Ordered urine drug screen for management of Adderall . Resent Adderall  to another pharmacy.  -Placed a referral to [facility] for left knee pain. Please call the office or send a MyChart message if you do not receive a phone call or a MyChart message about appointment in 2 weeks.  -Follow up in 3 months for chronic management.

## 2024-11-29 ENCOUNTER — Ambulatory Visit: Payer: Self-pay | Admitting: Family Medicine

## 2024-11-29 LAB — DRUG MONITOR, PANEL 1, SCREEN, URINE
Amphetamines: POSITIVE ng/mL — AB (ref <500)
Barbiturates: NEGATIVE ng/mL (ref <300)
Benzodiazepines: NEGATIVE ng/mL (ref <100)
Cocaine Metabolite: NEGATIVE ng/mL (ref <150)
Creatinine: 190 mg/dL (ref >=20.0)
Marijuana Metabolite: NEGATIVE ng/mL (ref <20)
Methadone Metabolite: NEGATIVE ng/mL (ref <100)
Opiates: NEGATIVE ng/mL (ref <100)
Oxidant: NEGATIVE ug/mL (ref <200)
Oxycodone: NEGATIVE ng/mL (ref <100)
Phencyclidine: NEGATIVE ng/mL (ref <25)
pH: 7.4 (ref 4.5–9.0)

## 2024-11-29 LAB — DM TEMPLATE

## 2025-01-07 ENCOUNTER — Telehealth: Payer: Self-pay

## 2025-01-07 ENCOUNTER — Other Ambulatory Visit: Payer: Self-pay | Admitting: Family Medicine

## 2025-01-07 DIAGNOSIS — F909 Attention-deficit hyperactivity disorder, unspecified type: Secondary | ICD-10-CM

## 2025-01-07 NOTE — Progress Notes (Signed)
 Error. Was going to refill Adderall , not due yet. PDMP reviewed. Last refill was on 01/09 for 30 days.

## 2025-01-07 NOTE — Telephone Encounter (Signed)
 Copied from CRM [id number]. Topic: Clinical - Medication Refill >> Jan 07, 2025 11:21 AM Nessti S wrote: Medication: amphetamine -dextroamphetamine  (ADDERALL  XR) 20 MG 24 hr capsule   Has the patient contacted their pharmacy? No (Agent: If no, request that the patient contact the pharmacy for the refill. If patient does not wish to contact the pharmacy document the reason why and proceed with request.) (Agent: If yes, when and what did the pharmacy advise?)  This is the patient's preferred pharmacy:  [id number] [address] [place] [place] - [address] [place] [address] Phone: [phone] Fax: [phone]  Is this the correct pharmacy for this prescription? Yes If no, delete pharmacy and type the correct one.   Has the prescription been filled recently? No  Is the patient out of the medication? No. Has few left  Has the patient been seen for an appointment in the last year OR does the patient have an upcoming appointment? Yes  Can we respond through MyChart? No  Agent: Please be advised that Rx refills may take up to 3 business days. We ask that you follow-up with your pharmacy.
